# Patient Record
Sex: Female | Born: 1970 | Race: White | Hispanic: No | Marital: Married | Smoking: Current every day smoker
Health system: Southern US, Community
[De-identification: ages and names within clinical notes are randomized; demographics above are authoritative.]

## PROBLEM LIST (undated history)

## (undated) DIAGNOSIS — C801 Malignant (primary) neoplasm, unspecified: Secondary | ICD-10-CM

## (undated) DIAGNOSIS — Z87898 Personal history of other specified conditions: Secondary | ICD-10-CM

## (undated) DIAGNOSIS — F32A Depression, unspecified: Secondary | ICD-10-CM

## (undated) DIAGNOSIS — J441 Chronic obstructive pulmonary disease with (acute) exacerbation: Secondary | ICD-10-CM

## (undated) DIAGNOSIS — G378 Other specified demyelinating diseases of central nervous system: Secondary | ICD-10-CM

## (undated) DIAGNOSIS — B20 Human immunodeficiency virus [HIV] disease: Secondary | ICD-10-CM

## (undated) DIAGNOSIS — F4310 Post-traumatic stress disorder, unspecified: Secondary | ICD-10-CM

## (undated) DIAGNOSIS — B182 Chronic viral hepatitis C: Secondary | ICD-10-CM

## (undated) DIAGNOSIS — R0602 Shortness of breath: Secondary | ICD-10-CM

## (undated) DIAGNOSIS — T8859XA Other complications of anesthesia, initial encounter: Secondary | ICD-10-CM

## (undated) DIAGNOSIS — R112 Nausea with vomiting, unspecified: Secondary | ICD-10-CM

## (undated) DIAGNOSIS — F419 Anxiety disorder, unspecified: Secondary | ICD-10-CM

## (undated) DIAGNOSIS — F17200 Nicotine dependence, unspecified, uncomplicated: Secondary | ICD-10-CM

## (undated) DIAGNOSIS — Z9889 Other specified postprocedural states: Secondary | ICD-10-CM

## (undated) DIAGNOSIS — T4145XA Adverse effect of unspecified anesthetic, initial encounter: Secondary | ICD-10-CM

## (undated) DIAGNOSIS — F329 Major depressive disorder, single episode, unspecified: Secondary | ICD-10-CM

## (undated) HISTORY — PX: FOOT SURGERY: SHX648

## (undated) HISTORY — DX: Chronic obstructive pulmonary disease with (acute) exacerbation: J44.1

## (undated) HISTORY — DX: Nicotine dependence, unspecified, uncomplicated: F17.200

## (undated) HISTORY — PX: OTHER SURGICAL HISTORY: SHX169

## (undated) HISTORY — DX: Human immunodeficiency virus (HIV) disease: B20

## (undated) HISTORY — DX: Chronic viral hepatitis C: B18.2

## (undated) HISTORY — PX: VAGINA SURGERY: SHX829

## (undated) HISTORY — DX: Personal history of other specified conditions: Z87.898

---

## 1996-09-13 DIAGNOSIS — Z21 Asymptomatic human immunodeficiency virus [HIV] infection status: Secondary | ICD-10-CM

## 1996-09-13 DIAGNOSIS — B20 Human immunodeficiency virus [HIV] disease: Secondary | ICD-10-CM

## 1996-09-13 HISTORY — DX: Human immunodeficiency virus (HIV) disease: B20

## 1996-09-13 HISTORY — DX: Asymptomatic human immunodeficiency virus (hiv) infection status: Z21

## 1998-02-08 ENCOUNTER — Encounter (INDEPENDENT_AMBULATORY_CARE_PROVIDER_SITE_OTHER): Payer: Self-pay | Admitting: *Deleted

## 1998-02-08 LAB — CONVERTED CEMR LAB
CD4 Count: 49 microliters
CD4 T Cell Abs: 49

## 1998-02-27 ENCOUNTER — Encounter: Admission: RE | Admit: 1998-02-27 | Discharge: 1998-02-27 | Payer: Self-pay | Admitting: Infectious Diseases

## 1998-03-14 ENCOUNTER — Encounter: Admission: RE | Admit: 1998-03-14 | Discharge: 1998-03-14 | Payer: Self-pay | Admitting: Internal Medicine

## 1998-03-15 ENCOUNTER — Inpatient Hospital Stay (HOSPITAL_COMMUNITY): Admission: EM | Admit: 1998-03-15 | Discharge: 1998-03-18 | Payer: Self-pay | Admitting: Emergency Medicine

## 1998-03-24 ENCOUNTER — Encounter: Admission: RE | Admit: 1998-03-24 | Discharge: 1998-03-24 | Payer: Self-pay | Admitting: Hematology and Oncology

## 1998-03-31 ENCOUNTER — Encounter: Admission: RE | Admit: 1998-03-31 | Discharge: 1998-03-31 | Payer: Self-pay | Admitting: Infectious Diseases

## 1998-05-12 ENCOUNTER — Encounter: Admission: RE | Admit: 1998-05-12 | Discharge: 1998-05-12 | Payer: Self-pay | Admitting: Infectious Diseases

## 1998-05-30 ENCOUNTER — Encounter: Admission: RE | Admit: 1998-05-30 | Discharge: 1998-08-28 | Payer: Self-pay | Admitting: Dentistry

## 1998-06-23 ENCOUNTER — Ambulatory Visit (HOSPITAL_COMMUNITY): Admission: RE | Admit: 1998-06-23 | Discharge: 1998-06-23 | Payer: Self-pay | Admitting: Infectious Diseases

## 1998-07-07 ENCOUNTER — Encounter: Admission: RE | Admit: 1998-07-07 | Discharge: 1998-07-07 | Payer: Self-pay | Admitting: Infectious Diseases

## 1998-08-25 ENCOUNTER — Encounter: Admission: RE | Admit: 1998-08-25 | Discharge: 1998-08-25 | Payer: Self-pay | Admitting: Infectious Diseases

## 1998-09-13 HISTORY — PX: NECK SURGERY: SHX720

## 1998-10-17 ENCOUNTER — Encounter (HOSPITAL_COMMUNITY): Admission: RE | Admit: 1998-10-17 | Discharge: 1999-01-15 | Payer: Self-pay | Admitting: Dentistry

## 1998-10-20 ENCOUNTER — Ambulatory Visit (HOSPITAL_COMMUNITY): Admission: RE | Admit: 1998-10-20 | Discharge: 1998-10-20 | Payer: Self-pay | Admitting: Infectious Diseases

## 1998-11-05 ENCOUNTER — Encounter: Admission: RE | Admit: 1998-11-05 | Discharge: 1998-11-05 | Payer: Self-pay | Admitting: Infectious Diseases

## 1998-12-17 ENCOUNTER — Encounter: Admission: RE | Admit: 1998-12-17 | Discharge: 1998-12-17 | Payer: Self-pay | Admitting: Infectious Diseases

## 1998-12-17 ENCOUNTER — Ambulatory Visit (HOSPITAL_COMMUNITY): Admission: RE | Admit: 1998-12-17 | Discharge: 1998-12-17 | Payer: Self-pay | Admitting: Infectious Diseases

## 1999-01-19 ENCOUNTER — Inpatient Hospital Stay (HOSPITAL_COMMUNITY): Admission: RE | Admit: 1999-01-19 | Discharge: 1999-01-21 | Payer: Self-pay | Admitting: Infectious Diseases

## 1999-01-19 ENCOUNTER — Encounter: Admission: RE | Admit: 1999-01-19 | Discharge: 1999-01-19 | Payer: Self-pay | Admitting: Infectious Diseases

## 1999-01-19 ENCOUNTER — Encounter: Payer: Self-pay | Admitting: Infectious Diseases

## 1999-03-09 ENCOUNTER — Encounter: Admission: RE | Admit: 1999-03-09 | Discharge: 1999-03-09 | Payer: Self-pay | Admitting: Infectious Diseases

## 1999-03-09 ENCOUNTER — Ambulatory Visit (HOSPITAL_COMMUNITY): Admission: RE | Admit: 1999-03-09 | Discharge: 1999-03-09 | Payer: Self-pay | Admitting: Infectious Diseases

## 1999-04-13 ENCOUNTER — Encounter: Admission: RE | Admit: 1999-04-13 | Discharge: 1999-04-13 | Payer: Self-pay | Admitting: Infectious Diseases

## 1999-06-08 ENCOUNTER — Encounter: Admission: RE | Admit: 1999-06-08 | Discharge: 1999-06-08 | Payer: Self-pay | Admitting: Infectious Diseases

## 1999-06-08 ENCOUNTER — Ambulatory Visit (HOSPITAL_COMMUNITY): Admission: RE | Admit: 1999-06-08 | Discharge: 1999-06-08 | Payer: Self-pay | Admitting: Infectious Diseases

## 1999-07-08 ENCOUNTER — Encounter: Admission: RE | Admit: 1999-07-08 | Discharge: 1999-07-08 | Payer: Self-pay | Admitting: Infectious Diseases

## 1999-07-20 ENCOUNTER — Encounter: Admission: RE | Admit: 1999-07-20 | Discharge: 1999-07-20 | Payer: Self-pay | Admitting: Infectious Diseases

## 1999-08-12 ENCOUNTER — Encounter (INDEPENDENT_AMBULATORY_CARE_PROVIDER_SITE_OTHER): Payer: Self-pay | Admitting: *Deleted

## 1999-08-12 ENCOUNTER — Ambulatory Visit (HOSPITAL_BASED_OUTPATIENT_CLINIC_OR_DEPARTMENT_OTHER): Admission: RE | Admit: 1999-08-12 | Discharge: 1999-08-12 | Payer: Self-pay | Admitting: *Deleted

## 1999-08-31 ENCOUNTER — Encounter: Admission: RE | Admit: 1999-08-31 | Discharge: 1999-08-31 | Payer: Self-pay | Admitting: Infectious Diseases

## 1999-08-31 ENCOUNTER — Ambulatory Visit (HOSPITAL_COMMUNITY): Admission: RE | Admit: 1999-08-31 | Discharge: 1999-08-31 | Payer: Self-pay | Admitting: Infectious Diseases

## 1999-09-28 ENCOUNTER — Encounter: Admission: RE | Admit: 1999-09-28 | Discharge: 1999-09-28 | Payer: Self-pay | Admitting: Infectious Diseases

## 1999-11-03 ENCOUNTER — Ambulatory Visit (HOSPITAL_BASED_OUTPATIENT_CLINIC_OR_DEPARTMENT_OTHER): Admission: RE | Admit: 1999-11-03 | Discharge: 1999-11-03 | Payer: Self-pay | Admitting: *Deleted

## 1999-11-03 ENCOUNTER — Encounter (INDEPENDENT_AMBULATORY_CARE_PROVIDER_SITE_OTHER): Payer: Self-pay | Admitting: *Deleted

## 1999-12-23 ENCOUNTER — Ambulatory Visit (HOSPITAL_COMMUNITY): Admission: RE | Admit: 1999-12-23 | Discharge: 1999-12-23 | Payer: Self-pay | Admitting: Infectious Diseases

## 1999-12-23 ENCOUNTER — Encounter: Admission: RE | Admit: 1999-12-23 | Discharge: 1999-12-23 | Payer: Self-pay | Admitting: Infectious Diseases

## 2000-01-06 ENCOUNTER — Encounter: Admission: RE | Admit: 2000-01-06 | Discharge: 2000-01-06 | Payer: Self-pay | Admitting: Infectious Diseases

## 2000-03-14 ENCOUNTER — Encounter: Admission: RE | Admit: 2000-03-14 | Discharge: 2000-03-14 | Payer: Self-pay | Admitting: Infectious Diseases

## 2000-03-14 ENCOUNTER — Ambulatory Visit (HOSPITAL_COMMUNITY): Admission: RE | Admit: 2000-03-14 | Discharge: 2000-03-14 | Payer: Self-pay | Admitting: Infectious Diseases

## 2000-03-30 ENCOUNTER — Encounter: Admission: RE | Admit: 2000-03-30 | Discharge: 2000-03-30 | Payer: Self-pay | Admitting: Infectious Diseases

## 2000-06-13 ENCOUNTER — Ambulatory Visit (HOSPITAL_COMMUNITY): Admission: RE | Admit: 2000-06-13 | Discharge: 2000-06-13 | Payer: Self-pay | Admitting: Infectious Diseases

## 2000-06-13 ENCOUNTER — Encounter: Admission: RE | Admit: 2000-06-13 | Discharge: 2000-06-13 | Payer: Self-pay | Admitting: Infectious Diseases

## 2000-09-19 ENCOUNTER — Encounter: Admission: RE | Admit: 2000-09-19 | Discharge: 2000-09-19 | Payer: Self-pay | Admitting: Infectious Diseases

## 2000-09-19 ENCOUNTER — Ambulatory Visit (HOSPITAL_COMMUNITY): Admission: RE | Admit: 2000-09-19 | Discharge: 2000-09-19 | Payer: Self-pay | Admitting: Infectious Diseases

## 2000-10-03 ENCOUNTER — Encounter: Admission: RE | Admit: 2000-10-03 | Discharge: 2000-10-03 | Payer: Self-pay | Admitting: Infectious Diseases

## 2000-11-22 ENCOUNTER — Encounter: Admission: RE | Admit: 2000-11-22 | Discharge: 2000-11-22 | Payer: Self-pay | Admitting: Internal Medicine

## 2001-01-16 ENCOUNTER — Encounter: Admission: RE | Admit: 2001-01-16 | Discharge: 2001-01-16 | Payer: Self-pay | Admitting: Internal Medicine

## 2001-03-06 ENCOUNTER — Ambulatory Visit (HOSPITAL_COMMUNITY): Admission: RE | Admit: 2001-03-06 | Discharge: 2001-03-06 | Payer: Self-pay | Admitting: Infectious Diseases

## 2001-03-06 ENCOUNTER — Encounter: Admission: RE | Admit: 2001-03-06 | Discharge: 2001-03-06 | Payer: Self-pay | Admitting: Internal Medicine

## 2001-03-20 ENCOUNTER — Encounter: Admission: RE | Admit: 2001-03-20 | Discharge: 2001-03-20 | Payer: Self-pay | Admitting: Infectious Diseases

## 2001-04-17 ENCOUNTER — Ambulatory Visit (HOSPITAL_COMMUNITY): Admission: RE | Admit: 2001-04-17 | Discharge: 2001-04-17 | Payer: Self-pay | Admitting: Infectious Diseases

## 2001-04-17 ENCOUNTER — Encounter: Admission: RE | Admit: 2001-04-17 | Discharge: 2001-04-17 | Payer: Self-pay | Admitting: Infectious Diseases

## 2001-05-01 ENCOUNTER — Encounter: Admission: RE | Admit: 2001-05-01 | Discharge: 2001-05-01 | Payer: Self-pay | Admitting: Infectious Diseases

## 2001-06-29 ENCOUNTER — Ambulatory Visit (HOSPITAL_COMMUNITY): Admission: RE | Admit: 2001-06-29 | Discharge: 2001-06-29 | Payer: Self-pay | Admitting: Infectious Diseases

## 2001-06-29 ENCOUNTER — Encounter: Admission: RE | Admit: 2001-06-29 | Discharge: 2001-06-29 | Payer: Self-pay | Admitting: Infectious Diseases

## 2001-07-31 ENCOUNTER — Encounter: Admission: RE | Admit: 2001-07-31 | Discharge: 2001-07-31 | Payer: Self-pay | Admitting: Infectious Diseases

## 2001-12-05 ENCOUNTER — Encounter: Admission: RE | Admit: 2001-12-05 | Discharge: 2001-12-05 | Payer: Self-pay | Admitting: Infectious Diseases

## 2001-12-05 ENCOUNTER — Ambulatory Visit (HOSPITAL_COMMUNITY): Admission: RE | Admit: 2001-12-05 | Discharge: 2001-12-05 | Payer: Self-pay | Admitting: Infectious Diseases

## 2002-04-23 ENCOUNTER — Encounter: Admission: RE | Admit: 2002-04-23 | Discharge: 2002-04-23 | Payer: Self-pay | Admitting: Internal Medicine

## 2002-04-23 ENCOUNTER — Ambulatory Visit (HOSPITAL_COMMUNITY): Admission: RE | Admit: 2002-04-23 | Discharge: 2002-04-23 | Payer: Self-pay | Admitting: Internal Medicine

## 2002-07-26 ENCOUNTER — Encounter: Admission: RE | Admit: 2002-07-26 | Discharge: 2002-07-26 | Payer: Self-pay | Admitting: Internal Medicine

## 2002-07-26 ENCOUNTER — Ambulatory Visit (HOSPITAL_COMMUNITY): Admission: RE | Admit: 2002-07-26 | Discharge: 2002-07-26 | Payer: Self-pay | Admitting: Infectious Diseases

## 2002-08-27 ENCOUNTER — Encounter: Admission: RE | Admit: 2002-08-27 | Discharge: 2002-08-27 | Payer: Self-pay | Admitting: Infectious Diseases

## 2002-10-01 ENCOUNTER — Encounter: Admission: RE | Admit: 2002-10-01 | Discharge: 2002-10-01 | Payer: Self-pay | Admitting: Infectious Diseases

## 2002-12-10 ENCOUNTER — Encounter: Admission: RE | Admit: 2002-12-10 | Discharge: 2002-12-10 | Payer: Self-pay | Admitting: Infectious Diseases

## 2003-05-15 ENCOUNTER — Encounter: Admission: RE | Admit: 2003-05-15 | Discharge: 2003-05-15 | Payer: Self-pay | Admitting: Infectious Diseases

## 2003-05-15 ENCOUNTER — Encounter (INDEPENDENT_AMBULATORY_CARE_PROVIDER_SITE_OTHER): Payer: Self-pay | Admitting: Specialist

## 2003-05-15 ENCOUNTER — Ambulatory Visit (HOSPITAL_COMMUNITY): Admission: RE | Admit: 2003-05-15 | Discharge: 2003-05-15 | Payer: Self-pay | Admitting: Infectious Diseases

## 2003-06-17 ENCOUNTER — Encounter: Admission: RE | Admit: 2003-06-17 | Discharge: 2003-06-17 | Payer: Self-pay | Admitting: Infectious Diseases

## 2003-07-18 ENCOUNTER — Encounter: Admission: RE | Admit: 2003-07-18 | Discharge: 2003-07-18 | Payer: Self-pay | Admitting: Infectious Diseases

## 2003-07-22 ENCOUNTER — Ambulatory Visit (HOSPITAL_COMMUNITY): Admission: RE | Admit: 2003-07-22 | Discharge: 2003-07-22 | Payer: Self-pay | Admitting: Infectious Diseases

## 2003-08-12 ENCOUNTER — Encounter: Admission: RE | Admit: 2003-08-12 | Discharge: 2003-08-12 | Payer: Self-pay | Admitting: Infectious Diseases

## 2003-10-16 ENCOUNTER — Encounter: Admission: RE | Admit: 2003-10-16 | Discharge: 2003-10-16 | Payer: Self-pay | Admitting: Infectious Diseases

## 2003-12-04 ENCOUNTER — Encounter: Admission: RE | Admit: 2003-12-04 | Discharge: 2003-12-04 | Payer: Self-pay | Admitting: Infectious Diseases

## 2004-02-27 ENCOUNTER — Inpatient Hospital Stay (HOSPITAL_COMMUNITY): Admission: RE | Admit: 2004-02-27 | Discharge: 2004-02-29 | Payer: Self-pay | Admitting: Psychiatry

## 2004-03-30 ENCOUNTER — Ambulatory Visit (HOSPITAL_COMMUNITY): Admission: RE | Admit: 2004-03-30 | Discharge: 2004-03-30 | Payer: Self-pay | Admitting: Infectious Diseases

## 2004-03-30 ENCOUNTER — Encounter: Admission: RE | Admit: 2004-03-30 | Discharge: 2004-03-30 | Payer: Self-pay | Admitting: Infectious Diseases

## 2004-03-30 ENCOUNTER — Encounter (INDEPENDENT_AMBULATORY_CARE_PROVIDER_SITE_OTHER): Payer: Self-pay | Admitting: *Deleted

## 2004-03-30 LAB — CONVERTED CEMR LAB
CD4 Count: 380 microliters
HIV 1 RNA Quant: 49 copies/mL

## 2005-03-08 ENCOUNTER — Ambulatory Visit: Payer: Self-pay | Admitting: Infectious Diseases

## 2005-03-08 ENCOUNTER — Ambulatory Visit (HOSPITAL_COMMUNITY): Admission: RE | Admit: 2005-03-08 | Discharge: 2005-03-08 | Payer: Self-pay | Admitting: Infectious Diseases

## 2005-03-08 ENCOUNTER — Encounter (INDEPENDENT_AMBULATORY_CARE_PROVIDER_SITE_OTHER): Payer: Self-pay | Admitting: *Deleted

## 2005-03-08 LAB — CONVERTED CEMR LAB
CD4 Count: 300 microliters
HIV 1 RNA Quant: 41400 copies/mL

## 2006-09-19 ENCOUNTER — Encounter: Admission: RE | Admit: 2006-09-19 | Discharge: 2006-09-19 | Payer: Self-pay | Admitting: Infectious Diseases

## 2006-09-19 ENCOUNTER — Encounter (INDEPENDENT_AMBULATORY_CARE_PROVIDER_SITE_OTHER): Payer: Self-pay | Admitting: *Deleted

## 2006-09-19 ENCOUNTER — Ambulatory Visit: Payer: Self-pay | Admitting: Infectious Diseases

## 2006-09-19 LAB — CONVERTED CEMR LAB
ALT: 51 units/L — ABNORMAL HIGH (ref 0–35)
AST: 42 units/L — ABNORMAL HIGH (ref 0–37)
Albumin: 3.9 g/dL (ref 3.5–5.2)
Alkaline Phosphatase: 56 units/L (ref 39–117)
BUN: 9 mg/dL (ref 6–23)
Basophils Absolute: 0 10*3/uL (ref 0.0–0.1)
Basophils Relative: 0 % (ref 0–1)
Bilirubin Urine: NEGATIVE
CD4 Count: 100 microliters
CO2: 24 meq/L (ref 19–32)
Calcium: 9.6 mg/dL (ref 8.4–10.5)
Chloride: 105 meq/L (ref 96–112)
Cholesterol: 160 mg/dL (ref 0–200)
Creatinine, Ser: 0.64 mg/dL (ref 0.40–1.20)
Eosinophils Relative: 1 % (ref 0–5)
Glucose, Bld: 92 mg/dL (ref 70–99)
HCT: 43.7 % (ref 36.0–46.0)
HDL: 42 mg/dL (ref >39)
HIV 1 RNA Quant: 59700 copies/mL
HIV 1 RNA Quant: 59700 copies/mL — ABNORMAL HIGH (ref <50)
HIV-1 RNA Quant, Log: 4.78 — ABNORMAL HIGH (ref <1.70)
Hemoglobin, Urine: NEGATIVE
Hemoglobin: 14.1 g/dL (ref 12.0–15.0)
Ketones, ur: NEGATIVE mg/dL
LDL Cholesterol: 94 mg/dL (ref 0–99)
Leukocytes, UA: NEGATIVE
Lymphocytes Relative: 38 % (ref 12–46)
Lymphs Abs: 2 10*3/uL (ref 0.7–3.3)
MCHC: 32.3 g/dL (ref 30.0–36.0)
MCV: 101.6 fL — ABNORMAL HIGH (ref 78.0–100.0)
Monocytes Absolute: 0.4 10*3/uL (ref 0.2–0.7)
Monocytes Relative: 8 % (ref 3–11)
Neutro Abs: 2.8 10*3/uL (ref 1.7–7.7)
Neutrophils Relative %: 53 % (ref 43–77)
Nitrite: NEGATIVE
Platelets: 249 10*3/uL (ref 150–400)
Potassium: 4.1 meq/L (ref 3.5–5.3)
Protein, ur: NEGATIVE mg/dL
RBC: 4.3 M/uL (ref 3.87–5.11)
RDW: 13.2 % (ref 11.5–14.0)
Sodium: 138 meq/L (ref 135–145)
Specific Gravity, Urine: 1.016 (ref 1.005–1.03)
Total Bilirubin: 0.5 mg/dL (ref 0.3–1.2)
Total CHOL/HDL Ratio: 3.8
Total Protein: 7.4 g/dL (ref 6.0–8.3)
Triglycerides: 120 mg/dL (ref <150)
Urine Glucose: NEGATIVE mg/dL
Urobilinogen, UA: 0.2 (ref 0.0–1.0)
VLDL: 24 mg/dL (ref 0–40)
WBC: 5.2 10*3/uL (ref 4.0–10.5)
pH: 6 (ref 5.0–8.0)

## 2006-10-05 ENCOUNTER — Ambulatory Visit: Payer: Self-pay | Admitting: Internal Medicine

## 2006-10-18 DIAGNOSIS — A158 Other respiratory tuberculosis: Secondary | ICD-10-CM | POA: Insufficient documentation

## 2006-10-19 DIAGNOSIS — L0293 Carbuncle, unspecified: Secondary | ICD-10-CM

## 2006-10-19 DIAGNOSIS — L0292 Furuncle, unspecified: Secondary | ICD-10-CM | POA: Insufficient documentation

## 2006-10-19 DIAGNOSIS — B20 Human immunodeficiency virus [HIV] disease: Secondary | ICD-10-CM | POA: Insufficient documentation

## 2006-10-21 DIAGNOSIS — F339 Major depressive disorder, recurrent, unspecified: Secondary | ICD-10-CM | POA: Insufficient documentation

## 2006-10-24 ENCOUNTER — Encounter (INDEPENDENT_AMBULATORY_CARE_PROVIDER_SITE_OTHER): Payer: Self-pay | Admitting: Infectious Diseases

## 2006-11-07 ENCOUNTER — Encounter (INDEPENDENT_AMBULATORY_CARE_PROVIDER_SITE_OTHER): Payer: Self-pay | Admitting: *Deleted

## 2006-11-07 LAB — CONVERTED CEMR LAB
HCV Quantitative: 451000 intl units/mL
Pap Smear: ABNORMAL

## 2006-11-09 ENCOUNTER — Encounter: Admission: RE | Admit: 2006-11-09 | Discharge: 2006-11-09 | Payer: Self-pay | Admitting: Internal Medicine

## 2006-11-09 ENCOUNTER — Telehealth: Payer: Self-pay

## 2006-11-09 ENCOUNTER — Ambulatory Visit: Payer: Self-pay | Admitting: Internal Medicine

## 2006-11-11 ENCOUNTER — Inpatient Hospital Stay (HOSPITAL_COMMUNITY): Admission: RE | Admit: 2006-11-11 | Discharge: 2006-11-15 | Payer: Self-pay | Admitting: Psychiatry

## 2006-11-11 ENCOUNTER — Ambulatory Visit: Payer: Self-pay | Admitting: Psychiatry

## 2006-11-20 ENCOUNTER — Encounter (INDEPENDENT_AMBULATORY_CARE_PROVIDER_SITE_OTHER): Payer: Self-pay | Admitting: *Deleted

## 2006-12-21 ENCOUNTER — Ambulatory Visit: Payer: Self-pay | Admitting: Internal Medicine

## 2006-12-21 DIAGNOSIS — N920 Excessive and frequent menstruation with regular cycle: Secondary | ICD-10-CM | POA: Insufficient documentation

## 2006-12-21 DIAGNOSIS — R350 Frequency of micturition: Secondary | ICD-10-CM | POA: Insufficient documentation

## 2006-12-21 DIAGNOSIS — D219 Benign neoplasm of connective and other soft tissue, unspecified: Secondary | ICD-10-CM | POA: Insufficient documentation

## 2007-01-19 ENCOUNTER — Telehealth (INDEPENDENT_AMBULATORY_CARE_PROVIDER_SITE_OTHER): Payer: Self-pay | Admitting: *Deleted

## 2007-02-08 ENCOUNTER — Encounter: Payer: Self-pay | Admitting: Internal Medicine

## 2007-02-08 ENCOUNTER — Encounter: Payer: Self-pay | Admitting: Obstetrics and Gynecology

## 2007-02-08 ENCOUNTER — Ambulatory Visit: Payer: Self-pay | Admitting: Obstetrics and Gynecology

## 2007-02-08 ENCOUNTER — Encounter (INDEPENDENT_AMBULATORY_CARE_PROVIDER_SITE_OTHER): Payer: Self-pay | Admitting: *Deleted

## 2007-02-08 LAB — CONVERTED CEMR LAB: Pap Smear: ABNORMAL

## 2007-02-22 ENCOUNTER — Telehealth: Payer: Self-pay | Admitting: Internal Medicine

## 2007-02-27 ENCOUNTER — Ambulatory Visit (HOSPITAL_COMMUNITY): Admission: RE | Admit: 2007-02-27 | Discharge: 2007-02-27 | Payer: Self-pay | Admitting: Internal Medicine

## 2007-04-18 ENCOUNTER — Telehealth: Payer: Self-pay | Admitting: Internal Medicine

## 2007-04-18 ENCOUNTER — Encounter: Payer: Self-pay | Admitting: Internal Medicine

## 2007-04-24 ENCOUNTER — Encounter: Admission: RE | Admit: 2007-04-24 | Discharge: 2007-04-24 | Payer: Self-pay | Admitting: Internal Medicine

## 2007-04-24 ENCOUNTER — Ambulatory Visit: Payer: Self-pay | Admitting: Internal Medicine

## 2007-04-24 LAB — CONVERTED CEMR LAB
ALT: 38 units/L — ABNORMAL HIGH (ref 0–35)
AST: 32 units/L (ref 0–37)
Albumin: 3.9 g/dL (ref 3.5–5.2)
Alkaline Phosphatase: 107 units/L (ref 39–117)
BUN: 10 mg/dL (ref 6–23)
Basophils Absolute: 0 10*3/uL (ref 0.0–0.1)
Basophils Relative: 0 % (ref 0–1)
CO2: 23 meq/L (ref 19–32)
Calcium: 8.6 mg/dL (ref 8.4–10.5)
Chloride: 108 meq/L (ref 96–112)
Creatinine, Ser: 0.66 mg/dL (ref 0.40–1.20)
Eosinophils Absolute: 0.1 10*3/uL (ref 0.0–0.7)
Eosinophils Relative: 2 % (ref 0–5)
Glucose, Bld: 94 mg/dL (ref 70–99)
HCT: 46.5 % — ABNORMAL HIGH (ref 36.0–46.0)
HIV 1 RNA Quant: <50 copies/mL (ref <50)
HIV-1 RNA Quant, Log: <1.7 (ref <1.70)
Hemoglobin: 15.6 g/dL — ABNORMAL HIGH (ref 12.0–15.0)
Lymphocytes Relative: 39 % (ref 12–46)
Lymphs Abs: 2.2 10*3/uL (ref 0.7–3.3)
MCHC: 33.5 g/dL (ref 30.0–36.0)
MCV: 103.3 fL — ABNORMAL HIGH (ref 78.0–100.0)
Monocytes Absolute: 0.5 10*3/uL (ref 0.2–0.7)
Monocytes Relative: 9 % (ref 3–11)
Neutro Abs: 2.9 10*3/uL (ref 1.7–7.7)
Neutrophils Relative %: 51 % (ref 43–77)
Platelets: 390 10*3/uL (ref 150–400)
Potassium: 4.4 meq/L (ref 3.5–5.3)
RBC: 4.5 M/uL (ref 3.87–5.11)
RDW: 13.4 % (ref 11.5–14.0)
Sodium: 139 meq/L (ref 135–145)
Total Bilirubin: 0.3 mg/dL (ref 0.3–1.2)
Total Protein: 7.4 g/dL (ref 6.0–8.3)
WBC: 5.7 10*3/uL (ref 4.0–10.5)

## 2007-04-28 ENCOUNTER — Encounter (INDEPENDENT_AMBULATORY_CARE_PROVIDER_SITE_OTHER): Payer: Self-pay | Admitting: *Deleted

## 2007-04-28 ENCOUNTER — Telehealth: Payer: Self-pay | Admitting: Internal Medicine

## 2007-05-03 ENCOUNTER — Ambulatory Visit: Payer: Self-pay | Admitting: Internal Medicine

## 2007-05-03 ENCOUNTER — Telehealth: Payer: Self-pay | Admitting: Internal Medicine

## 2007-05-29 ENCOUNTER — Ambulatory Visit (HOSPITAL_COMMUNITY): Admission: RE | Admit: 2007-05-29 | Discharge: 2007-05-29 | Payer: Self-pay | Admitting: Obstetrics and Gynecology

## 2007-05-29 ENCOUNTER — Ambulatory Visit: Payer: Self-pay | Admitting: Obstetrics and Gynecology

## 2007-07-17 ENCOUNTER — Encounter: Admission: RE | Admit: 2007-07-17 | Discharge: 2007-07-17 | Payer: Self-pay | Admitting: Infectious Diseases

## 2007-07-17 ENCOUNTER — Encounter: Payer: Self-pay | Admitting: Internal Medicine

## 2007-07-17 ENCOUNTER — Ambulatory Visit: Payer: Self-pay | Admitting: Infectious Diseases

## 2007-07-17 LAB — CONVERTED CEMR LAB
HIV 1 RNA Quant: <50 copies/mL (ref <50)
HIV-1 RNA Quant, Log: <1.7 (ref <1.70)

## 2007-07-18 ENCOUNTER — Encounter: Payer: Self-pay | Admitting: Internal Medicine

## 2007-07-18 LAB — CONVERTED CEMR LAB
ALT: 30 units/L (ref 0–35)
AST: 28 units/L (ref 0–37)
Albumin: 4.1 g/dL (ref 3.5–5.2)
Alkaline Phosphatase: 108 units/L (ref 39–117)
BUN: 8 mg/dL (ref 6–23)
Basophils Absolute: 0 10*3/uL (ref 0.0–0.1)
Basophils Relative: 0 % (ref 0–1)
CO2: 20 meq/L (ref 19–32)
Calcium: 9.4 mg/dL (ref 8.4–10.5)
Chloride: 110 meq/L (ref 96–112)
Creatinine, Ser: 0.69 mg/dL (ref 0.40–1.20)
Eosinophils Absolute: 0.1 10*3/uL (ref 0.0–0.7)
Eosinophils Relative: 1 % (ref 0–5)
Glucose, Bld: 81 mg/dL (ref 70–99)
HCT: 44.9 % (ref 36.0–46.0)
Hemoglobin: 14.8 g/dL (ref 12.0–15.0)
Lymphocytes Relative: 31 % (ref 12–46)
Lymphs Abs: 2.2 10*3/uL (ref 0.7–3.3)
MCHC: 33 g/dL (ref 30.0–36.0)
MCV: 106.1 fL — ABNORMAL HIGH (ref 78.0–100.0)
Monocytes Absolute: 0.6 10*3/uL (ref 0.2–0.7)
Monocytes Relative: 8 % (ref 3–11)
Neutro Abs: 4.1 10*3/uL (ref 1.7–7.7)
Neutrophils Relative %: 59 % (ref 43–77)
Platelets: 376 10*3/uL (ref 150–400)
Potassium: 4.1 meq/L (ref 3.5–5.3)
RBC: 4.23 M/uL (ref 3.87–5.11)
RDW: 14.4 % — ABNORMAL HIGH (ref 11.5–14.0)
Sodium: 145 meq/L (ref 135–145)
Total Bilirubin: 0.3 mg/dL (ref 0.3–1.2)
Total Protein: 7.4 g/dL (ref 6.0–8.3)
WBC: 6.9 10*3/uL (ref 4.0–10.5)

## 2007-08-28 ENCOUNTER — Encounter (INDEPENDENT_AMBULATORY_CARE_PROVIDER_SITE_OTHER): Payer: Self-pay | Admitting: *Deleted

## 2007-09-12 ENCOUNTER — Encounter: Payer: Self-pay | Admitting: Internal Medicine

## 2007-10-11 ENCOUNTER — Telehealth: Payer: Self-pay | Admitting: Internal Medicine

## 2007-10-23 ENCOUNTER — Encounter: Admission: RE | Admit: 2007-10-23 | Discharge: 2007-10-23 | Payer: Self-pay | Admitting: Internal Medicine

## 2007-10-23 ENCOUNTER — Ambulatory Visit: Payer: Self-pay | Admitting: Internal Medicine

## 2007-10-23 LAB — CONVERTED CEMR LAB
ALT: 22 units/L (ref 0–35)
AST: 21 units/L (ref 0–37)
Albumin: 4 g/dL (ref 3.5–5.2)
Alkaline Phosphatase: 112 units/L (ref 39–117)
BUN: 8 mg/dL (ref 6–23)
Basophils Absolute: 0 10*3/uL (ref 0.0–0.1)
Basophils Relative: 0 % (ref 0–1)
CO2: 22 meq/L (ref 19–32)
Calcium: 9.3 mg/dL (ref 8.4–10.5)
Chloride: 106 meq/L (ref 96–112)
Creatinine, Ser: 0.68 mg/dL (ref 0.40–1.20)
Eosinophils Absolute: 0.1 10*3/uL (ref 0.0–0.7)
Eosinophils Relative: 1 % (ref 0–5)
Glucose, Bld: 82 mg/dL (ref 70–99)
HCT: 45.4 % (ref 36.0–46.0)
HIV 1 RNA Quant: <50 copies/mL (ref <50)
HIV-1 RNA Quant, Log: <1.7 (ref <1.70)
Hemoglobin: 14.9 g/dL (ref 12.0–15.0)
Lymphocytes Relative: 34 % (ref 12–46)
Lymphs Abs: 2.1 10*3/uL (ref 0.7–4.0)
MCHC: 32.8 g/dL (ref 30.0–36.0)
MCV: 104.4 fL — ABNORMAL HIGH (ref 78.0–100.0)
Monocytes Absolute: 0.6 10*3/uL (ref 0.1–1.0)
Monocytes Relative: 10 % (ref 3–12)
Neutro Abs: 3.5 10*3/uL (ref 1.7–7.7)
Neutrophils Relative %: 55 % (ref 43–77)
Platelets: 371 10*3/uL (ref 150–400)
Potassium: 4.5 meq/L (ref 3.5–5.3)
RBC: 4.35 M/uL (ref 3.87–5.11)
RDW: 13.6 % (ref 11.5–15.5)
Sodium: 139 meq/L (ref 135–145)
Total Bilirubin: 0.3 mg/dL (ref 0.3–1.2)
Total Protein: 7.3 g/dL (ref 6.0–8.3)
WBC: 6.4 10*3/uL (ref 4.0–10.5)

## 2007-11-22 ENCOUNTER — Ambulatory Visit: Payer: Self-pay | Admitting: Internal Medicine

## 2007-11-22 DIAGNOSIS — F1910 Other psychoactive substance abuse, uncomplicated: Secondary | ICD-10-CM | POA: Insufficient documentation

## 2007-11-22 DIAGNOSIS — K029 Dental caries, unspecified: Secondary | ICD-10-CM | POA: Insufficient documentation

## 2007-12-18 ENCOUNTER — Telehealth: Payer: Self-pay | Admitting: Internal Medicine

## 2008-03-11 ENCOUNTER — Encounter: Payer: Self-pay | Admitting: Internal Medicine

## 2008-03-11 ENCOUNTER — Telehealth: Payer: Self-pay

## 2008-06-03 ENCOUNTER — Encounter: Payer: Self-pay | Admitting: Internal Medicine

## 2010-08-03 ENCOUNTER — Ambulatory Visit: Payer: Self-pay | Admitting: Internal Medicine

## 2010-08-03 LAB — CONVERTED CEMR LAB
ALT: 43 units/L — ABNORMAL HIGH (ref 0–35)
AST: 36 units/L (ref 0–37)
Albumin: 3.7 g/dL (ref 3.5–5.2)
Alkaline Phosphatase: 74 units/L (ref 39–117)
BUN: 13 mg/dL (ref 6–23)
Basophils Absolute: 0 10*3/uL (ref 0.0–0.1)
Basophils Relative: 0 % (ref 0–1)
CO2: 26 meq/L (ref 19–32)
Calcium: 9.4 mg/dL (ref 8.4–10.5)
Chlamydia, Swab/Urine, PCR: NEGATIVE
Chloride: 103 meq/L (ref 96–112)
Creatinine, Ser: 0.96 mg/dL (ref 0.40–1.20)
Eosinophils Absolute: 0.1 10*3/uL (ref 0.0–0.7)
Eosinophils Relative: 1 % (ref 0–5)
GC Probe Amp, Urine: NEGATIVE
Glucose, Bld: 103 mg/dL — ABNORMAL HIGH (ref 70–99)
HCT: 39.9 % (ref 36.0–46.0)
HIV 1 RNA Quant: 79000 copies/mL — ABNORMAL HIGH (ref <20)
HIV-1 RNA Quant, Log: 4.9 — ABNORMAL HIGH (ref <1.30)
Hemoglobin: 13.2 g/dL (ref 12.0–15.0)
Lymphocytes Relative: 34 % (ref 12–46)
Lymphs Abs: 2.8 10*3/uL (ref 0.7–4.0)
MCHC: 33.1 g/dL (ref 30.0–36.0)
MCV: 102.8 fL — ABNORMAL HIGH (ref 78.0–100.0)
Monocytes Absolute: 0.8 10*3/uL (ref 0.1–1.0)
Monocytes Relative: 10 % (ref 3–12)
Neutro Abs: 4.6 10*3/uL (ref 1.7–7.7)
Neutrophils Relative %: 56 % (ref 43–77)
Platelets: 231 10*3/uL (ref 150–400)
Potassium: 4.3 meq/L (ref 3.5–5.3)
RBC: 3.88 M/uL (ref 3.87–5.11)
RDW: 13.6 % (ref 11.5–15.5)
Sodium: 139 meq/L (ref 135–145)
Total Bilirubin: 0.4 mg/dL (ref 0.3–1.2)
Total Protein: 6.7 g/dL (ref 6.0–8.3)
WBC: 8.3 10*3/uL (ref 4.0–10.5)

## 2010-08-11 ENCOUNTER — Encounter (INDEPENDENT_AMBULATORY_CARE_PROVIDER_SITE_OTHER): Payer: Self-pay | Admitting: *Deleted

## 2010-08-24 ENCOUNTER — Ambulatory Visit: Payer: Self-pay | Admitting: Internal Medicine

## 2010-08-24 DIAGNOSIS — J209 Acute bronchitis, unspecified: Secondary | ICD-10-CM | POA: Insufficient documentation

## 2010-09-09 ENCOUNTER — Encounter: Payer: Self-pay | Admitting: Internal Medicine

## 2010-10-03 ENCOUNTER — Encounter: Payer: Self-pay | Admitting: Infectious Diseases

## 2010-10-04 ENCOUNTER — Encounter: Payer: Self-pay | Admitting: *Deleted

## 2010-10-05 ENCOUNTER — Ambulatory Visit: Admit: 2010-10-05 | Payer: Self-pay | Admitting: Internal Medicine

## 2010-10-08 ENCOUNTER — Encounter (INDEPENDENT_AMBULATORY_CARE_PROVIDER_SITE_OTHER): Payer: Self-pay | Admitting: *Deleted

## 2010-10-11 LAB — CONVERTED CEMR LAB
ALT: 53 units/L — ABNORMAL HIGH (ref 0–35)
AST: 36 units/L (ref 0–37)
Albumin: 4.5 g/dL (ref 3.5–5.2)
Alkaline Phosphatase: 70 units/L (ref 39–117)
BUN: 13 mg/dL (ref 6–23)
Basophils Absolute: 0 10*3/uL (ref 0.0–0.1)
Basophils Relative: 0 % (ref 0–1)
Bilirubin Urine: NEGATIVE
CD4 % Helper T Cell: 6 %
CD4 % Helper T Cell: 6 %
CD4 Count: 130 microliters
CD4 Count: 130 microliters
CO2: 22 meq/L (ref 19–32)
Calcium: 9.7 mg/dL (ref 8.4–10.5)
Chloride: 102 meq/L (ref 96–112)
Creatinine, Ser: 0.75 mg/dL (ref 0.40–1.20)
Eosinophils Absolute: 0 10*3/uL (ref 0.0–0.7)
Eosinophils Relative: 1 % (ref 0–5)
Glucose, Bld: 124 mg/dL — ABNORMAL HIGH (ref 70–99)
HCT: 45.8 % (ref 36.0–46.0)
HIV 1 RNA Quant: 176 copies/mL — ABNORMAL HIGH (ref <50)
HIV-1 RNA Quant, Log: 2.25 — ABNORMAL HIGH (ref <1.70)
Hemoglobin, Urine: NEGATIVE
Hemoglobin: 15.4 g/dL — ABNORMAL HIGH (ref 12.0–15.0)
Ketones, ur: NEGATIVE mg/dL
Lymphocytes Relative: 41 % (ref 12–46)
Lymphs Abs: 2.2 10*3/uL (ref 0.7–3.3)
MCHC: 33.6 g/dL (ref 30.0–36.0)
MCV: 99.1 fL (ref 78.0–100.0)
Monocytes Absolute: 0.4 10*3/uL (ref 0.2–0.7)
Monocytes Relative: 8 % (ref 3–11)
Neutro Abs: 2.7 10*3/uL (ref 1.7–7.7)
Neutrophils Relative %: 50 % (ref 43–77)
Nitrite: NEGATIVE
Platelets: 344 10*3/uL (ref 150–400)
Potassium: 4.3 meq/L (ref 3.5–5.3)
Protein, ur: NEGATIVE mg/dL
RBC / HPF: NONE SEEN (ref <3)
RBC: 4.62 M/uL (ref 3.87–5.11)
RDW: 14.5 % — ABNORMAL HIGH (ref 11.5–14.0)
Sodium: 137 meq/L (ref 135–145)
Specific Gravity, Urine: 1.009 (ref 1.005–1.03)
Total Bilirubin: 0.3 mg/dL (ref 0.3–1.2)
Total Protein: 8.4 g/dL — ABNORMAL HIGH (ref 6.0–8.3)
Urine Glucose: NEGATIVE mg/dL
Urobilinogen, UA: 0.2 (ref 0.0–1.0)
WBC, UA: NONE SEEN cells/hpf (ref <3)
WBC: 5.3 10*3/uL (ref 4.0–10.5)
pH: 7 (ref 5.0–8.0)

## 2010-10-12 ENCOUNTER — Ambulatory Visit: Admit: 2010-10-12 | Payer: Self-pay | Admitting: Internal Medicine

## 2010-10-13 ENCOUNTER — Encounter: Payer: Self-pay | Admitting: Internal Medicine

## 2010-10-13 ENCOUNTER — Ambulatory Visit
Admission: RE | Admit: 2010-10-13 | Discharge: 2010-10-13 | Payer: Self-pay | Source: Home / Self Care | Attending: Internal Medicine | Admitting: Internal Medicine

## 2010-10-13 ENCOUNTER — Encounter (INDEPENDENT_AMBULATORY_CARE_PROVIDER_SITE_OTHER): Payer: Self-pay | Admitting: *Deleted

## 2010-10-13 LAB — CONVERTED CEMR LAB
ALT: 29 units/L (ref 0–35)
AST: 25 units/L (ref 0–37)
Albumin: 3.9 g/dL (ref 3.5–5.2)
Alkaline Phosphatase: 80 units/L (ref 39–117)
BUN: 12 mg/dL (ref 6–23)
Basophils Absolute: 0 10*3/uL (ref 0.0–0.1)
Basophils Relative: 0 % (ref 0–1)
CO2: 24 meq/L (ref 19–32)
Calcium: 9.4 mg/dL (ref 8.4–10.5)
Chloride: 102 meq/L (ref 96–112)
Cholesterol: 164 mg/dL (ref 0–200)
Creatinine, Ser: 0.78 mg/dL (ref 0.40–1.20)
Eosinophils Absolute: 0.1 10*3/uL (ref 0.0–0.7)
Eosinophils Relative: 2 % (ref 0–5)
Glucose, Bld: 88 mg/dL (ref 70–99)
HCT: 45.7 % (ref 36.0–46.0)
HDL: 64 mg/dL (ref >39)
HIV 1 RNA Quant: 9320 copies/mL — ABNORMAL HIGH (ref <20)
HIV-1 RNA Quant, Log: 3.97 — ABNORMAL HIGH (ref <1.30)
Hemoglobin: 15.1 g/dL — ABNORMAL HIGH (ref 12.0–15.0)
LDL Cholesterol: 83 mg/dL (ref 0–99)
Lymphocytes Relative: 38 % (ref 12–46)
Lymphs Abs: 2.3 10*3/uL (ref 0.7–4.0)
MCHC: 33 g/dL (ref 30.0–36.0)
MCV: 104.6 fL — ABNORMAL HIGH (ref 78.0–100.0)
Monocytes Absolute: 0.8 10*3/uL (ref 0.1–1.0)
Monocytes Relative: 13 % — ABNORMAL HIGH (ref 3–12)
Neutro Abs: 2.8 10*3/uL (ref 1.7–7.7)
Neutrophils Relative %: 47 % (ref 43–77)
Platelets: 316 10*3/uL (ref 150–400)
Potassium: 4.4 meq/L (ref 3.5–5.3)
RBC: 4.37 M/uL (ref 3.87–5.11)
RDW: 15 % (ref 11.5–15.5)
Sodium: 138 meq/L (ref 135–145)
Total Bilirubin: 0.4 mg/dL (ref 0.3–1.2)
Total CHOL/HDL Ratio: 2.6
Total Protein: 7 g/dL (ref 6.0–8.3)
Triglycerides: 85 mg/dL (ref <150)
VLDL: 17 mg/dL (ref 0–40)
WBC: 6 10*3/uL (ref 4.0–10.5)

## 2010-10-13 NOTE — Miscellaneous (Signed)
Summary: [facility]  [facility] Providers   Imported By: Randon Goldsmith 08/31/2007 12:11:07  _____________________________________________________________________  External Attachment:    Type:   Image     Comment:   External Document

## 2010-10-13 NOTE — Miscellaneous (Signed)
Summary: Orders Update  Clinical Lists Changes  Orders: Added new Test order of T-CBC w/Diff [phone]) - Signed Added new Test order of T-CD4SP [facility]) (CD4SP) - Signed Added new Test order of T-Comprehensive Metabolic Panel [phone]) - Signed Added new Test order of T-GC Probe, urine [phone]) - Signed Added new Test order of T-RPR (Syphilis) [phone]) - Signed Added new Test order of T-HIV Viral Load ([id number]) - Signed  Appended Document: lab orders     Clinical Lists Changes  Orders: Added new Test order of T-Chlamydia  Probe, urine [phone]) - Signed

## 2010-10-13 NOTE — Assessment & Plan Note (Signed)
Summary: f/u visit/dde   Chief Complaint:  follow up.  History of Present Illness: Pt saw Gyencology several months ago and was told that she needed laser ablation of her fibroid and that htey would call and set up an appt. for that procedure.  She has not received a call and she is very anxious about it.  She has lost weight worrying about it.  She states that she has been taking her meds as prescribed.  Current Allergies (reviewed today): ! PCN ! * PEACHES    Risk Factors:  Tobacco use:  current Alcohol use:  no  PAP Smear History:    Date of Last PAP Smear:  11/07/2006   Review of Systems       The patient complains of anorexia and weight loss.  The patient denies fever.     Vital Signs:  Patient Profile:   40 Years Old Female Height:     65 inches (165.10 cm) Weight:      210.3 pounds (95.59 kg) BMI:     35.12 Temp:     97.3 degrees F (36.28 degrees C) oral Pulse rate:   81 / minute BP sitting:   106 / 72  (right arm)  Pt. in pain?   no  Vitals Entered By: Geannie Risen RN (May 03, 2007 2:18 PM)              Is Patient Diabetic? No Nutritional Status BMI of > 30 = obese  Have you ever been in a relationship where you felt threatened, hurt or afraid?No   Does patient need assistance? Functional Status Self care Ambulation Normal   Physical Exam  General:     alert, well-developed, well-nourished, and well-hydrated.   Head:     normocephalic and atraumatic.   Mouth:     no thrush     Impression & Recommendations:  Problem # 1:  HIV DISEASE (ICD-042) Her last CD4ct was 160 and VL <50.  She will continue her current meds and f/u in 3 months. Her updated medication list for this problem includes:    Atripla 600-200-300 Mg Tabs (Efavirenz-emtricitab-tenofovir)    Bactrim Ds 800-160 Mg Tabs (Sulfamethoxazole-trimethoprim) .Marland Kitchen... Take 1 tablet by mouth once a day  Orders: Est. Patient Level III ([address])  Future Orders: T-CBC w/Diff  ([id number]) ... 08/07/2007 T-CD4 ([id number]) ... 08/07/2007 T-Comprehensive Metabolic Panel [phone]) ... 08/07/2007 T-HIV Viral Load [id number]) ... 08/07/2007   Problem # 2:  EXCESSIVE MENSTRUATION (ICD-626.2) We will call Dr. Serita Kyle office to try to coordinate her appt for laser ablation of her fibroid.   Patient Instructions: 1)  Please schedule a follow-up appointment in 3 months, 2 weeks after labs.    Prescriptions: BACTRIM DS 800-160 MG TABS (SULFAMETHOXAZOLE-TRIMETHOPRIM) Take 1 tablet by mouth once a day  #30 x 5   Entered and Authorized by:   Yisroel Ramming MD   Signed by:   Yisroel Ramming MD on 05/03/2007   Method used:   Print then Give to Patient   RxID:   [id number]  ]

## 2010-10-13 NOTE — Letter (Signed)
Summary: OV-03/08/2005  OV-03/08/2005   Imported By: Dorice Lamas 10/24/2006 11:50:11  _____________________________________________________________________  External Attachment:    Type:   Image     Comment:   External Document

## 2010-10-13 NOTE — Letter (Signed)
Summary: OV-10/05/2006  OV-10/05/2006   Imported By: Dorice Lamas 10/24/2006 11:48:16  _____________________________________________________________________  External Attachment:    Type:   Image     Comment:   External Document

## 2010-10-13 NOTE — Progress Notes (Signed)
Summary: Atripla refill 10-11-07/mld  Phone Note Refill Request Message from:  Fax from Pharmacy  Refills Requested: Medication #1:  ATRIPLA 600-200-300 MG TABS   Dosage confirmed as above?Dosage Confirmed   Last Refilled: 09/12/2007 Patient needs appt.  Cancelled last to appt and third was rescheduled for 08/29/07 (does not say in IDX whether patient no showed).  Initial call taken by: Paulo Fruit,  October 11, 2007 2:17 PM    New/Updated Medications: ATRIPLA 600-200-300 MG TABS (EFAVIRENZ-EMTRICITAB-TENOFOVIR) Take 1 tablet by mouth at bedtime (needs appt!!!)   Prescriptions: ATRIPLA 600-200-300 MG TABS (EFAVIRENZ-EMTRICITAB-TENOFOVIR) Take 1 tablet by mouth at bedtime (needs appt!!!)  #30 x 1   Entered by:   Paulo Fruit   Authorized by:   Yisroel Ramming MD   Signed by:   Paulo Fruit on 10/11/2007   Method used:   Electronically sent to ...       [facility] [address]       [address]       [place], [place]  [address]       Ph: [phone] or [phone]       Fax: [phone]   RxID:   [id number]  ...................................................................Paulo Fruit  October 11, 2007 2:18 PM

## 2010-10-13 NOTE — Miscellaneous (Signed)
Summary: Lab Rpt: CD4  Clinical Lists Changes  Observations: Added new observation of CD4 COUNT: 130 microliters (11/09/2006 10:00) Added new observation of CD4 %: 6 % (11/09/2006 10:00)

## 2010-10-13 NOTE — Miscellaneous (Signed)
  Clinical Lists Changes  Observations: Added new observation of YEARAIDSPOS: 2003  (08/11/2010 11:35) Added new observation of HIV STATUS: CDC-defined AIDS  (08/11/2010 11:35)

## 2010-10-13 NOTE — Progress Notes (Signed)
  Phone Note From Other Clinic   Summary of Call: Rec'd Confirmation of pt's appt with the [facility] [address] [place] [address] [phone]  for appt on 02/08/07 at 3:30pm.   Initial call taken by: Shon Hough,  Jan 19, 2007 3:13 PM

## 2010-10-13 NOTE — Consult Note (Signed)
Summary: [facility]  [facility]   Imported By: Randon Goldsmith 05/03/2007 08:25:24  _____________________________________________________________________  External Attachment:    Type:   Image     Comment:   External Document

## 2010-10-13 NOTE — Progress Notes (Signed)
Summary: referral follow-up call  Phone Note Outgoing Call   Call placed by: Jennet Maduro RN,  April 28, 2007 2:49 PM Call placed to: Dr. Mayford Knife, Dermatology - [phone] Summary of Call: Called to check on referral appt. on 02/21/07.  Pt. did not keep appt. or reschedule.

## 2010-10-13 NOTE — Assessment & Plan Note (Signed)
Summary: Lab orders/tkk

## 2010-10-13 NOTE — Progress Notes (Signed)
Summary: Percocet refill request  Phone Note Other Incoming Call back at walked into clinic   Summary of Call: Pt states she is here for Pecocet refill. She states Dr Philipp Deputy agreed to give her refills until she can see the Dentist in May.    The medical records do not indicate this. I will need to check with Dr Philipp Deputy on Tuesday.  I also suggested to the patient  that she call the dentist office to get sooner appt and if med was given on 11-22-07 it would be too soon for refill. Initial call taken by: Tomasita Morrow RN,  December 18, 2007 3:01 PM  Follow-up for Phone Call        Walk-in to clinic - Pt. in [place] w/ her father-in-law for a MD appt.  Was able to obtain an appt. w/ Dr.Johnson in [place] for May 13th for dental work.  Wanting to know about refill of pain med.  Will stop back by the clinic after finishing with other appt. Follow-up by: Jennet Maduro RN,  December 20, 2007 2:06 PM  Additional Follow-up for Phone Call Additional follow up Details #1::        ok #60 Additional Follow-up by: Yisroel Ramming MD,  December 20, 2007 2:21 PM      Prescriptions: PERCOCET 10-325 MG  TABS (OXYCODONE-ACETAMINOPHEN) Take 1 tablet by mouth every 6 hours prn  #60 x 0   Entered by:   Tomasita Morrow RN   Authorized by:   Yisroel Ramming MD   Signed by:   Tomasita Morrow RN on 12/20/2007   Method used:   Print then Give to Patient   RxID:   [id number]

## 2010-10-13 NOTE — Progress Notes (Signed)
Summary: No show for derm appt.  Phone Note From Other Clinic   Caller: Derm Summary of Call: Pt was no show for [facility] Derm Appt. Doree Albee Initial call taken by: Tomasita Morrow RN,  February 22, 2007 10:50 AM

## 2010-10-13 NOTE — Letter (Signed)
Summary: Nicole Silva-10/05/2006  Nicole Silva-10/05/2006   Imported By: Dorice Lamas 10/24/2006 11:51:34  _____________________________________________________________________  External Attachment:    Type:   Image     Comment:   External Document

## 2010-10-13 NOTE — Miscellaneous (Signed)
Summary: Orders Update/ labs/dde  Clinical Lists Changes  Orders: Added new Test order of T-CBC w/Diff [phone]) - Signed Added new Test order of T-CD4 [phone]) - Signed Added new Test order of T-Comprehensive Metabolic Panel [id number]) - Signed Added new Test order of T-HIV Viral Load [phone]) - Signed

## 2010-10-13 NOTE — Miscellaneous (Signed)
Summary: RW - HIV/AIDS Status  Clinical Lists Changes  Observations: Added new observation of YEARAIDSPOS: AIDS 2008 (09/12/2007 9:29) Added new observation of INFECTDIS MD: Philipp Deputy MD (09/12/2007 9:29)                                                               Hep C Result:  Yes Hep C Viral Load:  451000 (?)

## 2010-10-13 NOTE — Miscellaneous (Signed)
Summary: Nicole Silva  Clinical Lists Changes            Appended Document: Nicole Silva Flowsheet Update, PAP abnormal    Clinical Lists Changes  Observations: Added new observation of PAP SMEAR: abnormal (02/08/2007 10:44) Added new observation of LAST PAP DAT: 02/08/2007 (02/08/2007 10:44)

## 2010-10-13 NOTE — Progress Notes (Signed)
Summary: Atripla phoned in 04-18-07/mld  Phone Note Call from Patient   Reason for Call: Refill Medication Summary of Call: Patient called requesting a refill for her Atripla.  Patient left the phone number in which is [phone] ([facility], [name]).  Patient has a lab appt scheduled for 04/24/07. Initial call taken by: Paulo Fruit,  April 18, 2007 10:19 AM      Prescriptions: ATRIPLA 600-200-300 MG TABS (EFAVIRENZ-EMTRICITAB-TENOFOVIR)   #30 x 5   Entered by:   Paulo Fruit   Authorized by:   Yisroel Ramming MD   Signed by:   Paulo Fruit on 04/18/2007   Method used:   Telephoned to ...         RxID:   [id number]  Spoke to Adonis Housekeeper ...................................................................Paulo Fruit  April 18, 2007 10:22 AM

## 2010-10-13 NOTE — Miscellaneous (Signed)
Summary: Orders Update  Clinical Lists Changes  Orders: Added new Test order of T-Comprehensive Metabolic Panel [phone]) - Signed Added new Test order of T-CBC w/Diff [phone]) - Signed Added new Test order of T-CD4 [phone]) - Signed Added new Test order of T-HIV Viral Load [phone]) - Signed Added new Test order of T-Syphilis Test (RPR) ([id number]) - Signed Added new Test order of T-Urinalysis ([id number]) - Signed

## 2010-10-13 NOTE — Miscellaneous (Signed)
Summary: Lab Rpt: CD4  Clinical Lists Changes  Observations: Added new observation of CD4 COUNT: 130 microliters (11/09/2006 12:42) Added new observation of CD4 %: 6 % (11/09/2006 12:42)

## 2010-10-13 NOTE — Progress Notes (Signed)
Summary: Copy of labs and last OV  Phone Note From Other Clinic   Caller: [facility]    [phone] Summary of Call: Requesting copied of last OV , labs  and diagnosis.  Release is signed by patient.  Records sent via fax to [phone]   Release scanned  Initial call taken by: Tomasita Morrow RN,  March 11, 2008 4:30 PM

## 2010-10-13 NOTE — Miscellaneous (Signed)
Summary: [facility]  [facility]   Imported By: Florinda Marker 07/11/2008 15:13:28  _____________________________________________________________________  External Attachment:    Type:   Image     Comment:   External Document

## 2010-10-13 NOTE — Miscellaneous (Signed)
Summary: [facility]   Imported By: Florinda Marker 03/12/2008 15:15:10  _____________________________________________________________________  External Attachment:    Type:   Image     Comment:   External Document

## 2010-10-13 NOTE — Assessment & Plan Note (Signed)
Summary: FU OV/VS   Chief Complaint:  f/u  seen in er for broken teeth 11/21/2007.  History of Present Illness: Pt cracked 2 of her teeth yesterday eating chicken.  She went to the ED and was given 4 percocet which helped.  She has a dental appt for May and is on a waiting list in case somenone cancels.  She is in a lot of pain and would like some additional pain medication.    Current Allergies: ! PCN ! * PEACHES    Risk Factors:  Tobacco use:  current Alcohol use:  no  PAP Smear History:    Date of Last PAP Smear:  02/08/2007   Review of Systems       The patient complains of weight gain.  The patient denies anorexia and fever.     Vital Signs:  Patient Profile:   40 Years Old Female Height:     65 inches (165.10 cm) Weight:      225 pounds (102.27 kg) BMI:     37.58 Temp:     97.3 degrees F (36.28 degrees C) oral Pulse rate:   92 / minute BP sitting:   123 / 68  (left arm)  Pt. in pain?   yes    Location:   teeth    Intensity:   8    Type:       aching  Vitals Entered By: Starleen Arms (November 22, 2007 3:40 PM)              Is Patient Diabetic? No Nutritional Status BMI of > 30 = obese  Does patient need assistance? Functional Status Self care Ambulation Normal     Physical Exam  General:     alert, well-developed, well-nourished, and well-hydrated.   Head:     normocephalic and atraumatic.   Mouth:     2 cracked teeth no thrush  Lungs:     normal breath sounds.   Heart:     normal rate, regular rhythm, and no murmur.      Impression & Recommendations:  Problem # 1:  DENTAL CARIES (ICD-521.00) percocet for pain Dental referral  Problem # 2:  HIV DISEASE (ICD-042) Pt.s most recent CD4ct was 170 and VL <50 .  Pt instructed to continue the current antiretroviral regimen.  Pt encouraged to take medication regularly and not miss doses.  Pt will f/u in 3 months for repeat blood work and will see me 2 weeks later.  Her updated  medication list for this problem includes:    Atripla 600-200-300 Mg Tabs (Efavirenz-emtricitab-tenofovir) .Marland Kitchen... Take 1 tablet by mouth at bedtime (needs appt!!!)    Bactrim Ds 800-160 Mg Tabs (Sulfamethoxazole-trimethoprim) .Marland Kitchen... Take 1 tablet by mouth once a day  Orders: Est. Patient Level III ([address])  Future Orders: T-CBC w/Diff ([id number]) ... 02/26/2008 T-CD4 ([id number]) ... 02/26/2008 T-Comprehensive Metabolic Panel [id number]) ... 02/26/2008 T-HIV Viral Load [phone]) ... 02/26/2008   Medications Added to Medication List This Visit: 1)  Percocet 10-325 Mg Tabs (Oxycodone-acetaminophen) .... Take 1 tablet by mouth every 6 hours prn   Patient Instructions: 1)  Please schedule a follow-up appointment in 3 months, 2 weeks after labs.    Prescriptions: PERCOCET 10-325 MG  TABS (OXYCODONE-ACETAMINOPHEN) Take 1 tablet by mouth every 6 hours prn  #60 x 0   Entered and Authorized by:   Yisroel Ramming MD   Signed by:   Yisroel Ramming MD on 11/22/2007   Method used:   Print then  Give to Patient   RxID:   [id number] BACTRIM DS 800-160 MG TABS (SULFAMETHOXAZOLE-TRIMETHOPRIM) Take 1 tablet by mouth once a day  #30 x 5   Entered and Authorized by:   Yisroel Ramming MD   Signed by:   Yisroel Ramming MD on 11/22/2007   Method used:   Print then Give to Patient   RxID:   [id number] ATRIPLA 600-200-300 MG TABS (EFAVIRENZ-EMTRICITAB-TENOFOVIR) Take 1 tablet by mouth at bedtime (needs appt!!!)  #30 x 5   Entered and Authorized by:   Yisroel Ramming MD   Signed by:   Yisroel Ramming MD on 11/22/2007   Method used:   Print then Give to Patient   RxID:   [id number]  ]

## 2010-10-13 NOTE — Progress Notes (Signed)
  Phone Note Outgoing Call   Call placed by: Geannie Risen RN,  May 03, 2007 3:21 PM Summary of Call: Called Dr. Serita Kyle office and spoke with her secretary RE: schedule for pt's surgery. They will get in touch with the pt. (cell [phone]) asap.

## 2010-10-13 NOTE — Miscellaneous (Signed)
Summary: RW  Clinical Lists Changes  Observations: Added new observation of SYPHLSCREEN: 11/09/2006 (11/09/2006 16:14)

## 2010-10-14 LAB — T-HELPER CELL (CD4) - (RCID CLINIC ONLY)
CD4 % Helper T Cell: 8 % — ABNORMAL LOW (ref 33–55)
CD4 T Cell Abs: 180 uL — ABNORMAL LOW (ref 400–2700)

## 2010-10-15 NOTE — Miscellaneous (Signed)
  Clinical Lists Changes  Observations: Added new observation of INCOMESOURCE: UNKNOWN (10/08/2010 11:56) Added new observation of YEARLYEXPEN: 0  (10/08/2010 11:56)

## 2010-10-15 NOTE — Assessment & Plan Note (Signed)
Summary: f/u/kam   CC:  follow-up visit, lab results, recently released from prison, last seen 3/09, and c/o cough and congestion x 5 days.  History of Present Illness: patient was off her a triplet for about 60 days due to being incarcerated.  She started taking her medication approximately 3 days after having her labs drawn.  She has been taking it consistently since then.  She complains of a cough productive of yellow sputum.  No fever or chills.  Preventive Screening-Counseling & Management  Alcohol-Tobacco     Alcohol drinks/day: 0     Smoking Status: current     Packs/Day: 0.5  Caffeine-Diet-Exercise     Caffeine use/day: soda 2 per day     Does Patient Exercise: no  Safety-Violence-Falls     Seat Belt Use: yes      Sexual History:  no.        Drug Use:  former and cocaine.    Comments: pt. given condoms   Updated Prior Medication List: ATRIPLA 600-200-300 MG TABS (EFAVIRENZ-EMTRICITAB-TENOFOVIR) Take 1 tablet by mouth at bedtime (needs appt!!!) BACTRIM DS 800-160 MG TABS (SULFAMETHOXAZOLE-TRIMETHOPRIM) Take 1 tablet by mouth once a day ZITHROMAX Z-PAK 250 MG TABS (AZITHROMYCIN) take as directed  Current Allergies (reviewed today): ! PCN ! * PEACHES Past History:  Past Medical History: Last updated: 10/18/2006 HIV disease boil -right groin Depression Hepatitis C  Social History: Sexual History:  no Drug Use:  former, cocaine  Review of Systems  The patient denies anorexia, fever, weight loss, and hemoptysis.    Vital Signs:  Patient profile:   40 year old female Height:      65 inches (165.10 cm) Weight:      198.8 pounds (90.36 kg) BMI:     33.20 Temp:     98.0 degrees F (36.67 degrees C) oral Pulse rate:   97 / minute BP sitting:   117 / 77  (right arm)  Vitals Entered By: Wendall Mola CMA Duncan Dull) (August 24, 2010 10:02 AM) CC: follow-up visit, lab results, recently released from prison, last seen 3/09, c/o cough and congestion x 5  days Is Patient Diabetic? No Pain Assessment Patient in pain? yes     Location: back Intensity: 8 Type: aching Onset of pain  Constant Nutritional Status BMI of > 30 = obese Nutritional Status Detail appetite "so-so"  Have you ever been in a relationship where you felt threatened, hurt or afraid?No   Does patient need assistance? Functional Status Self care Ambulation Normal Comments no missed doses of meds per pt.   Physical Exam  General:  alert, well-developed, well-nourished, and well-hydrated.   Head:  normocephalic and atraumatic.   Mouth:  pharynx pink and moist.   Lungs:  crackles bilaterally   Impression & Recommendations:  Problem # 1:  HIV DISEASE (ICD-042) Will repeat labs after she has been on her atripla for 6 weeks. Diagnostics Reviewed:  HIV: CDC-defined AIDS (08/11/2010)   CD4: 120 (08/04/2010)   CD4 %: 6 (11/09/2006) WBC: 8.3 (08/03/2010)   Hgb: 13.2 (08/03/2010)   HCT: 39.9 (08/03/2010)   Platelets: 231 (08/03/2010) HIV-1 RNA: 79000 (08/03/2010)   HBSAg: NO (11/07/2006)  Problem # 2:  ACUTE BRONCHITIS (ICD-466.0)  will treat with a z-pack  Her updated medication list for this problem includes:    Bactrim Ds 800-160 Mg Tabs (Sulfamethoxazole-trimethoprim) .Marland Kitchen... Take 1 tablet by mouth once a day    Zithromax Z-pak 250 Mg Tabs (Azithromycin) .Marland Kitchen... Take as directed  Medications  Added to Medication List This Visit: 1)  Zithromax Z-pak 250 Mg Tabs (Azithromycin) .... Take as directed  Other Orders: Est. Patient Level III ([address]) Influenza Vaccine NON MCR ([address]) Hepatitis B Vaccine >42yrs ([address]11) Admin 1st Vaccine (91478) Future Orders: T-CD4SP ([facility]) (CD4SP) ... 10/05/2010 T-HIV Viral Load [phone]) ... 10/05/2010 T-Comprehensive Metabolic Panel [phone]) ... 10/05/2010 T-CBC w/Diff ([id number]) ... 10/05/2010 T-RPR (Syphilis) [phone]) ... 10/05/2010 T-Lipid Profile [phone]) ... 10/05/2010  Patient Instructions: 1)   Please schedule a follow-up appointment in 8 weeks, 2 weeks after labs  Prescriptions: ZITHROMAX Z-PAK 250 MG TABS (AZITHROMYCIN) take as directed  #1 pack x 0   Entered and Authorized by:   Yisroel Ramming MD   Signed by:   Yisroel Ramming MD on 08/24/2010   Method used:   Print then Give to Patient   RxID:   [id number]     Immunizations Administered:  Influenza Vaccine # 1:    Vaccine Type: Fluvax Non-MCR    Site: left deltoid    Mfr: Novartis    Dose: 0.5 ml    Route: IM    Given by: Wendall Mola CMA ( AAMA)    Exp. Date: 12/13/2010    Lot #: [id number]    VIS given: 04/07/10 version given August 24, 2010.  Hepatitis B Vaccine # 1:    Vaccine Type: HepB Adult    Site: right deltoid    Mfr: Merck    Dose: 0.5 ml    Route: IM    Given by: Wendall Mola CMA ( AAMA)    Exp. Date: 08/31/2012    Lot #: [id number]    VIS given: 03/30/06 version given August 24, 2010.  Flu Vaccine Consent Questions:    Do you have a history of severe allergic reactions to this vaccine? no    Any prior history of allergic reactions to egg and/or gelatin? no    Do you have a sensitivity to the preservative Thimersol? no    Do you have a past history of Guillan-Barre Syndrome? no    Do you currently have an acute febrile illness? no    Have you ever had a severe reaction to latex? no    Vaccine information given and explained to patient? yes    Are you currently pregnant? no

## 2010-10-15 NOTE — Miscellaneous (Signed)
Summary: [facility]   [facility]   Imported By: Florinda Marker 09/09/2010 16:34:03  _____________________________________________________________________  External Attachment:    Type:   Image     Comment:   External Document

## 2010-10-15 NOTE — Miscellaneous (Signed)
[facility]  [employer]  [employer]  [employer]   Imported By: Florinda Marker 09/09/2010 16:02:53  _____________________________________________________________________  External Attachment:    Type:   Image     Comment:   External Document

## 2010-10-21 NOTE — Miscellaneous (Signed)
Summary: RW Financial Update   Clinical Lists Changes  Observations: Added new observation of RWTITLE: D (10/13/2010 9:59) Added new observation of AIDSDAP: Pending-approval for 2012 (10/13/2010 9:59) Added new observation of PAYOR: No Insurance (10/13/2010 9:59) Added new observation of PCTFPL: 0  (10/13/2010 9:59) Added new observation of INCOMESOURCE: none  (10/13/2010 9:59) Added new observation of HOUSEINCOME: 0  (10/13/2010 9:59) Added new observation of FINASSESSDT: 10/13/2010  (10/13/2010 9:59)

## 2010-10-26 ENCOUNTER — Ambulatory Visit: Payer: Self-pay | Admitting: Internal Medicine

## 2010-10-27 ENCOUNTER — Ambulatory Visit: Payer: Self-pay | Admitting: Internal Medicine

## 2010-10-29 ENCOUNTER — Encounter (INDEPENDENT_AMBULATORY_CARE_PROVIDER_SITE_OTHER): Payer: Self-pay | Admitting: *Deleted

## 2010-11-04 NOTE — Miscellaneous (Signed)
Summary: ADAP approved til 06/13/11  Clinical Lists Changes  Observations: Added new observation of AIDSDAP: Yes 2012 (10/29/2010 16:46)

## 2010-11-25 LAB — T-HELPER CELL (CD4) - (RCID CLINIC ONLY)
CD4 % Helper T Cell: 5 % — ABNORMAL LOW (ref 33–55)
CD4 T Cell Abs: 120 uL — ABNORMAL LOW (ref 400–2700)

## 2011-01-26 NOTE — Group Therapy Note (Signed)
NAME:  Nicole Silva, SIDNEY            ACCOUNT NO.:  [id number]   MEDICAL RECORD NO.:  [id number]           PATIENT TYPE:   LOCATION:  [facility]                     FACILITY:   PHYSICIAN:  Argentina Donovan, MD        DATE OF BIRTH:  1971-05-08   DATE OF SERVICE:  05/03/2007                                  CLINIC NOTE   CLINIC COMMUNICATION:  The patient is a 40 year old Caucasian female  gravida 6, para 0-0-6-0 with six spontaneous miscarriages and HIV with a  long history of drug use.  See previous note.  She is being scheduled  for hydrothermal ablation, hysteroscopy, fulguration of venereal warts,  insertion of IUD.  We spoke to the patient again today.  She has decided  that this is what she wants done.  We will go ahead and get that  scheduled for her at the earliest possible date and [place] will contact  her.   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           ______________________________  Argentina Donovan, MD     PR/MEDQ  D:  05/03/2007  T:  05/04/2007  Job:  [id number]

## 2011-01-26 NOTE — Group Therapy Note (Signed)
NAME:  Nicole Silva, Nicole Silva NO.:  [id number]   MEDICAL RECORD NO.:  [id number]          PATIENT TYPE:  WOC   LOCATION:  [facility]                   FACILITY:  [facility]   PHYSICIAN:  Argentina Donovan, MD        DATE OF BIRTH:  1971-03-26   DATE OF SERVICE:  02/08/2007                                  CLINIC NOTE   The patient is a 40 year old Caucasian female gravida 6, para 0-0-6-0  with a history of six spontaneous miscarriages. She has HIV and has in  the past been incarcerated for drug use, although she is clean now. She  smokes, but has cut down to only three cigarettes a day. A very pleasant  lady with a main complaint of increasing heavy periods over the past  year where she has bled for 3 days now at seven. On the heaviest day she  uses about 12 tampons. She also desires contraception. In addition to  this is complaining of some vague adnexal pain and a wart on her peri-  anal area.   PHYSICAL EXAMINATION:  ABDOMEN: Is soft, flat, nontender.  No masses or  organomegaly.  The external genitalia is normal.  There is a small 1/2-cm pedunculated  condylomata acuminata just in the peri-anal area. This was treated with  try with 80% trichloroacetic acid. The external genitalia is normal.  BUS is within normal limits.  Vagina is clean and well rugated.  Cervix  is clean and nulliparous. A Pap smear was taken.  Uterus appears to be  of normal size, shape, consistency, although I could not outline it  extremely well because the patient's habitus which is she was 210 pounds  5 feet 5 inches tall.  Adnexa was not tender at all.   I am going to order an ultrasound to evaluate her endometrial stripe and  also to evaluate the adnexa.  I have talked to her about the possibility  of hydrothermal ablation. Since and she is a smoker I would rather not  use oral contraceptives even though she is cutting down until she  completely stops. I thought if we did that we could also put in a  Mirena  at the same time, which would serve the purpose of her contraception  that she desires.   IMPRESSION:  A patient with  HIV and increasing menorrhagia with vague  adnexal discomfort and single condyloma acuminata, treated. When the  ultrasound comes back and is evaluated, I will call the patient. If it  is okay will go ahead and schedule her for the endometrial ablation and  the insertion of the IUD. The patient left her cell number which is  [phone]; her mother's phone number was [phone] and mother's cell if  she is not available is [phone].           ______________________________  Argentina Donovan, MD     PR/MEDQ  D:  02/08/2007  T:  02/08/2007  Job:  [id number]

## 2011-01-26 NOTE — Op Note (Signed)
NAME:  Nicole Silva, Nicole Silva            ACCOUNT NO.:  [id number]   MEDICAL RECORD NO.:  [id number]          PATIENT TYPE:  AMB   LOCATION:                                [facility]   PHYSICIAN:  Phil D. Okey Dupre, M.D.     DATE OF BIRTH:  October 18, 1970   DATE OF PROCEDURE:  06/08/2007  DATE OF DISCHARGE:                               OPERATIVE REPORT   PROCEDURE:  Hysteroscopy and hydrothermal endometrial ablation,  insertion of Mirena IUD and fulguration of venereal warts.   SURGEON:  Javier Glazier. Okey Dupre, M.D.   ANESTHESIA:  General.   SPECIMENS:  None.   ESTIMATED BLOOD LOSS:  Minimal.   POSTOPERATIVE CONDITION:  Satisfactory.   PREOPERATIVE DIAGNOSES:  Intractable menorrhagia with venereal warts and  contraception initiation.   POSTOPERATIVE DIAGNOSIS:  Intractable menorrhagia with venereal warts  and contraception initiation.   PROCEDURE:  Under satisfactory general anesthesia, the patient in dorsal  lithotomy position, the perineum, vagina prepped and draped in the usual  sterile manner.  Bimanual pelvic examination under anesthesia revealed  the uterus of normal size, shape, consistency, anterior flexed freely  movable with normal free adnexa.  There were multiple condylomata on the  perineum and the perianal area especially on the left side. External  genitalia was normal.  BUS within normal limits.  Vagina is clean and  well ruggated.  The cervix was clean, parous and the adnexa was normal,  uterus normal size, shape, consistency.  Weighted speculum was placed in  posterior fourchette of the vagina.  Anterior lip of cervix grasped with  single-tooth tenaculum.  Uterine cavity sounded to depth of 8 cm.  Cervical os dilated to #7 Hegar dilator.  The hysteroscope was inserted  into the uterine cavity.  The entire cavity was easily observed and  found to be within normal limits.  Both ostia of the fallopian tubes  were noted and endometrial thermal hydro ablation was carried out the  usual method making sure there was no fluid leak, regulating and  checking on fluid during the entire procedure.  Once adequate  temperature of between 88 and 92 degrees centigrade was reached, time  for ablation then was 10 minutes and cooling down occurred.  The scope  was removed.  A Mirena IUD was inserted into the uterine cavity and the  string cut for length of about 3 cm.  Hot cautery was used to fulgurate  the venereal warts on the patient's perineum and perianal area.  The  patient was transferred recovery room in satisfactory condition having  tolerated the procedure well.     Phil D. Okey Dupre, M.D.  Electronically Signed    PDR/MEDQ  D:  06/08/2007  T:  06/08/2007  Job:  [id number]
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
## 2011-01-29 NOTE — Op Note (Signed)
[place]. [facility]  Patient:    Nicole Silva, Nicole Silva                  MRN: [id number] Proc. Date: 11/03/99 Adm. Date:  [id number] Attending:  Aundria Mems                           Operative Report  PREOPERATIVE DIAGNOSIS:  Suppurative necrosing submental and right submaxillary  lymphadenopathy, history of human immunodeficiency virus positive, and treated tuberculosis.  POSTOPERATIVE DIAGNOSIS:  Suppurative necrosing submental and right submaxillary lymphadenopathy, history of human immunodeficiency virus positive, and treated tuberculosis, pending histological testing.  OPERATIVE PROCEDURE: 1. Excision of right submaxillary triangle suppurative lymph node. 2. Excision of submental suppurative lymph node.  SURGEON:  Kathy Breach, M.D.  ANESTHESIA:  General orotracheal.  DESCRIPTION OF PROCEDURE:  With the patient under general orotracheal anesthesia, the neck was prepped and draped in a sterile fashion.  The patient presented with a midline submental node just above the level of the hyoid bone, a good 2.5 cm in  size with a _______ pinhole spontaneous drain site.  That is the node about half the size when it was seen at the time of surgery scheduled.  The patient has a  2.5 cm to 3 cm firm, freely mobile, but stuck to the immediate adjacent tissue ass in the right submaxillary triangle just on the under side of the mandible.  The  skin incision and skin lines horizontal to the free margin of the mandible one fingerbreadth anteriorly to a little bit further off the posterior margin of the mandible inferiorly, made over the submaxillary mass.  An elliptical incision site was marked over the midline horizontal incision, parallel to the plane of the hyoid.  Using the sharp scalpel, an incision was made through the skin and the subcutaneous tissue, along the right submaxillary triangle area.  Constant visualization of the corner of the mouth  for any stimulation of the mandibular branch of the facial nerve throughout the entire procedure.  Minimal pricking more from excision of the platysmal muscle procedure was noted, but no gross mandibular branch stimulation apparent.  Dissecting mainly with gentle cutting current on he fibrotic capsule of the gland resulting in eventually completely removing it. t was disrupted partially through and contents filled with a ___ yellow creamy purulent discharge, samples of which were submitted for aerobic and anaerobic cultures, as well as acid-fast.  The posterior facial vein was encountered, encased in the posterior capsule suppurative lymph node which actually lay on the anterosuperior surface of the submaxillary gland and posterior surface of the mandible.  Once completely excised and the facial vein having been divided and incised with 4-0 silk ties above and below its ________ with the capsule of the  gland.  The wound was irrigated and closed with interrupted 4-0 chromic catgut sutures in the platysma and the skin closed with interrupted 4-0 Surgical sutures.  A horizontal elliptical incision, excising the overlying the skin of the drain nd each port of the submental node was made, and using electrical dissection primarily on the capsule, the fibrotic node was completely removed, being in a very subcutaneous position in the suprahyoid midline of the neck.  The wound was irrigated completely and the skin margin closed with interrupted 4-0 Surgilon sutures in the skin, as there was no significant subcutaneous tissue to place a  subcuticular layer.  The skin sutures were placed in  a tacking fashion to the depths of the wound, so as to include dead space.  Blood loss from the procedure was probably less than 20-30 cc.  The patient tolerated the procedure well and was taken to the recovery room in stable general condition. DD:  11/03/99 TD:  11/04/99 Job: [id number]

## 2011-01-29 NOTE — Op Note (Signed)
[place]. [facility]  Patient:    Nicole Silva                   MRN: [id number] Proc. Date: 08/12/99 Adm. 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  OPERATIVE PROCEDURE:  Excisional biopsy right superficial and deep jugulodigastric cervical node.  POSTOPERATIVE DIAGNOSIS:  Clinically, probably a caseating lymph node pending histological confirmation.  DESCRIPTION OF PROCEDURE:  With the patient under general _________ anesthesia, the right neck was prepped and draped in a sterile fashion.  Horizontal skin line incision over the now very superficial slightly reddened overlying skin mass with a deep palpable mass in the right mid to upper jugular chain just anterior to the  margin of the sternal mastoid was made.  The incision was about 1-1/2 inches long. Upon immediately going through deep dermis, a yellow creamy purulence exuded from the superficial aspect of the lymph node.  This was a culture taken aerobic/anaerobic cultures in acid-fast staining.  There was a superficial, very fibrotic layered apparent mostly destructed lymph node after the central portion of purulent or caseous to almost caseous material was removed.  This was excised circumferentially and the deep margin was the larger 3-4 cm jugulodigastric area node.  A deeper node was then again very meticulously because of no clean dissecting plane but dissected staying that it is closely on its capsule as possible and watching careful for important structures in the area.  The mass eventually ruptured and again was filled with light yellow purulent thick material and more gelatinous, almost suspect that this being a caseating node. Dissecting on the wall of the capsule the very fibrous tissue eventually able to completely remove it, identifying the  anterior margin of the sternal mastoid muscle on the  posterior aspect of the inferior margin of the posterior belly of the digastric muscle superiorly.  This was raised up off of the wall of the internal jugular ein and its inferior margin and the angle of the inferior jugular vein was the posterior facial vein.  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  Hemostasis kept complete throughout the procedure with light touch electrocautery and 4-0 silk ties on a couple of vessels near the apparent anomalous accessory nerve.  The wound was irrigated diffusely and closed, only no deep layer tissues really available to accept suturing.  After a placing a 1/4 inch Penrose drain, the skin was approximated with interrupted 4-0 chromic catgut subcutaneous sutures.  The skin was approximated with interrupted 5-0 Surgilon sutures. Sterile dressing was applied.  Blood loss of the procedures less than 10-20 cc. The patient tolerated the procedure satisfactorily and was taken to the recovery room in stable general condition. DD:  08/12/99 TD:  08/13/99 Job: [id number]

## 2011-01-29 NOTE — Discharge Summary (Signed)
NAME:  Nicole Silva, Nicole Silva                      ACCOUNT NO.:  [id number]   MEDICAL RECORD NO.:  [id number]                   PATIENT TYPE:  IPS   LOCATION:  0303                                 FACILITY:  [facility]   PHYSICIAN:  Geoffery Lyons, M.D.                   DATE OF BIRTH:  05/30/1971   DATE OF ADMISSION:  02/27/2004  DATE OF DISCHARGE:  02/29/2004                                 DISCHARGE SUMMARY   CHIEF COMPLAINT AND PRESENT ILLNESS:  This was the first admission to [facility] for this 40 year old married white female  voluntarily admitted.  She endorsed suicidal ideation, increased depression  for two weeks, wanting to overdose.  She had been drinking, using crack,  abusing Xanax.  She had not been sleeping.  She had lost weight.  She  endorsed the hallucinations were auditory.  She reported conflict with the  husband.   PAST PSYCHIATRIC HISTORY:  This was the first time at [facility].  She had been diagnosed with paranoid schizophrenia.  She was  hospitalized at [facility] twice, detoxified twice.  She had a  history of suicidal attempts.   SUBSTANCE ABUSE HISTORY:  She smoked, drinking liquor, using crack as well  as abusing Xanax.   PAST MEDICAL HISTORY:  HIV diagnosed in 68.   MEDICATIONS:  1. _________ antiviral.  2. Paxil 12.5 mg.  3. Xanax as needed.   PHYSICAL EXAMINATION:  Physical examination was performed, failed to show  any acute findings.   LABORATORY DATA:  CBC: Hemoglobin 15.0, mean corpuscular volume 105.9.  Liver profile: SGOT 55, SGPT 56.   MENTAL STATUS EXAM:  Mental status exam revealed an alert female in bed, no  eye contact, difficult to engage.  Speech was clear, pretty sedated, no  spontaneous content, very poor historian.  Did not seem to be responding to  internal stimuli.  Cognitive: Cognition was preserved.   ADMISSION DIAGNOSES:   AXIS I:  1. Depressive disorder, not otherwise  specified.  2. Polysubstance abuse.   AXIS II:  No diagnosis.   AXIS III:  Human immunodeficiency virus.   AXIS IV:  Moderate.   AXIS V:  Global assessment of functioning upon admission 25-30, highest  global assessment of functioning in the last year 60.   HOSPITAL COURSE:  She was admitted and started in intensive individual and  group psychotherapy.  She was given Ambien for sleep.  She was given Librium  p.r.n. for withdrawal.  She was placed on Paxil 12.5 mg daily.  She was  placed on Septra, also Sustiva 600 mg at night and Truvada 200/300 mg one at  night, Seroquel 50 mg every six hours as needed, Motrin 400 mg every six  hours as needed, Orajel for tooth pain.  Xanax was discontinued.  She  endorsed no suicidal ideations, said that she got to relapse, the newest  multiple stressors and that she was not hearing voices, feeling much better,  feeling that she needed to go home.  She stated that the husband was leaving  town and he was going to take her with him.  He was living two hours away  and was wanting once they had the family session to leave after the family  session.  There was a family session where she denied suicidal or homicidal  ideation.  Husband had no concern about her discharge.  As she was in full  contact with reality, there were no suicidal ideas, no homicidal ideas, and  it was felt that she, indeed, had to leave as husband was leaving, we went  ahead and discharged to outpatient treatment.   DISCHARGE DIAGNOSES:   AXIS I:  1. Polysubstance abuse.  2. Depressive disorder, not otherwise specified.   AXIS II:  No diagnosis.   AXIS III:  Human immunodeficiency virus.   AXIS IV:  Moderate.   AXIS V:  Global assessment of functioning upon discharge 50.   DISCHARGE MEDICATIONS:  1. Septra one at night.  2. Paxil CR 12.5 mg daily.  3. Sustiva 200 mg three at night.  4. Truvada one tablet at bedtime 200/300.  5. ReVia 50 mg one half daily.  6.  Albuterol inhaler two puffs every four hours as needed.   FOLLOW UP:  She was to follow up at [facility].                                               Geoffery Lyons, M.D.    IL/MEDQ  D:  03/24/2004  T:  03/25/2004  Job:  [id number]

## 2011-01-29 NOTE — Discharge Summary (Signed)
NAMEAKEIBA, AXELSON            ACCOUNT NO.:  [id number]   MEDICAL RECORD NO.:  [id number]          PATIENT TYPE:  IPS   LOCATION:  0506                          FACILITY:  [facility]   PHYSICIAN:  Geoffery Lyons, M.D.      DATE OF BIRTH:  25-Aug-1971   DATE OF ADMISSION:  11/11/2006  DATE OF DISCHARGE:  11/15/2006                               DISCHARGE SUMMARY   CHIEF COMPLAINT AND PRESENT ILLNESS:  This was the first admission to  [facility] for this 40 year old married white female.  Presented as a walk-in complaining about marital stress, felt like she  was a slave to her mother.  Mother needed her assistance due to physical  complications.  After six months of sobriety, she recently relapsed on 3  grams of crack, 3 bottles of wine, 1 blunt of marijuana, 4 mg of Xanax.  Initially, she denied suicidal thoughts or ideation.  Reported that she  has attempted in the past, mostly by cutting.  She also acknowledged  visual hallucinations of [place].  She reported that this is who she  goes to when she needs support.  Then endorsed that, if she did not get  any help, she was afraid that she might kill herself.   PAST PSYCHIATRIC HISTORY:  [facility] in 1998.  Behavioral  Health in 2005.  Since then, Dr. Duayne Cal in [facility].   ALCOHOL/DRUG HISTORY:  Relapsed on drugs and alcohol this past weekend  as already stated.  No clear information in terms of long-term sobriety.   MEDICAL HISTORY:  HIV positive.  Urinary tract infection.   MEDICATIONS:  Seroquel 50 mg in the morning, 100 mg at bedtime, Xanax 1  mg twice a day.   PHYSICAL EXAMINATION:  Performed and failed to show any acute findings.   LABORATORY DATA:  TSH 1.261.  UDS positive for opiates, benzodiazepines  and marijuana.   MENTAL STATUS EXAM:  Alert, cooperative female.  Somewhat unkempt,  casually-dressed.  Speech was not pressured, normal rate, tempo and  production.   Mood depressed, anxious.  Affect was congruent.  Thought  processes were clear, rational and goal-oriented.  Wanted to get a  therapist and something to do in her life other than being a slave for  her mother.  Judgment and insight were fair.  Cognition was well-  preserved.  Does say that she talks to [place] for support.  No  real auditory or visual hallucinations.   ADMISSION DIAGNOSES:  AXIS I:  Major depression.  Rule out bipolar,  depressed.  Polysubstance dependence.  AXIS II:  No diagnosis.  AXIS III:  HIV positive, urinary tract infection.  AXIS IV:  Moderate.  AXIS V:  GAF upon admission 35; highest GAF in the last year 60.   HOSPITAL COURSE:  She was admitted and started in individual and group  psychotherapy.  We basically detoxed with Librium.  Gave him some Ambien  for sleep.  Had some Seroquel available to her.  She endorsed that she  has been sober for six months.  Relapsed on cocaine and  alcohol two days  prior to this admission in response to increased stressors, marital  problems.  Was endorsing some toothache.  She was treated  symptomatically.  Also, she was given Bactrim and Pyridium for the UTI  and she was detoxified with Librium.  Did endorsed that she had the  pressure of taking care of two households, multiple people, three  grownups, three children.  Family history of bipolar disorder and  alcohol dependence.  Endorsed mood swings, flip out.  By November 15, 2006,  she was in full contact with reality.  Endorsed no suicidal ideation.  No homicidal ideation.  No hallucinations.  No delusions.  Endorsed that  she was feeling so much better.  Family session on November 15, 2006.  The  husband stated that she tended to take too much responsibility in her  life, always trying to help others, does not focus on herself.  They  both discussed the need for her to have a better balance.  Husband  stated that he has a lot of support, family, friends, that she has a lot   of trauma in her life and tends to be passive.  She was willing to  follow up on an outpatient basis.  Endorsed no suicidal or homicidal  ideation.  Went ahead and discharged to outpatient follow-up.   DISCHARGE DIAGNOSES:  AXIS I:  Mood disorder not otherwise specified.  Polysubstance dependence.  AXIS II:  No diagnosis.  AXIS III:  HIV positive, urinary tract infection.  AXIS IV:  Moderate.  AXIS V:  GAF upon discharge 50-55.   DISCHARGE MEDICATIONS:  1. __________ 1 tab at night.  2. Septra DS 1 twice a day.  3. Seroquel 50 mg twice a day and 300 mg at bedtime.  4. Ultram 100 mg, 1 three times a day as needed for pain.   FOLLOWUP:  [facility]      Geoffery Lyons, M.D.  Electronically Signed     IL/MEDQ  D:  12/06/2006  T:  12/06/2006  Job:  [id number]

## 2011-04-20 ENCOUNTER — Encounter: Payer: Self-pay | Admitting: Internal Medicine

## 2011-04-21 NOTE — Progress Notes (Signed)
BC enrolled patient into the bridge counseling program today. BC will assist pt with re engaging in medical care and accessing her medications.

## 2011-04-26 ENCOUNTER — Other Ambulatory Visit: Payer: Self-pay | Admitting: Infectious Diseases

## 2011-04-26 ENCOUNTER — Other Ambulatory Visit: Payer: Self-pay

## 2011-04-26 ENCOUNTER — Other Ambulatory Visit (INDEPENDENT_AMBULATORY_CARE_PROVIDER_SITE_OTHER): Payer: Self-pay

## 2011-04-26 DIAGNOSIS — B20 Human immunodeficiency virus [HIV] disease: Secondary | ICD-10-CM

## 2011-04-26 DIAGNOSIS — Z79899 Other long term (current) drug therapy: Secondary | ICD-10-CM

## 2011-04-26 DIAGNOSIS — Z113 Encounter for screening for infections with a predominantly sexual mode of transmission: Secondary | ICD-10-CM

## 2011-04-27 LAB — CBC WITH DIFFERENTIAL/PLATELET
Basophils Absolute: 0 10*3/uL (ref 0.0–0.1)
Basophils Relative: 0 % (ref 0–1)
Eosinophils Absolute: 0 10*3/uL (ref 0.0–0.7)
Eosinophils Relative: 1 % (ref 0–5)
HCT: 41.4 % (ref 36.0–46.0)
Hemoglobin: 13.8 g/dL (ref 12.0–15.0)
Lymphocytes Relative: 46 % (ref 12–46)
Lymphs Abs: 1.9 10*3/uL (ref 0.7–4.0)
MCH: 33.4 pg (ref 26.0–34.0)
MCHC: 33.3 g/dL (ref 30.0–36.0)
MCV: 100.2 fL — ABNORMAL HIGH (ref 78.0–100.0)
Monocytes Absolute: 0.4 10*3/uL (ref 0.1–1.0)
Monocytes Relative: 9 % (ref 3–12)
Neutro Abs: 1.9 10*3/uL (ref 1.7–7.7)
Neutrophils Relative %: 45 % (ref 43–77)
Platelets: 225 10*3/uL (ref 150–400)
RBC: 4.13 MIL/uL (ref 3.87–5.11)
RDW: 14.9 % (ref 11.5–15.5)
WBC: 4.2 10*3/uL (ref 4.0–10.5)

## 2011-04-27 LAB — COMPLETE METABOLIC PANEL WITH GFR
ALT: 50 U/L — ABNORMAL HIGH (ref 0–35)
AST: 56 U/L — ABNORMAL HIGH (ref 0–37)
Albumin: 3.8 g/dL (ref 3.5–5.2)
Alkaline Phosphatase: 63 U/L (ref 39–117)
BUN: 11 mg/dL (ref 6–23)
CO2: 26 mEq/L (ref 19–32)
Calcium: 8.6 mg/dL (ref 8.4–10.5)
Chloride: 104 mEq/L (ref 96–112)
Creat: 0.62 mg/dL (ref 0.50–1.10)
GFR, Est African American: >60 mL/min (ref >60)
GFR, Est Non African American: >60 mL/min (ref >60)
Glucose, Bld: 87 mg/dL (ref 70–99)
Potassium: 3.9 mEq/L (ref 3.5–5.3)
Sodium: 138 mEq/L (ref 135–145)
Total Bilirubin: 0.4 mg/dL (ref 0.3–1.2)
Total Protein: 7.3 g/dL (ref 6.0–8.3)

## 2011-04-27 LAB — URINALYSIS, MICROSCOPIC ONLY
Casts: NONE SEEN
Crystals: NONE SEEN

## 2011-04-27 LAB — LIPID PANEL
Cholesterol: 138 mg/dL (ref 0–200)
HDL: 42 mg/dL (ref >39)
LDL Cholesterol: 73 mg/dL (ref 0–99)
Total CHOL/HDL Ratio: 3.3 Ratio
Triglycerides: 115 mg/dL (ref <150)
VLDL: 23 mg/dL (ref 0–40)

## 2011-04-27 LAB — GC/CHLAMYDIA PROBE AMP, URINE
Chlamydia, Swab/Urine, PCR: NEGATIVE
GC Probe Amp, Urine: NEGATIVE

## 2011-04-27 LAB — URINALYSIS, ROUTINE W REFLEX MICROSCOPIC
Bilirubin Urine: NEGATIVE
Glucose, UA: NEGATIVE mg/dL
Hgb urine dipstick: NEGATIVE
Ketones, ur: NEGATIVE mg/dL
Nitrite: NEGATIVE
Protein, ur: NEGATIVE mg/dL
Specific Gravity, Urine: 1.012 (ref 1.005–1.030)
Urobilinogen, UA: 1 mg/dL (ref 0.0–1.0)
pH: 6 (ref 5.0–8.0)

## 2011-04-27 LAB — RPR

## 2011-04-27 LAB — HIV-1 RNA QUANT-NO REFLEX-BLD
HIV 1 RNA Quant: 48400 copies/mL — ABNORMAL HIGH (ref <20)
HIV-1 RNA Quant, Log: 4.68 {Log} — ABNORMAL HIGH (ref <1.30)

## 2011-04-27 LAB — T-HELPER CELL (CD4) - (RCID CLINIC ONLY)
CD4 % Helper T Cell: 5 % — ABNORMAL LOW (ref 33–55)
CD4 T Cell Abs: 100 uL — ABNORMAL LOW (ref 400–2700)

## 2011-04-29 ENCOUNTER — Other Ambulatory Visit: Payer: Self-pay | Admitting: Internal Medicine

## 2011-04-29 DIAGNOSIS — R300 Dysuria: Secondary | ICD-10-CM

## 2011-04-29 MED ORDER — CIPROFLOXACIN HCL 500 MG PO TABS: 500.0000 mg | ORAL_TABLET | Freq: Two times a day (BID) | ORAL | Status: DC

## 2011-04-29 NOTE — Progress Notes (Signed)
Patient is to be seen soon but has WBCs on her UA and dysuria and pyuria.  Will prescribe cipro for 3 days.  Will follow up at her clinic visit.

## 2011-04-30 ENCOUNTER — Other Ambulatory Visit: Payer: Self-pay | Admitting: *Deleted

## 2011-04-30 DIAGNOSIS — R300 Dysuria: Secondary | ICD-10-CM

## 2011-04-30 MED ORDER — CIPROFLOXACIN HCL 500 MG PO TABS: 500.0000 mg | ORAL_TABLET | Freq: Two times a day (BID) | ORAL | Status: DC

## 2011-05-10 ENCOUNTER — Ambulatory Visit: Payer: Self-pay

## 2011-05-10 ENCOUNTER — Ambulatory Visit (INDEPENDENT_AMBULATORY_CARE_PROVIDER_SITE_OTHER): Payer: Self-pay | Admitting: Adult Health

## 2011-05-10 ENCOUNTER — Encounter: Payer: Self-pay | Admitting: Adult Health

## 2011-05-10 DIAGNOSIS — B20 Human immunodeficiency virus [HIV] disease: Secondary | ICD-10-CM

## 2011-05-10 DIAGNOSIS — Z30432 Encounter for removal of intrauterine contraceptive device: Secondary | ICD-10-CM

## 2011-05-10 DIAGNOSIS — Z23 Encounter for immunization: Secondary | ICD-10-CM

## 2011-05-10 DIAGNOSIS — F319 Bipolar disorder, unspecified: Secondary | ICD-10-CM

## 2011-05-10 MED ORDER — RALTEGRAVIR POTASSIUM 400 MG PO TABS: 400.0000 mg | ORAL_TABLET | Freq: Two times a day (BID) | ORAL | Status: DC

## 2011-05-10 MED ORDER — EMTRICITABINE-TENOFOVIR DF 200-300 MG PO TABS: 1.0000 | ORAL_TABLET | Freq: Every day | ORAL | Status: DC

## 2011-05-10 MED ORDER — SULFAMETHOXAZOLE-TRIMETHOPRIM 800-160 MG PO TABS: ORAL_TABLET | ORAL | Status: DC

## 2011-05-10 NOTE — Patient Instructions (Signed)
1. Take Isentress 400 mg tablets, one tablet by mouth twice a day. 2. Take Truvada 200/300 mg tablets, one tablet by mouth daily. 3. Take dapsone, 100 mg tablets, one tablet by mouth daily (substitute in place of Bactrim.). 4. Once you receive your Bactrim, you may stop the dapsone 5. Take Benadryl (diphenhydramine), 25 mg tabs/caps, 2 by mouth each bedtime when necessary for sleep.  6. Followup with Ms. [name] regarding Nicole Silva and ADAP application. 7. Followup with gynecology regarding IUD removal and screening. Pap smear. 8. Followup with Bridge Counselor regarding referral to Behavioral Medicine. 9. Return to clinic in 4 weeks for repeat labs and schedule a followup visit for 6 weeks.

## 2011-05-20 ENCOUNTER — Telehealth: Payer: Self-pay | Admitting: *Deleted

## 2011-05-20 NOTE — Telephone Encounter (Signed)
Called patient and advised her that if she is still having issues with her IUD she will have to go to [facility] ED and have it removed. [place] card but advised her of the on site help at [employer] [facility] and [facility] [facility]. Transportation is an issue for the patient so she said she will go to the ED on Monday if she can. Will advise her [facility] counselor of this info.

## 2011-05-24 ENCOUNTER — Encounter (HOSPITAL_COMMUNITY): Payer: Self-pay | Admitting: *Deleted

## 2011-05-24 ENCOUNTER — Emergency Department (HOSPITAL_COMMUNITY)
Admission: EM | Admit: 2011-05-24 | Discharge: 2011-05-24 | Disposition: A | Discharge status: Home / Self Care | Payer: Self-pay | Attending: Emergency Medicine | Admitting: Emergency Medicine

## 2011-05-24 DIAGNOSIS — F4310 Post-traumatic stress disorder, unspecified: Secondary | ICD-10-CM | POA: Insufficient documentation

## 2011-05-24 DIAGNOSIS — F329 Major depressive disorder, single episode, unspecified: Secondary | ICD-10-CM | POA: Insufficient documentation

## 2011-05-24 DIAGNOSIS — Z21 Asymptomatic human immunodeficiency virus [HIV] infection status: Secondary | ICD-10-CM | POA: Insufficient documentation

## 2011-05-24 DIAGNOSIS — Z30432 Encounter for removal of intrauterine contraceptive device: Secondary | ICD-10-CM | POA: Insufficient documentation

## 2011-05-24 DIAGNOSIS — F17200 Nicotine dependence, unspecified, uncomplicated: Secondary | ICD-10-CM | POA: Insufficient documentation

## 2011-05-24 DIAGNOSIS — F32A Depression, unspecified: Secondary | ICD-10-CM | POA: Insufficient documentation

## 2011-05-24 DIAGNOSIS — F3289 Other specified depressive episodes: Secondary | ICD-10-CM | POA: Insufficient documentation

## 2011-05-24 DIAGNOSIS — B9689 Other specified bacterial agents as the cause of diseases classified elsewhere: Secondary | ICD-10-CM | POA: Insufficient documentation

## 2011-05-24 DIAGNOSIS — B182 Chronic viral hepatitis C: Secondary | ICD-10-CM | POA: Insufficient documentation

## 2011-05-24 DIAGNOSIS — A499 Bacterial infection, unspecified: Secondary | ICD-10-CM | POA: Insufficient documentation

## 2011-05-24 DIAGNOSIS — N760 Acute vaginitis: Secondary | ICD-10-CM | POA: Insufficient documentation

## 2011-05-24 HISTORY — DX: Major depressive disorder, single episode, unspecified: F32.9

## 2011-05-24 HISTORY — DX: Human immunodeficiency virus (HIV) disease: B20

## 2011-05-24 HISTORY — DX: Depression, unspecified: F32.A

## 2011-05-24 HISTORY — DX: Other specified demyelinating diseases of central nervous system: G37.8

## 2011-05-24 HISTORY — DX: Chronic viral hepatitis C: B18.2

## 2011-05-24 HISTORY — DX: Post-traumatic stress disorder, unspecified: F43.10

## 2011-05-24 LAB — URINE MICROSCOPIC-ADD ON

## 2011-05-24 LAB — URINALYSIS, ROUTINE W REFLEX MICROSCOPIC
Glucose, UA: NEGATIVE mg/dL
Hgb urine dipstick: NEGATIVE
Ketones, ur: NEGATIVE mg/dL
Nitrite: NEGATIVE
Protein, ur: NEGATIVE mg/dL
Specific Gravity, Urine: 1.025 (ref 1.005–1.030)
Urobilinogen, UA: 0.2 mg/dL (ref 0.0–1.0)
pH: 6 (ref 5.0–8.0)

## 2011-05-24 LAB — WET PREP, GENITAL
Trich, Wet Prep: NONE SEEN
Yeast Wet Prep HPF POC: NONE SEEN

## 2011-05-24 LAB — PREGNANCY, URINE: Preg Test, Ur: NEGATIVE

## 2011-05-24 MED ORDER — METRONIDAZOLE 500 MG PO TABS: 500.0000 mg | ORAL_TABLET | Freq: Two times a day (BID) | ORAL | Status: AC

## 2011-05-24 MED ORDER — AZITHROMYCIN 250 MG PO TABS
1000.0000 mg | ORAL_TABLET | Freq: Once | ORAL | Status: AC
  Administered 2011-05-24: 1000 mg via ORAL

## 2011-05-24 MED ORDER — DOXYCYCLINE HYCLATE 100 MG PO CAPS: 100.0000 mg | ORAL_CAPSULE | Freq: Two times a day (BID) | ORAL | Status: AC

## 2011-05-24 MED ORDER — AZITHROMYCIN 250 MG PO TABS
ORAL_TABLET | ORAL | Status: AC
  Administered 2011-05-24: 1000 mg via ORAL
  Filled 2011-05-24: qty 4

## 2011-05-24 MED ORDER — OXYCODONE-ACETAMINOPHEN 5-325 MG PO TABS: 1.0000 | ORAL_TABLET | ORAL | Status: AC | PRN

## 2011-05-24 MED ORDER — AZITHROMYCIN 1 G PO PACK
1.0000 g | PACK | Freq: Once | ORAL | Status: DC
  Filled 2011-05-24: qty 1

## 2011-05-24 MED ORDER — OXYCODONE-ACETAMINOPHEN 5-325 MG PO TABS
1.0000 | ORAL_TABLET | Freq: Once | ORAL | Status: AC
  Administered 2011-05-24: 1 via ORAL
  Filled 2011-05-24: qty 1

## 2011-05-24 NOTE — ED Notes (Signed)
Has had increased vaginal bleeding, vaginal discharge, and vaginal pain, worse of last 2 months from IUD.  States is due for it to come out and she has no insurance so [employer], who inserted IUD, will not take it out.  C/o vaginal pain and small amount of yellow vaginal discharge at this time.

## 2011-05-24 NOTE — ED Notes (Signed)
IUD removed per Dr. Ignacia Palma during pelvic exam.

## 2011-05-24 NOTE — ED Provider Notes (Signed)
History   Chart scribed for Carleene Cooper III, MD by Enos Fling; the patient was seen in room APA09/APA09; this patient's care was started at 9:11 AM.    CSN: [id number] Arrival date & time: 05/24/2011  8:51 AM  Chief Complaint  Patient presents with  . Vaginal Pain   HPI Nicole Silva is a 40 y.o. female who presents to the Emergency Department complaining of vaginal pain. Pt states she has had IUD in place for past 6 years with no issues, was supposed to have it removed last year but unable to d/t insurance. Pt c/o intermittent vaginal pain x 8 months that has been much worse in past 2 months with associated vaginal discharge yellow in color. Also reports vaginal bleeding x 5 days last week that has resolved. Reports physician who is treating her HIV recommended removal of IUD. Pt denies abd pain, n/v, or fever.   No PCP but has new physician (unsure of name) at [facility] clinic for HIV tx.  Past Medical History  Diagnosis Date  . HIV disease   . Hep C w/o coma, chronic   . 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 PTSD (post-traumatic stress disorder)   . Depression     Past Surgical History  Procedure Date  . Neck surgery   . Arm surgery     right elbow  . Vagina surgery     vulvarian cyst removal    No family history on file.  History  Substance Use Topics  . Smoking status: Current Everyday Smoker    Types: Cigarettes  . Smokeless tobacco: Never Used  . Alcohol Use: No  Smoker 0.5 ppd  OB History    Grav Para Term Preterm Abortions TAB SAB Ect Mult Living                 Previous Medications   DAPSONE 100 MG TABLET    Take 100 mg by mouth daily.     EMTRICITABINE-TENOFOVIR (TRUVADA) 200-300 MG PER TABLET    Take 1 tablet by mouth daily.   IBUPROFEN (ADVIL,MOTRIN) 200 MG TABLET    Take 200 mg by mouth every 6 (six) hours as needed. pain    RALTEGRAVIR (ISENTRESS) 400 MG TABLET    Take 1 tablet (400 mg total) by  mouth 2 (two) times daily.   SULFAMETHOXAZOLE-TRIMETHOPRIM (BACTRIM DS,SEPTRA DS) 800-160 MG PER TABLET    One tablet by mouth daily     Allergies as of 05/24/2011 - Review Complete 05/24/2011  Allergen Reaction Noted  . Food  05/24/2011  . Penicillins        Review of Systems  Constitutional: Negative for fever and unexpected weight change.  HENT: Negative for ear pain and sore throat.   Eyes: Negative for visual disturbance.  Respiratory: Positive for cough.        "smoker's cough"  Cardiovascular: Negative for chest pain.  Gastrointestinal: Negative for vomiting and diarrhea.  Genitourinary: Positive for vaginal bleeding, vaginal discharge and vaginal pain.       Pressure with urination  Musculoskeletal: Negative for myalgias.  Skin: Negative for rash.  Neurological: Positive for dizziness. Negative for seizures and syncope.       2 mild, brief episodes with cleaning her apartment over past few weeks  Hematological: Negative.   Psychiatric/Behavioral: Negative.     Physical Exam  BP 108/65  Pulse 72  Temp(Src) 97.7 F (36.5 C) (Oral)  Resp  19  Ht 5' 5.5" (1.664 m)  Wt 205 lb (92.987 kg)  BMI 33.59 kg/m2  SpO2 96%  Physical Exam  Nursing note and vitals reviewed. Constitutional: She is oriented to person, place, and time. She appears well-developed and well-nourished. No distress.  HENT:  Head: Normocephalic.  Mouth/Throat: Mucous membranes are normal.  Eyes: Conjunctivae are normal. Pupils are equal, round, and reactive to light.  Neck: Normal range of motion. Neck supple.  Cardiovascular: Normal rate, regular rhythm and intact distal pulses.  Exam reveals no gallop and no friction rub.   No murmur heard. Pulmonary/Chest: Effort normal and breath sounds normal. She has no wheezes. She has no rales.  Abdominal: Soft. There is no tenderness.  Musculoskeletal: Normal range of motion.  Neurological: She is alert and oriented to person, place, and time.  Skin: Skin  is warm and dry. No rash noted.  Psychiatric: She has a normal mood and affect.    ED Course  Pelvic exam Date/Time: 05/24/2011 9:50 AM Performed by: Osvaldo Human Authorized by: Osvaldo Human Consent: Verbal consent obtained. Risks and benefits: risks, benefits and alternatives were discussed Consent given by: patient Patient understanding: patient states understanding of the procedure being performed Patient consent: the patient's understanding of the procedure matches consent given Required items: required blood products, implants, devices, and special equipment available Patient identity confirmed: verbally with patient Time out: Immediately prior to procedure a "time out" was called to verify the correct patient, procedure, equipment, support staff and site/side marked as required. Local anesthesia used: no Patient sedated: no Patient tolerance: Patient tolerated the procedure well with no immediate complications (GC and chlamydia and Wet Prep specimens obtained.). Comments: Normal female external genitalia.  Has minimal whitish discharge.  Cervix normal with strings form IUD coming out.  Strings grasped with ring forceps and IUD removed.  Pt tolerated well.  Will Rx for infection with Rocephin and Zithromax, and doxycycline 100 mg bid x 14 days to protect from infection post removal of IUD.  Rx hydrocodone-acetaminophen q4h prn pain.    OTHER DATA REVIEWED: Nursing notes and vital signs reviewed. Prior records reviewed.   LABS / RADIOLOGY: Results for orders placed during the hospital encounter of 05/24/11  WET PREP, GENITAL      Component Value Range   Yeast, Wet Prep NONE SEEN  NONE SEEN    Trich, Wet Prep NONE SEEN  NONE SEEN    Clue Cells, Wet Prep FEW (*) NONE SEEN    WBC, Wet Prep HPF POC FEW (*) NONE SEEN   URINALYSIS, ROUTINE W REFLEX MICROSCOPIC      Component Value Range   Color, Urine YELLOW  YELLOW    Appearance HAZY (*) CLEAR    Specific Gravity,  Urine 1.025  1.005 - 1.030    pH 6.0  5.0 - 8.0    Glucose, UA NEGATIVE  NEGATIVE (mg/dL)   Hgb urine dipstick NEGATIVE  NEGATIVE    Bilirubin Urine SMALL (*) NEGATIVE    Ketones, ur NEGATIVE  NEGATIVE (mg/dL)   Protein, ur NEGATIVE  NEGATIVE (mg/dL)   Urobilinogen, UA 0.2  0.0 - 1.0 (mg/dL)   Nitrite NEGATIVE  NEGATIVE    Leukocytes, UA TRACE (*) NEGATIVE   PREGNANCY, URINE      Component Value Range   Preg Test, Ur NEGATIVE    URINE MICROSCOPIC-ADD ON      Component Value Range   Squamous Epithelial / LPF MANY (*) RARE    WBC, UA  7-10  <3 (WBC/hpf)   RBC / HPF 0-2  <3 (RBC/hpf)   Bacteria, UA MANY (*) RARE     ED COURSE:  10:22 AM Pt waiting for lab results, complains of pelvic pain.  Rx Percocet.  Could not give Rocephin because of Hx of penicillin allergy.  10:55 AM Lab workup shows a few clue cells, 7-10 WBCs in UA, UPG negative.  Rx Doxycycline 100 mg po bid x 14 days, Metronidazole 500 mg bid x 7 days, Percocet q4h prn pain.   IMPRESSION: 1. Bacterial vaginosis   2. Encounter for IUD removal      PLAN: Discharge All results reviewed and discussed with pt, questions answered, pt agreeable with plan.   CONDITION ON DISCHARGE: Stable   MEDS GIVEN IN ED: azithromycin (ZITHROMAX) tablet 1,000 mg (1000 mg Oral Given 05/24/11 1008)  oxyCODONE-acetaminophen (PERCOCET) 5-325 MG per tablet 1 tablet (1 tablet Oral Given 05/24/11 1030)     DISCHARGE MEDICATIONS: New Prescriptions   DOXYCYCLINE (VIBRAMYCIN) 100 MG CAPSULE    Take 1 capsule (100 mg total) by mouth 2 (two) times daily.   METRONIDAZOLE (FLAGYL) 500 MG TABLET    Take 1 tablet (500 mg total) by mouth 2 (two) times daily.   OXYCODONE-ACETAMINOPHEN (PERCOCET) 5-325 MG PER TABLET    Take 1 tablet by mouth every 4 (four) hours as needed for pain.     SCRIBE ATTESTATION: I personally performed the services described in this documentation, which was scribed in my presence. The recorded information has been  reviewed and considered. Osvaldo Human, MD     Carleene Cooper III, MD 05/24/11 [phone]

## 2011-05-25 LAB — GC/CHLAMYDIA PROBE AMP, GENITAL
Chlamydia, DNA Probe: NEGATIVE
GC Probe Amp, Genital: NEGATIVE

## 2011-05-25 LAB — URINE CULTURE
Colony Count: 45000
Culture  Setup Time: 201209101356

## 2011-06-04 LAB — T-HELPER CELL (CD4) - (RCID CLINIC ONLY)
CD4 % Helper T Cell: 9 — ABNORMAL LOW
CD4 T Cell Abs: 170 — ABNORMAL LOW

## 2011-06-07 NOTE — Progress Notes (Signed)
Subjective:    Patient ID: Nicole Silva, female    DOB: Apr 25, 1971, 40 y.o.   MRN: [id number]  HPI 40 year old, white female with a history of HIV, with sporadic adherence to both medications and followup. Has not been seen in clinic . Since December 2011. She states that she had been incarcerated for some period of time and then, was unable to get back to clinic for followup. She is not certain how long she has been off her medications, but she states it has been "for some time." She states she wasn't given her appropriate medications. While she was in prison. Additionally, she has a history of bipolar disorder, for which she was seen. Mental health for this reason. She has not seen mental health providers since her release from prison. Also she states that she has a "lump" in her neck. It is nonpainful, but disturbing. Additionally, she complains of having an IUD in place for "a long period of time," and is asking [place] if we would remove it for her.   Review of Systems  Constitutional: Positive for activity change and fatigue.  HENT: Negative.   Eyes: Negative.   Respiratory: Negative.   Cardiovascular: Negative.   Gastrointestinal: Negative.   Genitourinary: Positive for menstrual problem.       As per history of present illness  Musculoskeletal: Positive for back pain and arthralgias.  Skin: Negative.   Neurological: Negative.   Hematological: Positive for adenopathy.  Psychiatric/Behavioral: Positive for behavioral problems, decreased concentration and agitation. Negative for suicidal ideas, sleep disturbance and self-injury. The patient is nervous/anxious and is hyperactive.        Objective:   Physical Exam  Constitutional: She is oriented to person, place, and time. She appears well-developed.       Overweight appearing  HENT:  Head: Normocephalic and atraumatic.  Right Ear: External ear normal.  Left Ear: External ear normal.  Nose: Nose normal.  Mouth/Throat:  Oropharynx is clear and moist. No oropharyngeal exudate.  Eyes: Conjunctivae and EOM are normal. Pupils are equal, round, and reactive to light. Right eye exhibits no discharge. Left eye exhibits no discharge. No scleral icterus.  Neck: Normal range of motion. Neck supple. No JVD present. No tracheal deviation present. No thyromegaly present.  Cardiovascular: Normal rate, regular rhythm, normal heart sounds and intact distal pulses.   Pulmonary/Chest: Effort normal and breath sounds normal. No stridor.  Abdominal: Soft. Bowel sounds are normal. She exhibits no distension and no mass. There is no tenderness. There is no rebound and no guarding.  Musculoskeletal: Normal range of motion.  Lymphadenopathy:       Head (right side): Submandibular adenopathy present.       Head (left side): Submandibular adenopathy present.    She has no cervical adenopathy.       Lymph nodes shotty in nature, nontender.  Neurological: She is alert and oriented to person, place, and time. No cranial nerve deficit. She exhibits normal muscle tone. Coordination normal.  Skin: Skin is warm and dry. No rash noted. She is not diaphoretic. No erythema. No pallor.  Psychiatric: Her mood appears anxious. Her speech is rapid and/or pressured. She is is hyperactive. She is not agitated, not aggressive, not slowed, not withdrawn, not actively hallucinating and not combative. Thought content is not delusional. Cognition and memory are normal. She expresses impulsivity. She does not express inappropriate judgment. She is attentive.          Assessment & Plan:  1. HIV. Labs  obtained on 04/26/2011. Show a CD4 count of 100 at 5% with a viral load of 48,400 copies per mL. Given her history of both sporadic. Adherence and followup. We decided on changing her therapy to Truvada, plus Isentress. Drug regimen, side effects, adverse drug reactions, and potential toxicities were discussed in detail. We will ask that she return in 4 weeks  for repeat labs and followup in 6 weeks.  2. IUD. She was informed that we do not remove IUDs in this clinic and that she should refer herself to the women's clinic to have them evaluate this as well as perform her routine surveillance. Pap smear.  3. Bipolar Disorder. According to records, she is currently not any medications for her condition. We recommend a referral to her local mental health services for evaluation and medication treatment. As this is where she is at her treatment. Previously.  4. Submandibular Lymph Nodes. These are isolated only to one chain could be associated with many factors, but they are shotty in nature, which implies chronicity.  At present, we will just monitor this. If these become larger or painful, we'll arrange for biopsy.  She verbally acknowledged all information was provided to her agree with plan of care.

## 2011-06-18 ENCOUNTER — Other Ambulatory Visit (INDEPENDENT_AMBULATORY_CARE_PROVIDER_SITE_OTHER): Payer: Self-pay

## 2011-06-18 ENCOUNTER — Other Ambulatory Visit: Payer: Self-pay | Admitting: Infectious Diseases

## 2011-06-18 DIAGNOSIS — Z113 Encounter for screening for infections with a predominantly sexual mode of transmission: Secondary | ICD-10-CM

## 2011-06-18 DIAGNOSIS — Z79899 Other long term (current) drug therapy: Secondary | ICD-10-CM

## 2011-06-18 DIAGNOSIS — B20 Human immunodeficiency virus [HIV] disease: Secondary | ICD-10-CM

## 2011-06-18 LAB — T-HELPER CELL (CD4) - (RCID CLINIC ONLY)
CD4 % Helper T Cell: 5 % — ABNORMAL LOW (ref 33–55)
CD4 T Cell Abs: 90 uL — ABNORMAL LOW (ref 400–2700)

## 2011-06-19 LAB — COMPLETE METABOLIC PANEL WITH GFR
ALT: 43 U/L — ABNORMAL HIGH (ref 0–35)
AST: 43 U/L — ABNORMAL HIGH (ref 0–37)
Albumin: 3.9 g/dL (ref 3.5–5.2)
Alkaline Phosphatase: 84 U/L (ref 39–117)
BUN: 14 mg/dL (ref 6–23)
CO2: 26 mEq/L (ref 19–32)
Calcium: 9.1 mg/dL (ref 8.4–10.5)
Chloride: 103 mEq/L (ref 96–112)
Creat: 0.94 mg/dL (ref 0.50–1.10)
GFR, Est African American: >60 mL/min (ref >60)
GFR, Est Non African American: >60 mL/min (ref >60)
Glucose, Bld: 104 mg/dL — ABNORMAL HIGH (ref 70–99)
Potassium: 4.8 mEq/L (ref 3.5–5.3)
Sodium: 137 mEq/L (ref 135–145)
Total Bilirubin: 0.4 mg/dL (ref 0.3–1.2)
Total Protein: 7.1 g/dL (ref 6.0–8.3)

## 2011-06-19 LAB — URINALYSIS, ROUTINE W REFLEX MICROSCOPIC
Bilirubin Urine: NEGATIVE
Glucose, UA: NEGATIVE mg/dL
Hgb urine dipstick: NEGATIVE
Ketones, ur: NEGATIVE mg/dL
Leukocytes, UA: NEGATIVE
Nitrite: NEGATIVE
Protein, ur: NEGATIVE mg/dL
Specific Gravity, Urine: 1.012 (ref 1.005–1.030)
Urobilinogen, UA: 0.2 mg/dL (ref 0.0–1.0)
pH: 6 (ref 5.0–8.0)

## 2011-06-19 LAB — CBC WITH DIFFERENTIAL/PLATELET
Basophils Absolute: 0 10*3/uL (ref 0.0–0.1)
Basophils Relative: 0 % (ref 0–1)
Eosinophils Absolute: 0 10*3/uL (ref 0.0–0.7)
Eosinophils Relative: 1 % (ref 0–5)
HCT: 40.6 % (ref 36.0–46.0)
Hemoglobin: 13.2 g/dL (ref 12.0–15.0)
Lymphocytes Relative: 47 % — ABNORMAL HIGH (ref 12–46)
Lymphs Abs: 1.8 10*3/uL (ref 0.7–4.0)
MCH: 34.3 pg — ABNORMAL HIGH (ref 26.0–34.0)
MCHC: 32.5 g/dL (ref 30.0–36.0)
MCV: 105.5 fL — ABNORMAL HIGH (ref 78.0–100.0)
Monocytes Absolute: 0.3 10*3/uL (ref 0.1–1.0)
Monocytes Relative: 9 % (ref 3–12)
Neutro Abs: 1.7 10*3/uL (ref 1.7–7.7)
Neutrophils Relative %: 44 % (ref 43–77)
Platelets: 269 10*3/uL (ref 150–400)
RBC: 3.85 MIL/uL — ABNORMAL LOW (ref 3.87–5.11)
RDW: 15.4 % (ref 11.5–15.5)
WBC: 3.9 10*3/uL — ABNORMAL LOW (ref 4.0–10.5)

## 2011-06-19 LAB — GC/CHLAMYDIA PROBE AMP, URINE
Chlamydia, Swab/Urine, PCR: NEGATIVE
GC Probe Amp, Urine: NEGATIVE

## 2011-06-19 LAB — LIPID PANEL
Cholesterol: 144 mg/dL (ref 0–200)
HDL: 40 mg/dL (ref >39)
LDL Cholesterol: 76 mg/dL (ref 0–99)
Total CHOL/HDL Ratio: 3.6 Ratio
Triglycerides: 141 mg/dL (ref <150)
VLDL: 28 mg/dL (ref 0–40)

## 2011-06-19 LAB — RPR

## 2011-06-21 LAB — HIV-1 RNA QUANT-NO REFLEX-BLD
HIV 1 RNA Quant: 48 copies/mL — ABNORMAL HIGH (ref <20)
HIV-1 RNA Quant, Log: 1.68 {Log} — ABNORMAL HIGH (ref <1.30)

## 2011-06-22 LAB — T-HELPER CELL (CD4) - (RCID CLINIC ONLY)
CD4 % Helper T Cell: 7 — ABNORMAL LOW
CD4 T Cell Abs: 150 — ABNORMAL LOW

## 2011-06-25 LAB — CBC
HCT: 42
Hemoglobin: 15
MCHC: 35.6
MCV: 101.1 — ABNORMAL HIGH
Platelets: 344
RBC: 4.15
RDW: 13.4
WBC: 6.4

## 2011-06-25 LAB — APTT: aPTT: 27

## 2011-06-25 LAB — PROTIME-INR
INR: 0.9
Prothrombin Time: 11.8

## 2011-06-25 LAB — HCG, SERUM, QUALITATIVE: Preg, Serum: NEGATIVE

## 2011-06-28 LAB — T-HELPER CELL (CD4) - (RCID CLINIC ONLY)
CD4 % Helper T Cell: 7 — ABNORMAL LOW
CD4 T Cell Abs: 160 — ABNORMAL LOW

## 2011-07-02 ENCOUNTER — Other Ambulatory Visit: Payer: Self-pay | Admitting: *Deleted

## 2011-07-02 ENCOUNTER — Ambulatory Visit (INDEPENDENT_AMBULATORY_CARE_PROVIDER_SITE_OTHER): Payer: Self-pay | Admitting: Infectious Diseases

## 2011-07-02 ENCOUNTER — Ambulatory Visit: Payer: Self-pay | Admitting: Infectious Diseases

## 2011-07-02 ENCOUNTER — Encounter: Payer: Self-pay | Admitting: Infectious Diseases

## 2011-07-02 VITALS — BP 99/67 | HR 80 | Temp 98.1°F | Ht 65.5 in | Wt 212.0 lb

## 2011-07-02 DIAGNOSIS — B20 Human immunodeficiency virus [HIV] disease: Secondary | ICD-10-CM

## 2011-07-02 DIAGNOSIS — N920 Excessive and frequent menstruation with regular cycle: Secondary | ICD-10-CM

## 2011-07-02 DIAGNOSIS — M545 Low back pain, unspecified: Secondary | ICD-10-CM | POA: Insufficient documentation

## 2011-07-02 DIAGNOSIS — B1710 Acute hepatitis C without hepatic coma: Secondary | ICD-10-CM

## 2011-07-02 MED ORDER — CYCLOBENZAPRINE HCL 5 MG PO TABS: 5.0000 mg | ORAL_TABLET | Freq: Three times a day (TID) | ORAL | Status: DC | PRN

## 2011-07-02 NOTE — Assessment & Plan Note (Signed)
She has GYN f/u in [place] At the health dept.

## 2011-07-02 NOTE — Assessment & Plan Note (Signed)
Will cont to watch LFTs. She is hep A immune.

## 2011-07-02 NOTE — Assessment & Plan Note (Signed)
She does not want narcotic, she is recovering substance abuser. Will try flexeril.

## 2011-07-02 NOTE — Assessment & Plan Note (Signed)
She is doing very well on her new meds. Will have her back in 4- 5 months. She is offered condoms (refused), has gotten flu shot.

## 2011-07-02 NOTE — Progress Notes (Signed)
  Subjective:    Patient ID: Nicole Silva, female    DOB: October 14, 1970, 40 y.o.   MRN: [id number]  HPI 40 yo F with hx of HIV+ (since 23s) and Bipolar d/o. Today complains of low back pain but she believes it is related to fluid retention due to taking Paxil. Taking ISN/TRV since last visit- had some n/v first few days but has since improved. Was referred to [facility] and had IUD removed. This has since made her back pain worse. She has a GYN appt to follow this up.  CD4 100 --> 90, VL 48,000 --> 48 (06-18-11).   Review of Systems  Musculoskeletal: Positive for back pain.       Objective:   Physical Exam  Constitutional: She appears well-developed and well-nourished.  HENT:  Mouth/Throat: No oropharyngeal exudate.  Eyes: EOM are normal. Pupils are equal, round, and reactive to light.  Cardiovascular: Normal rate, regular rhythm and normal heart sounds.   Pulmonary/Chest: Effort normal and breath sounds normal.  Abdominal: Soft. Bowel sounds are normal. There is no tenderness. There is no rebound.  Lymphadenopathy:    She has no cervical adenopathy.  Psychiatric: She has a normal mood and affect. Thought content normal.          Assessment & Plan:

## 2011-07-05 ENCOUNTER — Encounter: Payer: Self-pay | Admitting: Adult Health

## 2011-07-06 ENCOUNTER — Telehealth: Payer: Self-pay | Admitting: *Deleted

## 2011-07-06 NOTE — Telephone Encounter (Signed)
States she was here last week. She now has a noticible rash on her face, in the shape of a butterfly. Wants md to know. If meds are ordered she uses [facility] on [address] in [place]

## 2011-07-06 NOTE — Progress Notes (Signed)
BC discharged pt from the bridge counseling program today. Pt is working with the [facility] in [place].

## 2011-07-14 NOTE — Telephone Encounter (Signed)
States her last dose was 3 days ago. Told her to wait another week without the fexeril & see if rash clears. Call back if not. She agreed with plan

## 2011-07-14 NOTE — Telephone Encounter (Signed)
She was previously started on flexeril. She should stop this, let us know if rash not improved.

## 2011-07-21 ENCOUNTER — Emergency Department (HOSPITAL_COMMUNITY): Payer: Self-pay

## 2011-07-21 ENCOUNTER — Encounter (HOSPITAL_COMMUNITY): Payer: Self-pay

## 2011-07-21 ENCOUNTER — Emergency Department (HOSPITAL_COMMUNITY)
Admission: EM | Admit: 2011-07-21 | Discharge: 2011-07-21 | Disposition: A | Discharge status: Home / Self Care | Payer: Self-pay | Attending: Emergency Medicine | Admitting: Emergency Medicine

## 2011-07-21 DIAGNOSIS — R509 Fever, unspecified: Secondary | ICD-10-CM | POA: Insufficient documentation

## 2011-07-21 DIAGNOSIS — B20 Human immunodeficiency virus [HIV] disease: Secondary | ICD-10-CM | POA: Insufficient documentation

## 2011-07-21 DIAGNOSIS — N760 Acute vaginitis: Secondary | ICD-10-CM | POA: Insufficient documentation

## 2011-07-21 DIAGNOSIS — A499 Bacterial infection, unspecified: Secondary | ICD-10-CM | POA: Insufficient documentation

## 2011-07-21 DIAGNOSIS — N7011 Chronic salpingitis: Secondary | ICD-10-CM

## 2011-07-21 DIAGNOSIS — B9689 Other specified bacterial agents as the cause of diseases classified elsewhere: Secondary | ICD-10-CM | POA: Insufficient documentation

## 2011-07-21 LAB — BASIC METABOLIC PANEL
BUN: 9 mg/dL (ref 6–23)
CO2: 25 mEq/L (ref 19–32)
Calcium: 9 mg/dL (ref 8.4–10.5)
Chloride: 105 mEq/L (ref 96–112)
Creatinine, Ser: 0.82 mg/dL (ref 0.50–1.10)
GFR calc Af Amer: >90 mL/min (ref >90)
GFR calc non Af Amer: 87 mL/min — ABNORMAL LOW (ref >90)
Glucose, Bld: 111 mg/dL — ABNORMAL HIGH (ref 70–99)
Potassium: 4.1 mEq/L (ref 3.5–5.1)
Sodium: 136 mEq/L (ref 135–145)

## 2011-07-21 LAB — URINALYSIS, ROUTINE W REFLEX MICROSCOPIC
Bilirubin Urine: NEGATIVE
Glucose, UA: NEGATIVE mg/dL
Hgb urine dipstick: NEGATIVE
Ketones, ur: NEGATIVE mg/dL
Leukocytes, UA: NEGATIVE
Nitrite: NEGATIVE
Protein, ur: NEGATIVE mg/dL
Specific Gravity, Urine: >1.03 — ABNORMAL HIGH (ref 1.005–1.030)
Urobilinogen, UA: 0.2 mg/dL (ref 0.0–1.0)
pH: 6 (ref 5.0–8.0)

## 2011-07-21 LAB — DIFFERENTIAL
Basophils Absolute: 0 10*3/uL (ref 0.0–0.1)
Basophils Relative: 0 % (ref 0–1)
Eosinophils Absolute: 0.1 10*3/uL (ref 0.0–0.7)
Eosinophils Relative: 1 % (ref 0–5)
Lymphocytes Relative: 41 % (ref 12–46)
Lymphs Abs: 1.5 10*3/uL (ref 0.7–4.0)
Monocytes Absolute: 0.4 10*3/uL (ref 0.1–1.0)
Monocytes Relative: 11 % (ref 3–12)
Neutro Abs: 1.6 10*3/uL — ABNORMAL LOW (ref 1.7–7.7)
Neutrophils Relative %: 47 % (ref 43–77)

## 2011-07-21 LAB — CBC
HCT: 37 % (ref 36.0–46.0)
Hemoglobin: 12.9 g/dL (ref 12.0–15.0)
MCH: 35.6 pg — ABNORMAL HIGH (ref 26.0–34.0)
MCHC: 34.9 g/dL (ref 30.0–36.0)
MCV: 102.2 fL — ABNORMAL HIGH (ref 78.0–100.0)
Platelets: 232 10*3/uL (ref 150–400)
RBC: 3.62 MIL/uL — ABNORMAL LOW (ref 3.87–5.11)
RDW: 13.6 % (ref 11.5–15.5)
WBC: 3.5 10*3/uL — ABNORMAL LOW (ref 4.0–10.5)

## 2011-07-21 LAB — WET PREP, GENITAL
Trich, Wet Prep: NONE SEEN
Yeast Wet Prep HPF POC: NONE SEEN

## 2011-07-21 LAB — PREGNANCY, URINE: Preg Test, Ur: NEGATIVE

## 2011-07-21 MED ORDER — ONDANSETRON HCL 4 MG/2ML IJ SOLN
4.0000 mg | Freq: Once | INTRAMUSCULAR | Status: AC
  Administered 2011-07-21: 4 mg via INTRAVENOUS
  Filled 2011-07-21: qty 2

## 2011-07-21 MED ORDER — KETOROLAC TROMETHAMINE 30 MG/ML IJ SOLN
30.0000 mg | Freq: Once | INTRAMUSCULAR | Status: AC
  Administered 2011-07-21: 30 mg via INTRAVENOUS
  Filled 2011-07-21: qty 1

## 2011-07-21 MED ORDER — MORPHINE SULFATE 4 MG/ML IJ SOLN
4.0000 mg | Freq: Once | INTRAMUSCULAR | Status: AC
  Administered 2011-07-21: 4 mg via INTRAVENOUS
  Filled 2011-07-21: qty 1

## 2011-07-21 MED ORDER — SULFAMETHOXAZOLE-TRIMETHOPRIM 800-160 MG PO TABS: ORAL_TABLET | ORAL | Status: DC

## 2011-07-21 MED ORDER — METRONIDAZOLE 500 MG PO TABS: 500.0000 mg | ORAL_TABLET | Freq: Two times a day (BID) | ORAL | Status: AC

## 2011-07-21 MED ORDER — CIPROFLOXACIN HCL 750 MG PO TABS: 750.0000 mg | ORAL_TABLET | Freq: Two times a day (BID) | ORAL | Status: AC

## 2011-07-21 MED ORDER — OXYCODONE-ACETAMINOPHEN 5-325 MG PO TABS: 2.0000 | ORAL_TABLET | Freq: Four times a day (QID) | ORAL | Status: AC | PRN

## 2011-07-21 NOTE — ED Notes (Signed)
Pt reports having an IUD taking out here a month ago.  Pt states that since then she has had a yellow discharge that has an odor.  Pt reports going to the health dept and they referred her here.  Pt also reports pelvic pain.

## 2011-07-21 NOTE — ED Notes (Signed)
Dr. Emelda Fear at bedside to discuss plan of care with patient. RN at bedside to assist in evaluation by MD.

## 2011-07-21 NOTE — ED Notes (Signed)
Subjective: 40 year old female home was residing in a motel seen in the emergency room for follow up of chronic lower abdominal discomfort. The patient has a long-standing history of an IUD in place 5-1/2 years, recently removed in the emergency room and [facility] and treated post removal with 7-10 days of antibiotics. The patient was having low-grade discomfort the time of removal of the IUD since removal of the low-grade discomfort has progressed somewhat intensified. she denies fevers. She is tolerating food hungry and interested in outpatient management she has financial support to obtain prescriptions.  Past medical history positive for HIV, positive for hepatitis C with CD4 count in the 90 range           Physical Exam: Blood pressure 106/61, pulse 75, temperature 97.8 F (36.6 C), temperature source Oral, resp. rate 16, height 5' 5.5" (1.664 m), weight 92.987 kg (205 lb), SpO2 97.00%. Abdominal exam notable for a mildly tender lower abdomen without guarding No rebound tenderness Review of ultrasound shows a 3 x 5 cm tubular structure in the adnexa consistent with hydrosalpinx probably of long-standing duration.  Investigations:  Recent Results (from the past 240 hour(s))  WET PREP, GENITAL     Status: Abnormal   Collection Time   07/21/11 10:49 AM      Component Value Range Status Comment   Yeast, Wet Prep NONE SEEN  NONE SEEN  Final    Trich, Wet Prep NONE SEEN  NONE SEEN  Final    Clue Cells, Wet Prep MANY (*) NONE SEEN  Final    WBC, Wet Prep HPF POC FEW (*) NONE SEEN  Final     US Transvaginal Non-ob  07/21/2011  *RADIOLOGY REPORT*  Clinical Data: IUD removed 1 month ago, now with pelvic pain, evaluate for tubal ovarian abscess  TRANSABDOMINAL AND TRANSVAGINAL ULTRASOUND OF PELVIS Technique:  Both transabdominal and transvaginal ultrasound examinations of the pelvis were performed. Transabdominal technique was performed for global imaging of the pelvis including  uterus, ovaries, adnexal regions, and pelvic cul-de-sac.  Comparison: Pelvic ultrasound - 02/27/2007   It was necessary to proceed with endovaginal exam following the transabdominal exam to visualize the endometrium and bilateral ovaries.  Findings:  Uterus: The uterus is anteverted and normal in size, measuring 7.2 x 3.7 x 5.4 cm.  No discrete uterine mass.  Endometrium: Normal in size measuring 4.3 mm in diameter  Right ovary:  Normal in size measuring 2.4 x 1.5 x 2.2 cm.  No right-sided ovarian lesions.  Left ovary: Normal in size measuring 2.6 x 1.5 x 3.0 cm.  There is approximate 1.3 x 1.4 x 0.6 cm hypoechoic lesion within the left ovary, possibly a dominant follicle.  Other findings: There is an approximately 6.0 x 3.5 x 3.9 cm largely anechoic serpiginous/tubular lesion which demonstrates internal echoes which is favored to represent a dilated fallopian tube.  IMPRESSION:  Dilated approximately 6 cm apparent tubular structure within the left adnexa worrisome for a tubal ovarian abscess.  Follow up examination after treatment is recommended to ensure resolution.  Above findings discussed with Idol, PA at 1241.  Original Report Authenticated By: Waynard Reeds, M.D.   US Pelvis Complete  07/21/2011  *RADIOLOGY REPORT*  Clinical Data: IUD removed 1 month ago, now with pelvic pain, evaluate for tubal ovarian abscess  TRANSABDOMINAL AND TRANSVAGINAL ULTRASOUND OF PELVIS Technique:  Both transabdominal and transvaginal ultrasound examinations of the pelvis were performed. Transabdominal technique was performed for global imaging of the pelvis including  uterus, ovaries, adnexal regions, and pelvic cul-de-sac.  Comparison: Pelvic ultrasound - 02/27/2007   It was necessary to proceed with endovaginal exam following the transabdominal exam to visualize the endometrium and bilateral ovaries.  Findings:  Uterus: The uterus is anteverted and normal in size, measuring 7.2 x 3.7 x 5.4 cm.  No discrete uterine mass.   Endometrium: Normal in size measuring 4.3 mm in diameter  Right ovary:  Normal in size measuring 2.4 x 1.5 x 2.2 cm.  No right-sided ovarian lesions.  Left ovary: Normal in size measuring 2.6 x 1.5 x 3.0 cm.  There is approximate 1.3 x 1.4 x 0.6 cm hypoechoic lesion within the left ovary, possibly a dominant follicle.  Other findings: There is an approximately 6.0 x 3.5 x 3.9 cm largely anechoic serpiginous/tubular lesion which demonstrates internal echoes which is favored to represent a dilated fallopian tube.  IMPRESSION:  Dilated approximately 6 cm apparent tubular structure within the left adnexa worrisome for a tubal ovarian abscess.  Follow up examination after treatment is recommended to ensure resolution.  Above findings discussed with Idol, PA at 1241.  Original Report Authenticated By: Waynard Reeds, M.D.      Medications: I have reviewed the patient's current medications. Prior to Admission:  (Not in a hospital admission)  Impression: Impression: Chronic right hydrosalpinx HIV  , hepatitis C Active Problems:  * No active hospital problems. *      Plan: Outpatient therapy with Cipro and Flagyl with followup in 5 days [facility] for confirmation of improvement or earlier when necessary fever nausea vomiting or worsening pain Percocet will be prescribed for pain patient has means to obtain prescriptions     LOS: 0 days   Shaida Route V 07/21/2011, 4:05 PM

## 2011-07-21 NOTE — ED Provider Notes (Signed)
History     CSN: [id number] Arrival date & time: 07/21/2011  8:44 AM   First MD Initiated Contact with Patient 07/21/11 [phone]      Chief Complaint  Patient presents with  . Vaginal Discharge  . Pelvic Pain    (Consider location/radiation/quality/duration/timing/severity/associated sxs/prior treatment) Patient is a 40 y.o. female presenting with vaginal discharge and pelvic pain. The history is provided by the patient.  Vaginal Discharge This is a new problem. The current episode started 1 to 4 weeks ago (Started 3 weeks ago,  along with pelvic pain,  1 week after having IUD removed). The problem occurs constantly. The problem has been unchanged. Associated symptoms include a fever. Pertinent negatives include no abdominal pain, arthralgias, change in bowel habit, chest pain, congestion, headaches, joint swelling, myalgias, nausea, neck pain, numbness, rash, sore throat, urinary symptoms, vomiting or weakness. The symptoms are aggravated by walking and standing (palpation). She has tried nothing for the symptoms.  Pelvic Pain Associated symptoms include a fever. Pertinent negatives include no abdominal pain, arthralgias, change in bowel habit, chest pain, congestion, headaches, joint swelling, myalgias, nausea, neck pain, numbness, rash, sore throat, urinary symptoms, vomiting or weakness.    Past Medical History  Diagnosis Date  . HIV disease   . Hep C w/o coma, chronic   . 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 PTSD (post-traumatic stress disorder)   . Depression     Past Surgical History  Procedure Date  . Neck surgery   . Arm surgery     right elbow  . Vagina surgery     vulvarian cyst removal    Family History  Problem Relation Age of Onset  . Diverticulitis Mother   . Alcohol abuse Father   . Hypertension Father   . Schizophrenia Father     History  Substance Use Topics  . Smoking status: Current Everyday Smoker --  0.3 packs/day for 25 years    Types: Cigarettes  . Smokeless tobacco: Never Used  . Alcohol Use: No    OB History    Grav Para Term Preterm Abortions TAB SAB Ect Mult Living                  Review of Systems  Constitutional: Positive for fever.  HENT: Negative for congestion, sore throat and neck pain.   Eyes: Negative.   Respiratory: Negative for chest tightness and shortness of breath.   Cardiovascular: Negative for chest pain.  Gastrointestinal: Negative for nausea, vomiting, abdominal pain and change in bowel habit.  Genitourinary: Positive for vaginal discharge and pelvic pain. Negative for vaginal bleeding.  Musculoskeletal: Negative for myalgias, joint swelling and arthralgias.  Skin: Negative.  Negative for rash and wound.  Neurological: Negative for dizziness, weakness, light-headedness, numbness and headaches.  Hematological: Negative.   Psychiatric/Behavioral: Negative.     Allergies  Food; Latex; and Penicillins  Home Medications   Current Outpatient Rx  Name Route Sig Dispense Refill  . ALPRAZOLAM 1 MG PO TABS Oral Take 1 mg by mouth 2 (two) times daily. Prescribed by mental health     . CYCLOBENZAPRINE HCL 5 MG PO TABS Oral Take 1 tablet (5 mg total) by mouth every 8 (eight) hours as needed for muscle spasms. 30 tablet 1  . DAPSONE 100 MG PO TABS Oral Take 100 mg by mouth daily.      Marland Kitchen EMTRICITABINE-TENOFOVIR 200-300 MG PO TABS Oral Take 1 tablet by  mouth daily. 30 tablet 11  . IBUPROFEN 200 MG PO TABS Oral Take 800 mg by mouth every 6 (six) hours as needed. pain    . PAROXETINE HCL 20 MG PO TABS Oral Take 20 mg by mouth every morning. Prescribed by mental health     . RALTEGRAVIR POTASSIUM 400 MG PO TABS Oral Take 1 tablet (400 mg total) by mouth 2 (two) times daily. 60 tablet 11  . TRAZODONE HCL 150 MG PO TB24 Oral Take 150 tablets by mouth 2 (two) times daily. Prescribed by mental health       BP 106/61  Pulse 75  Temp(Src) 97.8 F (36.6 C) (Oral)   Resp 16  Ht 5' 5.5" (1.664 m)  Wt 205 lb (92.987 kg)  BMI 33.59 kg/m2  SpO2 97%  Physical Exam  Nursing note and vitals reviewed. Constitutional: She is oriented to person, place, and time. She appears well-developed and well-nourished.  HENT:  Head: Normocephalic and atraumatic.  Eyes: Conjunctivae are normal.  Neck: Normal range of motion.  Cardiovascular: Normal rate, regular rhythm, normal heart sounds and intact distal pulses.   Pulmonary/Chest: Effort normal and breath sounds normal. She has no wheezes.  Abdominal: Soft. Bowel sounds are normal. There is no tenderness.  Genitourinary: Cervix exhibits discharge. Cervix exhibits no motion tenderness and no friability. Left adnexum displays tenderness. Left adnexum displays no fullness. No tenderness around the vagina. Vaginal discharge found.       Os closed  Musculoskeletal: Normal range of motion.  Neurological: She is alert and oriented to person, place, and time.  Skin: Skin is warm and dry.  Psychiatric: She has a normal mood and affect.    ED Course  Procedures (including critical care time)  Labs Reviewed  URINALYSIS, ROUTINE W REFLEX MICROSCOPIC - Abnormal; Notable for the following:    Specific Gravity, Urine >1.030 (*)    All other components within normal limits  CBC - Abnormal; Notable for the following:    WBC 3.5 (*)    RBC 3.62 (*)    MCV 102.2 (*)    MCH 35.6 (*)    All other components within normal limits  DIFFERENTIAL - Abnormal; Notable for the following:    Neutro Abs 1.6 (*)    All other components within normal limits  BASIC METABOLIC PANEL - Abnormal; Notable for the following:    Glucose, Bld 111 (*)    GFR calc non Af Amer 87 (*)    All other components within normal limits  WET PREP, GENITAL - Abnormal; Notable for the following:    Clue Cells, Wet Prep MANY (*)    WBC, Wet Prep HPF POC FEW (*)    All other components within normal limits  PREGNANCY, URINE  GC/CHLAMYDIA PROBE AMP,  GENITAL  RPR   US Transvaginal Non-ob  07/21/2011  *RADIOLOGY REPORT*  Clinical Data: IUD removed 1 month ago, now with pelvic pain, evaluate for tubal ovarian abscess  TRANSABDOMINAL AND TRANSVAGINAL ULTRASOUND OF PELVIS Technique:  Both transabdominal and transvaginal ultrasound examinations of the pelvis were performed. Transabdominal technique was performed for global imaging of the pelvis including uterus, ovaries, adnexal regions, and pelvic cul-de-sac.  Comparison: Pelvic ultrasound - 02/27/2007   It was necessary to proceed with endovaginal exam following the transabdominal exam to visualize the endometrium and bilateral ovaries.  Findings:  Uterus: The uterus is anteverted and normal in size, measuring 7.2 x 3.7 x 5.4 cm.  No discrete uterine mass.  Endometrium: Normal in  size measuring 4.3 mm in diameter  Right ovary:  Normal in size measuring 2.4 x 1.5 x 2.2 cm.  No right-sided ovarian lesions.  Left ovary: Normal in size measuring 2.6 x 1.5 x 3.0 cm.  There is approximate 1.3 x 1.4 x 0.6 cm hypoechoic lesion within the left ovary, possibly a dominant follicle.  Other findings: There is an approximately 6.0 x 3.5 x 3.9 cm largely anechoic serpiginous/tubular lesion which demonstrates internal echoes which is favored to represent a dilated fallopian tube.  IMPRESSION:  Dilated approximately 6 cm apparent tubular structure within the left adnexa worrisome for a tubal ovarian abscess.  Follow up examination after treatment is recommended to ensure resolution.  Above findings discussed with Zorah Backes, PA at 1241.  Original Report Authenticated By: Waynard Reeds, M.D.   US Pelvis Complete  07/21/2011  *RADIOLOGY REPORT*  Clinical Data: IUD removed 1 month ago, now with pelvic pain, evaluate for tubal ovarian abscess  TRANSABDOMINAL AND TRANSVAGINAL ULTRASOUND OF PELVIS Technique:  Both transabdominal and transvaginal ultrasound examinations of the pelvis were performed. Transabdominal technique was  performed for global imaging of the pelvis including uterus, ovaries, adnexal regions, and pelvic cul-de-sac.  Comparison: Pelvic ultrasound - 02/27/2007   It was necessary to proceed with endovaginal exam following the transabdominal exam to visualize the endometrium and bilateral ovaries.  Findings:  Uterus: The uterus is anteverted and normal in size, measuring 7.2 x 3.7 x 5.4 cm.  No discrete uterine mass.  Endometrium: Normal in size measuring 4.3 mm in diameter  Right ovary:  Normal in size measuring 2.4 x 1.5 x 2.2 cm.  No right-sided ovarian lesions.  Left ovary: Normal in size measuring 2.6 x 1.5 x 3.0 cm.  There is approximate 1.3 x 1.4 x 0.6 cm hypoechoic lesion within the left ovary, possibly a dominant follicle.  Other findings: There is an approximately 6.0 x 3.5 x 3.9 cm largely anechoic serpiginous/tubular lesion which demonstrates internal echoes which is favored to represent a dilated fallopian tube.  IMPRESSION:  Dilated approximately 6 cm apparent tubular structure within the left adnexa worrisome for a tubal ovarian abscess.  Follow up examination after treatment is recommended to ensure resolution.  Above findings discussed with Tacy Chavis, PA at 1241.  Original Report Authenticated By: Waynard Reeds, M.D.     No diagnosis found.    MDM  Call to Dr Emelda Fear - will see pt in ed,  May or may not admit.  Ovarian abscess vs hydrosalpinx.  Bacterial vaginosis.        Candis Musa, PA 07/21/11 1420  Dr. Emelda Fear in to evaluate patient.  Will treat as outpatient with abx and close outpatient f/u. Chronic hydrosalpinx,  Not ovarian abscess.  Candis Musa, PA 07/21/11 1637

## 2011-07-21 NOTE — Discharge Summary (Signed)
  Ms. Guilliams has a hydrosalpinx with her usual white count of 3500 no fever and is considered an outpatient candidate she will receive prescriptions for Cipro 750 twice a day and Flagyl 500 mg x2 weeks she will followup in 5 days [facility] for reassessment

## 2011-07-21 NOTE — ED Notes (Signed)
Pt c/o lower abd pain and vaginal discharge. Pt states she had her iud removed a month ago and has been having pain and yellow vaginal discharge since.

## 2011-07-21 NOTE — ED Notes (Signed)
Patient with no complaints at this time. Respirations even and unlabored. Skin warm/dry. Discharge instructions reviewed with patient at this time. Patient given opportunity to voice concerns/ask questions. IV removed per policy and band-aid applied to site. Patient discharged at this time and left Emergency Department with steady gait.  

## 2011-07-22 LAB — GC/CHLAMYDIA PROBE AMP, GENITAL
Chlamydia, DNA Probe: NEGATIVE
GC Probe Amp, Genital: NEGATIVE

## 2011-07-22 LAB — SYPHILIS: RPR W/REFLEX TO RPR TITER AND TREPONEMAL ANTIBODIES, TRADITIONAL SCREENING AND DIAGNOSIS ALGORITHM: RPR Ser Ql: NONREACTIVE

## 2011-07-22 NOTE — ED Provider Notes (Signed)
Medical screening examination/treatment/procedure(s) were performed by non-physician practitioner and as supervising physician I was immediately available for consultation/collaboration.  Nicoletta Dress. Colon Branch, MD 07/22/11 [phone]

## 2011-10-13 ENCOUNTER — Other Ambulatory Visit (INDEPENDENT_AMBULATORY_CARE_PROVIDER_SITE_OTHER): Payer: Self-pay

## 2011-10-13 ENCOUNTER — Ambulatory Visit: Payer: Self-pay

## 2011-10-13 DIAGNOSIS — Z79899 Other long term (current) drug therapy: Secondary | ICD-10-CM

## 2011-10-13 DIAGNOSIS — Z113 Encounter for screening for infections with a predominantly sexual mode of transmission: Secondary | ICD-10-CM

## 2011-10-13 DIAGNOSIS — B20 Human immunodeficiency virus [HIV] disease: Secondary | ICD-10-CM

## 2011-10-13 LAB — RPR: RPR Ser Ql: REACTIVE — AB

## 2011-10-13 LAB — COMPLETE METABOLIC PANEL WITH GFR
ALT: 31 U/L (ref 0–35)
AST: 43 U/L — ABNORMAL HIGH (ref 0–37)
Albumin: 3.9 g/dL (ref 3.5–5.2)
Alkaline Phosphatase: 66 U/L (ref 39–117)
BUN: 7 mg/dL (ref 6–23)
CO2: 25 mEq/L (ref 19–32)
Calcium: 8.6 mg/dL (ref 8.4–10.5)
Chloride: 104 mEq/L (ref 96–112)
Creat: 0.73 mg/dL (ref 0.50–1.10)
GFR, Est African American: >89 mL/min
GFR, Est Non African American: >89 mL/min
Glucose, Bld: 96 mg/dL (ref 70–99)
Potassium: 4 mEq/L (ref 3.5–5.3)
Sodium: 138 mEq/L (ref 135–145)
Total Bilirubin: 0.3 mg/dL (ref 0.3–1.2)
Total Protein: 6.9 g/dL (ref 6.0–8.3)

## 2011-10-13 LAB — CBC
HCT: 40.8 % (ref 36.0–46.0)
Hemoglobin: 13.9 g/dL (ref 12.0–15.0)
MCH: 35.3 pg — ABNORMAL HIGH (ref 26.0–34.0)
MCHC: 34.1 g/dL (ref 30.0–36.0)
MCV: 103.6 fL — ABNORMAL HIGH (ref 78.0–100.0)
Platelets: 251 10*3/uL (ref 150–400)
RBC: 3.94 MIL/uL (ref 3.87–5.11)
RDW: 13.2 % (ref 11.5–15.5)
WBC: 4.4 10*3/uL (ref 4.0–10.5)

## 2011-10-13 LAB — LIPID PANEL
Cholesterol: 170 mg/dL (ref 0–200)
HDL: 39 mg/dL — ABNORMAL LOW (ref >39)
LDL Cholesterol: 92 mg/dL (ref 0–99)
Total CHOL/HDL Ratio: 4.4 Ratio
Triglycerides: 194 mg/dL — ABNORMAL HIGH (ref <150)
VLDL: 39 mg/dL (ref 0–40)

## 2011-10-13 LAB — RPR TITER: RPR Titer: 1:1 {titer}

## 2011-10-14 LAB — T-HELPER CELL (CD4) - (RCID CLINIC ONLY)
CD4 % Helper T Cell: 6 % — ABNORMAL LOW (ref 33–55)
CD4 T Cell Abs: 80 uL — ABNORMAL LOW (ref 400–2700)

## 2011-10-14 LAB — T.PALLIDUM AB, TOTAL: T pallidum Antibodies (TP-PA): 0.81 S/CO (ref <0.90)

## 2011-10-18 ENCOUNTER — Encounter (HOSPITAL_COMMUNITY): Payer: Self-pay | Admitting: Pharmacy Technician

## 2011-10-18 LAB — HIV-1 RNA QUANT-NO REFLEX-BLD
HIV 1 RNA Quant: <20 copies/mL (ref <20)
HIV-1 RNA Quant, Log: <1.3 {Log} (ref <1.30)

## 2011-10-21 ENCOUNTER — Other Ambulatory Visit: Payer: Self-pay | Admitting: Obstetrics and Gynecology

## 2011-10-21 ENCOUNTER — Encounter (HOSPITAL_COMMUNITY): Admission: RE | Admit: 2011-10-21 | Discharge: 2011-10-21 | Payer: Self-pay | Source: Ambulatory Visit

## 2011-10-21 ENCOUNTER — Other Ambulatory Visit (HOSPITAL_COMMUNITY)
Admission: RE | Admit: 2011-10-21 | Discharge: 2011-10-21 | Disposition: A | Discharge status: Home / Self Care | Payer: Self-pay | Source: Ambulatory Visit | Attending: Obstetrics and Gynecology | Admitting: Obstetrics and Gynecology

## 2011-10-21 DIAGNOSIS — Z01419 Encounter for gynecological examination (general) (routine) without abnormal findings: Secondary | ICD-10-CM | POA: Insufficient documentation

## 2011-10-21 NOTE — Patient Instructions (Signed)
20 Nicole Silva  10/21/2011   Your procedure is scheduled on:  10/26/11  Report to [facility] at 07:25 AM.  Call this number if you have problems the morning of surgery: [phone]   Remember:   Do not eat food:After Midnight.  May have clear liquids:until Midnight .  Clear liquids include soda, tea, black coffee, apple or grape juice, broth.  Take these medicines the morning of surgery with A SIP OF WATER: Celexa, Truvada, Isentress, Flexeril, and Dapsone. Take your Xanax and Oxycodone only if needed.   Do not wear jewelry, make-up or nail polish.  Do not wear lotions, powders, or perfumes. You may wear deodorant.  Do not shave 48 hours prior to surgery.  Do not bring valuables to the hospital.  Contacts, dentures or bridgework may not be worn into surgery.  Leave suitcase in the car. After surgery it may be brought to your room.  For patients admitted to the hospital, checkout time is 11:00 AM the day of discharge.   Patients discharged the day of surgery will not be allowed to drive home.  Name and phone number of your driver:   Special Instructions: CHG Shower Use Special Wash: 1/2 bottle night before surgery and 1/2 bottle morning of surgery.   Please read over the following fact sheets that you were given: Pain Booklet, MRSA Information, Surgical Site Infection Prevention, Anesthesia Post-op Instructions and Care and Recovery After Surgery    Bilateral Salpingo-Oophorectomy Removal of both fallopian tubes and ovaries is called a Bilateral Salpingo-oophorectomy (BSO). The fallopian tubes transport the egg from the ovary to the womb (uterus). The fallopian tube is also where the sperm and egg meet and become fertilized and move down into the uterus. Usually when a BSO is done, the uterus was previously removed. Removing both tubes and ovaries will:  Put you into the menopause. You will no longer have menstrual periods.   May cause you to have symptoms of menopause (hot  flashes, night sweats, mood changes).   Not affect your sex drive or physical relationship.   Cause you to not be able to become pregnant (sterile).  LET YOUR CAREGIVER KNOW ABOUT:  Allergies to food or medication.   Medications taken including herbs, eye drops, over-the-counter medications, and creams.   Use of steroids (by mouth or creams).   Previous problems with anesthetics or numbing medication.   Possibility of pregnancy, if this applies.   Your smoking habits   History of blood clots (thrombophlebitis).   History of bleeding or blood problems.   Previous surgeries.   Other health problems.  RISKS AND COMPLICATIONS All surgery is associated with risks. Some of these risks are:  Injury to surrounding organs.   Bleeding.   Infection.   Blood clots in the legs or lungs.   Problems with the anesthesia.   The surgery does not help the problem.   Death.  BEFORE THE PROCEDURE  Do not take aspirin or blood thinners because it can make you bleed.   Do not eat or drink anything at least 8 hours before the surgery.   Let your caregiver know if you develop a cold or an infection.   If you are being admitted the day of surgery, arrive at least 1 hour before the surgery to read and sign the necessary forms and consents.   Arrange for help when you go home from the hospital.   If you smoke, do not smoke for at least 2 weeks before  the surgery.  PROCEDURE  You will change into a hospital gown. Then, you will be given an IV (intravenous) and a medication to relax you. You will be put to sleep with an anesthetic. Any hair on your lower belly (abdomen) will be removed, and a catheter will be placed in your bladder. The fallopian tubes and ovaries will be removed either through 2 very small cuts (incisions) or through large incision in the lower abdomen. AFTER THE PROCEDURE  You will be taken to the recovery room for 1 to 3 hours until your blood pressure, pulse, and  temperature are stable and you are waking up.   If you had a laparoscopy, you may be discharged in several hours.   If you had a laparoscopy, you may have shoulder pain for a day or two from air left in the abdomen. The air can irritate the nerve that goes from the diaphragm to the shoulder.   You will be given pain medication as is necessary.   The intravenous and catheter will be removed.   Have someone available to take you home from the hospital.  HOME CARE INSTRUCTIONS   Only take over-the-counter or prescription medicines for pain, discomfort, or fever as directed by your caregiver.   Do not take aspirin. It can cause bleeding.   Do not drive when taking pain medication.   Follow your caregiver's advice regarding diet, exercise, lifting, driving, and general activities.   You may resume your usual diet as directed and allowed.   Get plenty of rest and sleep.   Do not douche, use tampons, or have sexual intercourse until your caregiver says it is okay.   Change your bandages (dressings) as directed.   Take your temperature twice a day and write it down.   Your caregiver may recommend showers instead of baths for a few weeks.   Do not drink alcohol until your caregiver says it is okay.   If you develop constipation, you may take a mild laxative with your caregiver's permission. Bran foods and drinking fluids helps with constipation problems.   Try to have someone home with you for a week or two to help with the household activities.   Make sure you and your family understands everything about your operation and recovery.   Do not sign any legal documents until you feel normal again.   Keep all your follow-up appointments.  SEEK MEDICAL CARE IF:   There is swelling, redness, or increasing pain in the wound area.   Pus is coming from the wound.   You notice a bad smell from the wound or surgical dressing.   You have pain, redness, or swelling from the intravenous  site.   The wound is breaking open (the edges are not staying together).   You feel dizzy or feel like fainting.   You develop pain or bleeding when you urinate.   You develop diarrhea.   You develop nausea and vomiting.   You develop abnormal vaginal discharge.   You develop a rash.   You have any type of abnormal reaction or develop an allergy to your medication.   You need stronger pain medication for your pain.  SEEK IMMEDIATE MEDICAL CARE IF:   You develop an unexplained temperature above 100 F (37.8 C).   You develop abdominal pain.   You develop chest pain.   You develop shortness of breath.   You pass out.   You develop pain, swelling, or redness of your leg.  You develop heavy vaginal bleeding with or without blood clots.  Document Released: 08/30/2005 Document Revised: 05/12/2011 Document Reviewed: 01/24/2009 [facility] Patient Information 2012 [place], [place].   PATIENT INSTRUCTIONS POST-ANESTHESIA  IMMEDIATELY FOLLOWING SURGERY:  Do not drive or operate machinery for the first twenty four hours after surgery.  Do not make any important decisions for twenty four hours after surgery or while taking narcotic pain medications or sedatives.  If you develop intractable nausea and vomiting or a severe headache please notify your doctor immediately.  FOLLOW-UP:  Please make an appointment with your surgeon as instructed. You do not need to follow up with anesthesia unless specifically instructed to do so.  WOUND CARE INSTRUCTIONS (if applicable):  Keep a dry clean dressing on the anesthesia/puncture wound site if there is drainage.  Once the wound has quit draining you may leave it open to air.  Generally you should leave the bandage intact for twenty four hours unless there is drainage.  If the epidural site drains for more than 36-48 hours please call the anesthesia department.  QUESTIONS?:  Please feel free to call your physician or the hospital operator if you  have any questions, and they will be happy to assist you.     [facility] Anesthesia Department [address] [place] [phone]

## 2011-10-27 ENCOUNTER — Ambulatory Visit: Payer: Self-pay | Admitting: Infectious Diseases

## 2011-11-02 ENCOUNTER — Encounter (HOSPITAL_COMMUNITY): Payer: Self-pay

## 2011-11-11 ENCOUNTER — Encounter (HOSPITAL_COMMUNITY): Payer: Self-pay

## 2011-11-11 ENCOUNTER — Encounter (HOSPITAL_COMMUNITY)
Admission: RE | Admit: 2011-11-11 | Discharge: 2011-11-11 | Disposition: A | Discharge status: Home / Self Care | Payer: Self-pay | Source: Ambulatory Visit | Attending: Obstetrics and Gynecology | Admitting: Obstetrics and Gynecology

## 2011-11-11 ENCOUNTER — Other Ambulatory Visit: Payer: Self-pay | Admitting: Obstetrics and Gynecology

## 2011-11-11 HISTORY — DX: Other complications of anesthesia, initial encounter: T88.59XA

## 2011-11-11 HISTORY — DX: Nausea with vomiting, unspecified: R11.2

## 2011-11-11 HISTORY — DX: Shortness of breath: R06.02

## 2011-11-11 HISTORY — DX: Anxiety disorder, unspecified: F41.9

## 2011-11-11 HISTORY — DX: Other specified postprocedural states: Z98.890

## 2011-11-11 HISTORY — DX: Adverse effect of unspecified anesthetic, initial encounter: T41.45XA

## 2011-11-11 HISTORY — DX: Malignant (primary) neoplasm, unspecified: C80.1

## 2011-11-11 LAB — SURGICAL PCR SCREEN
MRSA, PCR: NEGATIVE
Staphylococcus aureus: NEGATIVE

## 2011-11-11 MED ORDER — PEG 3350-KCL-NABCB-NACL-NASULF 236 G PO SOLR: 2000.0000 mL | Freq: Once | ORAL | Status: DC

## 2011-11-11 NOTE — Pre-Procedure Instructions (Signed)
Pt had CBC and CMET done on 10/13/11, ok to use for pt's upcoming laparoscopic bilateral salpingectomy, possible salpingoophorectomy per Dr. Jayme Cloud.

## 2011-11-11 NOTE — Patient Instructions (Signed)
20 Nicole Silva  11/11/2011   Your procedure is scheduled on:  11/16/11  Report to [facility] at 06:15 AM.  Call this number if you have problems the morning of surgery: [phone]   Remember:   Do not eat food:After Midnight.  May have clear liquids:until Midnight .  Clear liquids include soda, tea, black coffee, apple or grape juice, broth.  Take these medicines the morning of surgery with A SIP OF WATER: Citalopram, Truvada, Isentress and Dapsone. Take your Alprazolam and Flexeril only if needed.   Do not wear jewelry, make-up or nail polish.  Do not wear lotions, powders, or perfumes. You may wear deodorant.  Do not shave 48 hours prior to surgery.  Do not bring valuables to the hospital.  Contacts, dentures or bridgework may not be worn into surgery.  Leave suitcase in the car. After surgery it may be brought to your room.  For patients admitted to the hospital, checkout time is 11:00 AM the day of discharge.   Patients discharged the day of surgery will not be allowed to drive home.  Name and phone number of your driver:   Special Instructions: CHG Shower Use Special Wash: 1/2 bottle night before surgery and 1/2 bottle morning of surgery.   Please read over the following fact sheets that you were given: Pain Booklet, MRSA Information, Surgical Site Infection Prevention, Anesthesia Post-op Instructions and Care and Recovery After Surgery    Bilateral Salpingo-Oophorectomy Removal of both fallopian tubes and ovaries is called a Bilateral Salpingo-oophorectomy (BSO). The fallopian tubes transport the egg from the ovary to the womb (uterus). The fallopian tube is also where the sperm and egg meet and become fertilized and move down into the uterus. Usually when a BSO is done, the uterus was previously removed. Removing both tubes and ovaries will:  Put you into the menopause. You will no longer have menstrual periods.   May cause you to have symptoms of menopause (hot flashes,  night sweats, mood changes).   Not affect your sex drive or physical relationship.   Cause you to not be able to become pregnant (sterile).  LET YOUR CAREGIVER KNOW ABOUT:  Allergies to food or medication.   Medications taken including herbs, eye drops, over-the-counter medications, and creams.   Use of steroids (by mouth or creams).   Previous problems with anesthetics or numbing medication.   Possibility of pregnancy, if this applies.   Your smoking habits   History of blood clots (thrombophlebitis).   History of bleeding or blood problems.   Previous surgeries.   Other health problems.  RISKS AND COMPLICATIONS All surgery is associated with risks. Some of these risks are:  Injury to surrounding organs.   Bleeding.   Infection.   Blood clots in the legs or lungs.   Problems with the anesthesia.   The surgery does not help the problem.   Death.  BEFORE THE PROCEDURE  Do not take aspirin or blood thinners because it can make you bleed.   Do not eat or drink anything at least 8 hours before the surgery.   Let your caregiver know if you develop a cold or an infection.   If you are being admitted the day of surgery, arrive at least 1 hour before the surgery to read and sign the necessary forms and consents.   Arrange for help when you go home from the hospital.   If you smoke, do not smoke for at least 2 weeks before the  surgery.  PROCEDURE  You will change into a hospital gown. Then, you will be given an IV (intravenous) and a medication to relax you. You will be put to sleep with an anesthetic. Any hair on your lower belly (abdomen) will be removed, and a catheter will be placed in your bladder. The fallopian tubes and ovaries will be removed either through 2 very small cuts (incisions) or through large incision in the lower abdomen. AFTER THE PROCEDURE  You will be taken to the recovery room for 1 to 3 hours until your blood pressure, pulse, and temperature  are stable and you are waking up.   If you had a laparoscopy, you may be discharged in several hours.   If you had a laparoscopy, you may have shoulder pain for a day or two from air left in the abdomen. The air can irritate the nerve that goes from the diaphragm to the shoulder.   You will be given pain medication as is necessary.   The intravenous and catheter will be removed.   Have someone available to take you home from the hospital.  HOME CARE INSTRUCTIONS   Only take over-the-counter or prescription medicines for pain, discomfort, or fever as directed by your caregiver.   Do not take aspirin. It can cause bleeding.   Do not drive when taking pain medication.   Follow your caregiver's advice regarding diet, exercise, lifting, driving, and general activities.   You may resume your usual diet as directed and allowed.   Get plenty of rest and sleep.   Do not douche, use tampons, or have sexual intercourse until your caregiver says it is okay.   Change your bandages (dressings) as directed.   Take your temperature twice a day and write it down.   Your caregiver may recommend showers instead of baths for a few weeks.   Do not drink alcohol until your caregiver says it is okay.   If you develop constipation, you may take a mild laxative with your caregiver's permission. Bran foods and drinking fluids helps with constipation problems.   Try to have someone home with you for a week or two to help with the household activities.   Make sure you and your family understands everything about your operation and recovery.   Do not sign any legal documents until you feel normal again.   Keep all your follow-up appointments.  SEEK MEDICAL CARE IF:   There is swelling, redness, or increasing pain in the wound area.   Pus is coming from the wound.   You notice a bad smell from the wound or surgical dressing.   You have pain, redness, or swelling from the intravenous site.    The wound is breaking open (the edges are not staying together).   You feel dizzy or feel like fainting.   You develop pain or bleeding when you urinate.   You develop diarrhea.   You develop nausea and vomiting.   You develop abnormal vaginal discharge.   You develop a rash.   You have any type of abnormal reaction or develop an allergy to your medication.   You need stronger pain medication for your pain.  SEEK IMMEDIATE MEDICAL CARE IF:   You develop an unexplained temperature above 100 F (37.8 C).   You develop abdominal pain.   You develop chest pain.   You develop shortness of breath.   You pass out.   You develop pain, swelling, or redness of your leg.  You develop heavy vaginal bleeding with or without blood clots.  Document Released: 08/30/2005 Document Revised: 05/12/2011 Document Reviewed: 01/24/2009 [facility] Patient Information 2012 [place], [place].   PATIENT INSTRUCTIONS POST-ANESTHESIA  IMMEDIATELY FOLLOWING SURGERY:  Do not drive or operate machinery for the first twenty four hours after surgery.  Do not make any important decisions for twenty four hours after surgery or while taking narcotic pain medications or sedatives.  If you develop intractable nausea and vomiting or a severe headache please notify your doctor immediately.  FOLLOW-UP:  Please make an appointment with your surgeon as instructed. You do not need to follow up with anesthesia unless specifically instructed to do so.  WOUND CARE INSTRUCTIONS (if applicable):  Keep a dry clean dressing on the anesthesia/puncture wound site if there is drainage.  Once the wound has quit draining you may leave it open to air.  Generally you should leave the bandage intact for twenty four hours unless there is drainage.  If the epidural site drains for more than 36-48 hours please call the anesthesia department.  QUESTIONS?:  Please feel free to call your physician or the hospital operator if you have  any questions, and they will be happy to assist you.     [facility] Anesthesia Department [address] [place] [phone]

## 2011-11-15 ENCOUNTER — Other Ambulatory Visit: Payer: Self-pay | Admitting: *Deleted

## 2011-11-15 DIAGNOSIS — B20 Human immunodeficiency virus [HIV] disease: Secondary | ICD-10-CM

## 2011-11-15 MED ORDER — EMTRICITABINE-TENOFOVIR DF 200-300 MG PO TABS: 1.0000 | ORAL_TABLET | Freq: Every day | ORAL | Status: DC

## 2011-11-15 MED ORDER — SULFAMETHOXAZOLE-TRIMETHOPRIM 800-160 MG PO TABS: ORAL_TABLET | ORAL | Status: DC

## 2011-11-15 MED ORDER — RALTEGRAVIR POTASSIUM 400 MG PO TABS: 400.0000 mg | ORAL_TABLET | Freq: Two times a day (BID) | ORAL | Status: DC

## 2011-11-16 ENCOUNTER — Encounter (HOSPITAL_COMMUNITY): Payer: Self-pay | Admitting: *Deleted

## 2011-11-16 ENCOUNTER — Ambulatory Visit (HOSPITAL_COMMUNITY): Payer: Self-pay | Admitting: Anesthesiology

## 2011-11-16 ENCOUNTER — Encounter (HOSPITAL_COMMUNITY): Payer: Self-pay | Admitting: Anesthesiology

## 2011-11-16 ENCOUNTER — Encounter (HOSPITAL_COMMUNITY)
Admission: RE | Disposition: A | Discharge status: Home / Self Care | Payer: Self-pay | Source: Ambulatory Visit | Attending: Obstetrics and Gynecology

## 2011-11-16 ENCOUNTER — Ambulatory Visit (HOSPITAL_COMMUNITY)
Admission: RE | Admit: 2011-11-16 | Discharge: 2011-11-16 | Disposition: A | Discharge status: Home / Self Care | Payer: Self-pay | Source: Ambulatory Visit | Attending: Obstetrics and Gynecology | Admitting: Obstetrics and Gynecology

## 2011-11-16 DIAGNOSIS — J4489 Other specified chronic obstructive pulmonary disease: Secondary | ICD-10-CM | POA: Insufficient documentation

## 2011-11-16 DIAGNOSIS — J449 Chronic obstructive pulmonary disease, unspecified: Secondary | ICD-10-CM | POA: Insufficient documentation

## 2011-11-16 DIAGNOSIS — Z8742 Personal history of other diseases of the female genital tract: Secondary | ICD-10-CM

## 2011-11-16 DIAGNOSIS — Z01812 Encounter for preprocedural laboratory examination: Secondary | ICD-10-CM | POA: Insufficient documentation

## 2011-11-16 DIAGNOSIS — B20 Human immunodeficiency virus [HIV] disease: Secondary | ICD-10-CM | POA: Insufficient documentation

## 2011-11-16 DIAGNOSIS — N7013 Chronic salpingitis and oophoritis: Secondary | ICD-10-CM | POA: Insufficient documentation

## 2011-11-16 LAB — URINALYSIS, ROUTINE W REFLEX MICROSCOPIC
Bilirubin Urine: NEGATIVE
Glucose, UA: NEGATIVE mg/dL
Hgb urine dipstick: NEGATIVE
Ketones, ur: NEGATIVE mg/dL
Leukocytes, UA: NEGATIVE
Nitrite: NEGATIVE
Protein, ur: NEGATIVE mg/dL
Specific Gravity, Urine: 1.025 (ref 1.005–1.030)
Urobilinogen, UA: 0.2 mg/dL (ref 0.0–1.0)
pH: 6 (ref 5.0–8.0)

## 2011-11-16 LAB — HCG, SERUM, QUALITATIVE: Preg, Serum: NEGATIVE

## 2011-11-16 SURGERY — SALPINGO-OOPHORECTOMY, LAPAROSCOPIC
Anesthesia: General | Laterality: Bilateral

## 2011-11-16 SURGERY — SALPINGECTOMY, BILATERAL, LAPAROSCOPIC
Anesthesia: General | Site: Abdomen | Wound class: Clean

## 2011-11-16 SURGERY — SALPINGO-OOPHORECTOMY, UNILATERAL, LAPAROSCOPIC
Anesthesia: General

## 2011-11-16 MED ORDER — CEFAZOLIN SODIUM-DEXTROSE 2-3 GM-% IV SOLR
INTRAVENOUS | Status: AC
  Filled 2011-11-16: qty 50

## 2011-11-16 MED ORDER — DEXAMETHASONE SODIUM PHOSPHATE 4 MG/ML IJ SOLN: 4.0000 mg | Freq: Once | INTRAMUSCULAR | Status: DC

## 2011-11-16 MED ORDER — ONDANSETRON HCL 4 MG/2ML IJ SOLN
4.0000 mg | Freq: Once | INTRAMUSCULAR | Status: AC
  Administered 2011-11-16: 4 mg via INTRAVENOUS

## 2011-11-16 MED ORDER — ROCURONIUM BROMIDE 100 MG/10ML IV SOLN
INTRAVENOUS | Status: DC | PRN
  Administered 2011-11-16: 40 mg via INTRAVENOUS

## 2011-11-16 MED ORDER — ONDANSETRON HCL 4 MG/2ML IJ SOLN
INTRAMUSCULAR | Status: AC
  Filled 2011-11-16: qty 2

## 2011-11-16 MED ORDER — 0.9 % SODIUM CHLORIDE (POUR BTL) OPTIME
TOPICAL | Status: DC | PRN
  Administered 2011-11-16: 1000 mL

## 2011-11-16 MED ORDER — MIDAZOLAM HCL 5 MG/5ML IJ SOLN
INTRAMUSCULAR | Status: DC | PRN
  Administered 2011-11-16: 2 mg via INTRAVENOUS

## 2011-11-16 MED ORDER — LACTATED RINGERS IV SOLN
INTRAVENOUS | Status: DC
  Administered 2011-11-16 (×2): via INTRAVENOUS

## 2011-11-16 MED ORDER — FENTANYL CITRATE 0.05 MG/ML IJ SOLN
INTRAMUSCULAR | Status: AC
  Filled 2011-11-16: qty 5

## 2011-11-16 MED ORDER — CEFAZOLIN SODIUM 1-5 GM-% IV SOLN
INTRAVENOUS | Status: DC | PRN
  Administered 2011-11-16: 2 g via INTRAVENOUS

## 2011-11-16 MED ORDER — FENTANYL CITRATE 0.05 MG/ML IJ SOLN
INTRAMUSCULAR | Status: DC | PRN
  Administered 2011-11-16 (×3): 100 ug via INTRAVENOUS
  Administered 2011-11-16: 50 ug via INTRAVENOUS
  Administered 2011-11-16: 100 ug via INTRAVENOUS
  Administered 2011-11-16 (×2): 25 ug via INTRAVENOUS

## 2011-11-16 MED ORDER — SUCCINYLCHOLINE CHLORIDE 20 MG/ML IJ SOLN
INTRAMUSCULAR | Status: AC
  Filled 2011-11-16: qty 1

## 2011-11-16 MED ORDER — ONDANSETRON HCL 4 MG/2ML IJ SOLN: 4.0000 mg | Freq: Once | INTRAMUSCULAR | Status: DC | PRN

## 2011-11-16 MED ORDER — GLYCOPYRROLATE 0.2 MG/ML IJ SOLN
INTRAMUSCULAR | Status: DC | PRN
  Administered 2011-11-16: 0.4 mg via INTRAVENOUS

## 2011-11-16 MED ORDER — GLYCOPYRROLATE 0.2 MG/ML IJ SOLN
0.2000 mg | Freq: Once | INTRAMUSCULAR | Status: AC
  Administered 2011-11-16: 0.2 mg via INTRAVENOUS

## 2011-11-16 MED ORDER — FENTANYL CITRATE 0.05 MG/ML IJ SOLN: 25.0000 ug | INTRAMUSCULAR | Status: DC | PRN

## 2011-11-16 MED ORDER — NEOSTIGMINE METHYLSULFATE 1 MG/ML IJ SOLN
INTRAMUSCULAR | Status: DC | PRN
  Administered 2011-11-16: 2 mg via INTRAVENOUS

## 2011-11-16 MED ORDER — ROCURONIUM BROMIDE 50 MG/5ML IV SOLN
INTRAVENOUS | Status: AC
  Filled 2011-11-16: qty 1

## 2011-11-16 MED ORDER — MIDAZOLAM HCL 2 MG/2ML IJ SOLN
INTRAMUSCULAR | Status: AC
  Filled 2011-11-16: qty 2

## 2011-11-16 MED ORDER — GLYCOPYRROLATE 0.2 MG/ML IJ SOLN
INTRAMUSCULAR | Status: AC
  Filled 2011-11-16: qty 1

## 2011-11-16 MED ORDER — SODIUM CHLORIDE 0.9 % IR SOLN
Status: DC | PRN
  Administered 2011-11-16: 3000 mL

## 2011-11-16 MED ORDER — MIDAZOLAM HCL 2 MG/2ML IJ SOLN
1.0000 mg | INTRAMUSCULAR | Status: DC | PRN
  Administered 2011-11-16: 2 mg via INTRAVENOUS

## 2011-11-16 MED ORDER — DEXAMETHASONE SODIUM PHOSPHATE 4 MG/ML IJ SOLN
INTRAMUSCULAR | Status: AC
  Filled 2011-11-16: qty 1

## 2011-11-16 MED ORDER — PROPOFOL 10 MG/ML IV BOLUS
INTRAVENOUS | Status: DC | PRN
  Administered 2011-11-16: 160 mg via INTRAVENOUS

## 2011-11-16 MED ORDER — PROPOFOL 10 MG/ML IV EMUL
INTRAVENOUS | Status: AC
  Filled 2011-11-16: qty 20

## 2011-11-16 MED ORDER — CEFAZOLIN SODIUM-DEXTROSE 2-3 GM-% IV SOLR: 2.0000 g | INTRAVENOUS | Status: DC

## 2011-11-16 MED ORDER — SCOPOLAMINE 1 MG/3DAYS TD PT72
MEDICATED_PATCH | TRANSDERMAL | Status: AC
  Filled 2011-11-16: qty 1

## 2011-11-16 MED ORDER — KETOROLAC TROMETHAMINE 30 MG/ML IJ SOLN: 30.0000 mg | Freq: Once | INTRAMUSCULAR | Status: DC

## 2011-11-16 MED ORDER — LIDOCAINE HCL 1 % IJ SOLN
INTRAMUSCULAR | Status: DC | PRN
  Administered 2011-11-16: 25 mg via INTRADERMAL

## 2011-11-16 MED ORDER — FLEET ENEMA 7-19 GM/118ML RE ENEM
1.0000 | ENEMA | Freq: Once | RECTAL | Status: DC
  Filled 2011-11-16: qty 1

## 2011-11-16 MED ORDER — SCOPOLAMINE 1 MG/3DAYS TD PT72
1.0000 | MEDICATED_PATCH | Freq: Once | TRANSDERMAL | Status: DC
  Administered 2011-11-16: 1.5 mg via TRANSDERMAL

## 2011-11-16 SURGICAL SUPPLY — 53 items
ADH SKN CLS APL DERMABOND .7 (GAUZE/BANDAGES/DRESSINGS) ×2
BAG HAMPER (MISCELLANEOUS) ×2 IMPLANT
BAG SPEC RTRVL LRG 6X4 10 (ENDOMECHANICALS) ×2
BLADE SURG SZ11 CARB STEEL (BLADE) ×2 IMPLANT
CLOTH BEACON ORANGE TIMEOUT ST (SAFETY) ×2 IMPLANT
COVER LIGHT HANDLE STERIS (MISCELLANEOUS) ×4 IMPLANT
DERMABOND ADVANCED (GAUZE/BANDAGES/DRESSINGS) ×1
DERMABOND ADVANCED .7 DNX12 (GAUZE/BANDAGES/DRESSINGS) ×1 IMPLANT
DRESSING COVERLET 3X1 FLEXIBLE (GAUZE/BANDAGES/DRESSINGS) ×6 IMPLANT
ELECT REM PT RETURN 9FT ADLT (ELECTROSURGICAL) ×3
ELECTRODE REM PT RTRN 9FT ADLT (ELECTROSURGICAL) ×1 IMPLANT
FILTER SMOKE EVAC LAPAROSHD (FILTER) ×2 IMPLANT
FORMALIN 10 PREFIL 120ML (MISCELLANEOUS) ×2 IMPLANT
GAUZE SPONGE 4X4 16PLY XRAY LF (GAUZE/BANDAGES/DRESSINGS) ×2 IMPLANT
GLOVE BIOGEL PI IND STRL 7.0 (GLOVE) ×1 IMPLANT
GLOVE BIOGEL PI INDICATOR 7.0 (GLOVE) ×1
GLOVE EXAM NITRILE MD LF STRL (GLOVE) ×2 IMPLANT
GLOVE INDICATOR STER SZ 9 (GLOVE) ×2 IMPLANT
GLOVE SKINSENSE 9.0 STRL ORNG (GLOVE) ×2 IMPLANT
GLOVE SS N UNI LF 6.5 STRL (GLOVE) ×2 IMPLANT
GOWN STRL REIN 3XL LVL4 (GOWN DISPOSABLE) ×2 IMPLANT
GOWN STRL REIN XL XLG (GOWN DISPOSABLE) ×2 IMPLANT
INST SET LAPROSCOPIC GYN AP (KITS) ×2 IMPLANT
IV NS IRRIG 3000ML ARTHROMATIC (IV SOLUTION) ×2 IMPLANT
KIT ROOM TURNOVER APOR (KITS) ×2 IMPLANT
KIT TROCAR LAP GYN (TROCAR) ×2 IMPLANT
MANIFOLD NEPTUNE II (INSTRUMENTS) ×2 IMPLANT
NDL HYPO 25X1 1.5 SAFETY (NEEDLE) IMPLANT
NDL INSUFFLATION 14GA 120MM (NEEDLE) IMPLANT
NEEDLE HYPO 25X1 1.5 SAFETY (NEEDLE) ×3 IMPLANT
NEEDLE INSUFFLATION 14GA 120MM (NEEDLE) ×3 IMPLANT
NS IRRIG 1000ML POUR BTL (IV SOLUTION) ×2 IMPLANT
PACK PERI GYN (CUSTOM PROCEDURE TRAY) ×2 IMPLANT
PAD ARMBOARD 7.5X6 YLW CONV (MISCELLANEOUS) ×2 IMPLANT
POUCH SPECIMEN RETRIEVAL 10MM (ENDOMECHANICALS) ×2 IMPLANT
SCALPEL HARMONIC ACE (MISCELLANEOUS) ×2 IMPLANT
SCISSORS LAP 5X35 DISP (ENDOMECHANICALS) ×2 IMPLANT
SET BASIN LINEN APH (SET/KITS/TRAYS/PACK) ×2 IMPLANT
SET TUBE IRRIG SUCTION NO TIP (IRRIGATION / IRRIGATOR) ×2 IMPLANT
SOL PREP PROV IODINE SCRUB 4OZ (MISCELLANEOUS) ×2 IMPLANT
SOLUTION ANTI FOG 6CC (MISCELLANEOUS) ×2 IMPLANT
STRIP CLOSURE SKIN 1/4X3 (GAUZE/BANDAGES/DRESSINGS) ×2 IMPLANT
SUT VIC AB 4-0 PS2 27 (SUTURE) IMPLANT
SUT VICRYL 0 UR6 27IN ABS (SUTURE) ×2 IMPLANT
SUT VICRYL AB 3-0 FS1 BRD 27IN (SUTURE) ×2 IMPLANT
SYR BULB IRRIGATION 50ML (SYRINGE) ×2 IMPLANT
SYR CONTROL 10ML LL (SYRINGE) ×2 IMPLANT
SYRINGE 10CC LL (SYRINGE) ×2 IMPLANT
TOWEL OR 17X26 4PK STRL BLUE (TOWEL DISPOSABLE) ×2 IMPLANT
TRAY FOLEY BAG SILVER LF 14FR (CATHETERS) ×2 IMPLANT
TUBING INSUFFLATION 10FT LAP (TUBING) ×2 IMPLANT
TUBING INSUFFLATION HIGH FLOW (TUBING) ×2 IMPLANT
WARMER LAPAROSCOPE (MISCELLANEOUS) ×2 IMPLANT

## 2011-11-16 SURGICAL SUPPLY — 49 items
ADH SKN CLS APL DERMABOND .7 (GAUZE/BANDAGES/DRESSINGS)
BAG HAMPER (MISCELLANEOUS) ×1 IMPLANT
BAG SPEC RTRVL LRG 6X4 10 (ENDOMECHANICALS)
BLADE SURG SZ11 CARB STEEL (BLADE) ×1 IMPLANT
CLOTH BEACON ORANGE TIMEOUT ST (SAFETY) ×1 IMPLANT
COVER LIGHT HANDLE STERIS (MISCELLANEOUS) ×1 IMPLANT
DERMABOND ADVANCED (GAUZE/BANDAGES/DRESSINGS)
DERMABOND ADVANCED .7 DNX12 (GAUZE/BANDAGES/DRESSINGS) ×1 IMPLANT
DRESSING COVERLET 3X1 FLEXIBLE (GAUZE/BANDAGES/DRESSINGS) ×1 IMPLANT
ELECT REM PT RETURN 9FT ADLT (ELECTROSURGICAL)
ELECTRODE REM PT RTRN 9FT ADLT (ELECTROSURGICAL) ×1 IMPLANT
FILTER SMOKE EVAC LAPAROSHD (FILTER) ×1 IMPLANT
FORMALIN 10 PREFIL 480ML (MISCELLANEOUS) ×1 IMPLANT
GAUZE SPONGE 4X4 16PLY XRAY LF (GAUZE/BANDAGES/DRESSINGS) ×1 IMPLANT
GLOVE ECLIPSE 9.0 STRL (GLOVE) ×1 IMPLANT
GLOVE INDICATOR STER SZ 9 (GLOVE) ×1 IMPLANT
GOWN STRL REIN 3XL LVL4 (GOWN DISPOSABLE) ×1 IMPLANT
GOWN STRL REIN XL XLG (GOWN DISPOSABLE) ×1 IMPLANT
INST SET LAPROSCOPIC GYN AP (KITS) ×1 IMPLANT
IV NS IRRIG 3000ML ARTHROMATIC (IV SOLUTION) ×1 IMPLANT
KIT ROOM TURNOVER APOR (KITS) ×1 IMPLANT
KIT TROCAR LAP GYN (TROCAR) ×1 IMPLANT
MANIFOLD NEPTUNE II (INSTRUMENTS) ×1 IMPLANT
NDL HYPO 25X1 1.5 SAFETY (NEEDLE) ×1 IMPLANT
NDL INSUFFLATION 14GA 120MM (NEEDLE) ×1 IMPLANT
NEEDLE HYPO 25X1 1.5 SAFETY (NEEDLE) IMPLANT
NEEDLE INSUFFLATION 14GA 120MM (NEEDLE) IMPLANT
NS IRRIG 1000ML POUR BTL (IV SOLUTION) ×1 IMPLANT
PACK PERI GYN (CUSTOM PROCEDURE TRAY) ×1 IMPLANT
PAD ARMBOARD 7.5X6 YLW CONV (MISCELLANEOUS) ×1 IMPLANT
POUCH SPECIMEN RETRIEVAL 10MM (ENDOMECHANICALS) ×1 IMPLANT
SCALPEL HARMONIC ACE (MISCELLANEOUS) ×1 IMPLANT
SCISSORS LAP 5X35 DISP (ENDOMECHANICALS) ×1 IMPLANT
SET BASIN LINEN APH (SET/KITS/TRAYS/PACK) ×1 IMPLANT
SET TUBE IRRIG SUCTION NO TIP (IRRIGATION / IRRIGATOR) ×1 IMPLANT
SOL PREP PROV IODINE SCRUB 4OZ (MISCELLANEOUS) ×1 IMPLANT
SOLUTION ANTI FOG 6CC (MISCELLANEOUS) ×1 IMPLANT
STRIP CLOSURE SKIN 1/4X3 (GAUZE/BANDAGES/DRESSINGS) ×1 IMPLANT
SUT VIC AB 4-0 PS2 27 (SUTURE) ×1 IMPLANT
SUT VICRYL 0 UR6 27IN ABS (SUTURE) ×1 IMPLANT
SUT VICRYL AB 3-0 FS1 BRD 27IN (SUTURE) ×1 IMPLANT
SYR BULB IRRIGATION 50ML (SYRINGE) ×1 IMPLANT
SYR CONTROL 10ML LL (SYRINGE) ×1 IMPLANT
TOWEL OR 17X26 4PK STRL BLUE (TOWEL DISPOSABLE) ×1 IMPLANT
TRAY FOLEY CATH 16FR SILVER (SET/KITS/TRAYS/PACK) ×2 IMPLANT
TUBING DYE INJECTION 900-617 (MISCELLANEOUS) ×1 IMPLANT
TUBING INSUFFLATION 10FT LAP (TUBING) ×1 IMPLANT
TUBING INSUFFLATION HIGH FLOW (TUBING) ×2 IMPLANT
WARMER LAPAROSCOPE (MISCELLANEOUS) ×1 IMPLANT

## 2011-11-16 NOTE — OR Nursing (Signed)
This case was cancelled out of the epic before the decision to reschedule at 1100.

## 2011-11-16 NOTE — Anesthesia Procedure Notes (Signed)
Procedure Name: Intubation Date/Time: 11/16/2011 11:01 AM Performed by: Despina Hidden Pre-anesthesia Checklist: Emergency Drugs available, Suction available, Patient identified and Patient being monitored Patient Re-evaluated:Patient Re-evaluated prior to inductionOxygen Delivery Method: Circle system utilized Preoxygenation: Pre-oxygenation with 100% oxygen Intubation Type: IV induction and Cricoid Pressure applied Ventilation: Mask ventilation without difficulty Laryngoscope Size: 3 and Mac Grade View: Grade I Tube type: Oral Tube size: 7.0 mm Number of attempts: 1 Airway Equipment and Method: Stylet Placement Confirmation: ETT inserted through vocal cords under direct vision,  positive ETCO2 and breath sounds checked- equal and bilateral Secured at: 22 cm Tube secured with: Tape Dental Injury: Teeth and Oropharynx as per pre-operative assessment

## 2011-11-16 NOTE — Progress Notes (Signed)
Pt told Dr. Emelda Fear that she had a cup of coffee with cream this morning @ 0430. During preop checklist eval pt stated that she only had a sip of water with pills. Pt postponed until 1100.

## 2011-11-16 NOTE — Op Note (Signed)
See dictated operative note details in brief op note.

## 2011-11-16 NOTE — Brief Op Note (Signed)
11/16/2011  12:21 PM  PATIENT:  Nicole Silva  41 y.o. female  PRE-OPERATIVE DIAGNOSIS:  bilateral hydrosalpinx  POST-OPERATIVE DIAGNOSIS:  bilateral hydrosalpinx  PROCEDURE:  Procedure(s) (LRB): LAPAROSCOPIC  BILATERAL SALPINGECTOMY (Bilateral)  SURGEON:  Surgeon(s) and Role:    * Tilda Burrow, MD - Primary  PHYSICIAN ASSISTANT:   ASSISTANTS: Kendrick, CST   ANESTHESIA:   general  EBL:  Total I/O In: 1000 [I.V.:1000] Out: 1150 [Urine:1100; Blood:50]  BLOOD ADMINISTERED:none  DRAINS: none   LOCAL MEDICATIONS USED:  NONE  SPECIMEN:  Source of Specimen:  bilateral tubes  DISPOSITION OF SPECIMEN:  PATHOLOGY  COUNTS:  YES  TOURNIQUET:  * No tourniquets in log *  DICTATION: .Dragon Dictation Patient was taken to the operating room prepped and draped for complete plan abdominal and vaginal procedure timeout was conducted and IV antibiotics administered and patient Nicole Silva a tenaculum attached the cervix. An infraumbilical vertical 1 cm skin incision was made as well as suprapubic and right lower quadrant incisions. A was used to achieve pneumoperitoneum under 13 mm pressure the, and laparoscope introduced revealing no evidence of trauma associated with insertion. Suprapubic and right lower quadrant trochars were inserted under direct visualization and attention directed to the pelvis where the uterus was found to be densely adherent around both tubes and ovaries with an epiploic fat attachments all away across to the right tube and ovary. And then with harmonic scalpel. Salpingectomy was then performed beginning at the tubal on U. freeing up all the adhesions, then taking the tube down by harmonic scalpel coagulation across the mesosalpinx. Tubes were inserted then extracted through the umbilicus a 5 mm camera used suprapubic area to direct this procedure. Nose was inspected and ovaries confirmed as normal in appearance with adhesions freed laparoscopic instruments were  removed. Fascial closure at the umbilicus was closed with 0 Vicryl under direct visualization. Subcuticular closure with 3-0 Vicryl completed the procedure and Steri-Strips were applied sponge and needle counts were correct patient went to recovery in stable condition   PLAN OF CARE: Discharge to home after PACU  PATIENT DISPOSITION:  PACU - hemodynamically stable.   Delay start of Pharmacological VTE agent (>24hrs) due to surgical blood loss or risk of bleeding: not applicable

## 2011-11-16 NOTE — Addendum Note (Signed)
Addendum  created 11/16/11 1232 by Taelynn Mcelhannon J Alfa Leibensperger, CRNA   Modules edited:Anesthesia Medication Administration    

## 2011-11-16 NOTE — Anesthesia Postprocedure Evaluation (Signed)
  Anesthesia Post-op Note  Patient: Nicole Silva  Procedure(s) Performed: Procedure(s) (LRB): LAPAROSCOPIC SALPINGO OOPHERECTOMY (Bilateral) BILATERAL SALPINGECTOMY (Bilateral)  Patient Location: PACU  Anesthesia Type: General  Level of Consciousness: awake, alert , oriented and patient cooperative  Airway and Oxygen Therapy: Patient Spontanous Breathing and Patient connected to face mask oxygen  Post-op Pain: 3 /10, mild  Post-op Assessment: Post-op Vital signs reviewed, Patient's Cardiovascular Status Stable, Respiratory Function Stable, Patent Airway, No signs of Nausea or vomiting and Pain level controlled  Post-op Vital Signs: Reviewed and stable  Complications: No apparent anesthesia complications

## 2011-11-16 NOTE — Anesthesia Preprocedure Evaluation (Addendum)
Anesthesia Evaluation  Patient identified by MRN, date of birth, ID band Patient awake    Reviewed: Allergy & Precautions, H&P , NPO status , Patient's Chart, lab work & pertinent test results  History of Anesthesia Complications (+) PONV  Airway Mallampati: II      Dental  (+) Missing and Poor Dentition   Pulmonary shortness of breath, asthma , COPDCurrent Smoker,  breath sounds clear to auscultation        Cardiovascular negative cardio ROS  Rhythm:Regular Rate:Normal     Neuro/Psych PSYCHIATRIC DISORDERS (hx PTSD) Anxiety Depression    GI/Hepatic (+) Hepatitis -, C  Endo/Other    Renal/GU      Musculoskeletal   Abdominal   Peds  Hematology   Anesthesia Other Findings   Reproductive/Obstetrics                           Anesthesia Physical Anesthesia Plan  ASA: III  Anesthesia Plan: General   Post-op Pain Management:    Induction: Intravenous  Airway Management Planned: Oral ETT  Additional Equipment:   Intra-op Plan:   Post-operative Plan: Extubation in OR  Informed Consent: I have reviewed the patients History and Physical, chart, labs and discussed the procedure including the risks, benefits and alternatives for the proposed anesthesia with the patient or authorized representative who has indicated his/her understanding and acceptance.     Plan Discussed with:   Anesthesia Plan Comments:         Anesthesia Quick Evaluation

## 2011-11-16 NOTE — Addendum Note (Signed)
Addendum  created 11/16/11 1232 by Despina Hidden, CRNA   Modules edited:Anesthesia Medication Administration

## 2011-11-16 NOTE — Transfer of Care (Signed)
Immediate Anesthesia Transfer of Care Note  Patient: Nicole Silva  Procedure(s) Performed: Procedure(s) (LRB): LAPAROSCOPIC SALPINGO OOPHERECTOMY (Bilateral) BILATERAL SALPINGECTOMY (Bilateral)  Patient Location: PACU  Anesthesia Type: General  Level of Consciousness: awake, alert  and patient cooperative  Airway & Oxygen Therapy: Patient Spontanous Breathing and Patient connected to face mask oxygen  Post-op Assessment: Report given to PACU RN, Post -op Vital signs reviewed and stable and Patient moving all extremities  Post vital signs: Reviewed and stable  Complications: No apparent anesthesia complications
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
## 2011-12-02 NOTE — Addendum Note (Signed)
Addendum  created 12/02/11 1624 by Franco Nones, CRNA   Modules edited:Charges VN

## 2011-12-08 ENCOUNTER — Ambulatory Visit: Payer: Self-pay | Admitting: Infectious Diseases

## 2011-12-13 ENCOUNTER — Other Ambulatory Visit: Payer: Self-pay | Admitting: Infectious Diseases

## 2011-12-13 ENCOUNTER — Other Ambulatory Visit: Payer: Self-pay | Admitting: *Deleted

## 2011-12-13 DIAGNOSIS — B20 Human immunodeficiency virus [HIV] disease: Secondary | ICD-10-CM

## 2011-12-16 ENCOUNTER — Telehealth: Payer: Self-pay | Admitting: *Deleted

## 2011-12-16 NOTE — Telephone Encounter (Signed)
I told her she does not have one in computer. She had missed the one on the 27th.  C/o genital herpes & wanted mediccine. Not seen in current or past meds. Asked her to call her pcp to see if he will send a rx in. She will.  I then transferred her to the front to make another appt

## 2011-12-20 ENCOUNTER — Ambulatory Visit (INDEPENDENT_AMBULATORY_CARE_PROVIDER_SITE_OTHER): Payer: Self-pay | Admitting: Internal Medicine

## 2011-12-20 ENCOUNTER — Encounter: Payer: Self-pay | Admitting: Internal Medicine

## 2011-12-20 VITALS — BP 125/79 | HR 70 | Temp 97.9°F | Wt 238.0 lb

## 2011-12-20 DIAGNOSIS — A6000 Herpesviral infection of urogenital system, unspecified: Secondary | ICD-10-CM | POA: Insufficient documentation

## 2011-12-20 DIAGNOSIS — M79605 Pain in left leg: Secondary | ICD-10-CM | POA: Insufficient documentation

## 2011-12-20 DIAGNOSIS — O98519 Other viral diseases complicating pregnancy, unspecified trimester: Secondary | ICD-10-CM

## 2011-12-20 DIAGNOSIS — Z23 Encounter for immunization: Secondary | ICD-10-CM

## 2011-12-20 DIAGNOSIS — M79609 Pain in unspecified limb: Secondary | ICD-10-CM

## 2011-12-20 DIAGNOSIS — B20 Human immunodeficiency virus [HIV] disease: Secondary | ICD-10-CM

## 2011-12-20 MED ORDER — OXYCODONE-ACETAMINOPHEN 7.5-325 MG PO TABS: 1.0000 | ORAL_TABLET | Freq: Three times a day (TID) | ORAL | Status: DC | PRN

## 2011-12-20 MED ORDER — ACYCLOVIR 400 MG PO TABS: 400.0000 mg | ORAL_TABLET | Freq: Three times a day (TID) | ORAL | Status: DC

## 2011-12-20 NOTE — Progress Notes (Signed)
Patient ID: Nicole Silva, female   DOB: 1971/06/02, 41 y.o.   MRN: [id number]  INFECTIOUS DISEASE PROGRESS NOTE    Subjective:  Nicole Silva is seen on a work in basis. She recently had surgery on her fallopian tubes related to chronic infection that was felt to be caused by an IUD. She states that she recovered well from that but had problems with vaginal yeast infection and was treated but just recently developed painful vulvar blisters. Dr. Emelda Fear diagnosed genital herpes and she was started on acyclovir 400 mg twice daily 3 days ago. She states she's never had an outbreak that she knows of. Her current female partner denies having ever had herpes. She states that she is feeling a little bit better with less pain.   She's also recently developed left calf swelling and pain and noted a bruise on her left upper, outer calf. She has not had any injury to that leg. She has no history of blood clots.  She denies missing any doses of her Truvada or Isentress.  Objective: Temp: 97.9 F (36.6 C) (04/08 1409) Temp src: Oral (04/08 1409) BP: 125/79 mmHg (04/08 1409) Pulse Rate: 70  (04/08 1409)  General:  She appears somewhat uncomfortable Skin:  She does have a bruise on the left upper, outer calf in the area of some small varicosities Lungs:  clear Cor:  Regular S1 and S2 and no murmurs Abdomen:  Soft and nontender GU:  Not examined at her request She does have some 1+ edema of her left foot and calf associated with some tenderness with palpation  Lab Results HIV 1 RNA Quant (copies/mL)  Date Value  10/13/2011 <20   06/18/2011 48*  04/26/2011 48400*     CD4 T Cell Abs (cmm)  Date Value  10/13/2011 80*  06/18/2011 90*  04/26/2011 100*     Assessment: It does sound like she may have a first episode of genital herpes. She seems to be improving with empiric acyclovir year but I will need to increase the dose to 3 times daily.   I will continue her current antiretroviral regimen and  repeat lab work today. She has been doing some reading and is interested in finding out from Dr. Ninetta Lights if she has a candidate for a single pill regimen.   I will order a Doppler ultrasound to look for evidence of left leg DVT. 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 She states that this does not mean she cannot be treated for medical reasons and because of her pain I have agreed to give her a very limited supply of Percocet.  Plan: 1.  continue Truvada and Isentress 2.  Increase acyclovir to 400 mg 3 times a day for 10 days 3.  Left leg Doppler ultrasound 4.  Agreed to prescribe a limited supply of Percocet   Cliffton Asters, MD [facility] [phone] pager   [phone] cell 12/20/2011, 5:03 PM

## 2011-12-21 LAB — COMPREHENSIVE METABOLIC PANEL
ALT: 29 U/L (ref 0–35)
AST: 40 U/L — ABNORMAL HIGH (ref 0–37)
Albumin: 4.3 g/dL (ref 3.5–5.2)
Alkaline Phosphatase: 271 U/L — ABNORMAL HIGH (ref 39–117)
BUN: 10 mg/dL (ref 6–23)
CO2: 25 mEq/L (ref 19–32)
Calcium: 9 mg/dL (ref 8.4–10.5)
Chloride: 104 mEq/L (ref 96–112)
Creat: 0.77 mg/dL (ref 0.50–1.10)
Glucose, Bld: 72 mg/dL (ref 70–99)
Potassium: 4.3 mEq/L (ref 3.5–5.3)
Sodium: 137 mEq/L (ref 135–145)
Total Bilirubin: 0.4 mg/dL (ref 0.3–1.2)
Total Protein: 7.3 g/dL (ref 6.0–8.3)

## 2011-12-21 LAB — CBC
HCT: 42.8 % (ref 36.0–46.0)
Hemoglobin: 14.5 g/dL (ref 12.0–15.0)
MCH: 35.7 pg — ABNORMAL HIGH (ref 26.0–34.0)
MCHC: 33.9 g/dL (ref 30.0–36.0)
MCV: 105.4 fL — ABNORMAL HIGH (ref 78.0–100.0)
Platelets: 313 10*3/uL (ref 150–400)
RBC: 4.06 MIL/uL (ref 3.87–5.11)
RDW: 13.5 % (ref 11.5–15.5)
WBC: 5.2 10*3/uL (ref 4.0–10.5)

## 2011-12-21 LAB — T-HELPER CELL (CD4) - (RCID CLINIC ONLY)
CD4 % Helper T Cell: 8 % — ABNORMAL LOW (ref 33–55)
CD4 T Cell Abs: 150 uL — ABNORMAL LOW (ref 400–2700)

## 2011-12-22 LAB — HIV-1 RNA QUANT-NO REFLEX-BLD
HIV 1 RNA Quant: 34 copies/mL — ABNORMAL HIGH (ref <20)
HIV-1 RNA Quant, Log: 1.53 {Log} — ABNORMAL HIGH (ref <1.30)

## 2011-12-27 ENCOUNTER — Telehealth: Payer: Self-pay | Admitting: *Deleted

## 2011-12-27 ENCOUNTER — Other Ambulatory Visit: Payer: Self-pay | Admitting: Internal Medicine

## 2011-12-27 DIAGNOSIS — O2230 Deep phlebothrombosis in pregnancy, unspecified trimester: Secondary | ICD-10-CM

## 2011-12-27 NOTE — Telephone Encounter (Signed)
Called [facility] and set the patient appt for Wednesday 17 at 200 pm. Called patient and she was not able to keep that one and she wanted the number to call and reschedule her own appt gave her [phone].

## 2011-12-29 ENCOUNTER — Other Ambulatory Visit (HOSPITAL_COMMUNITY): Payer: Self-pay

## 2011-12-31 ENCOUNTER — Other Ambulatory Visit: Payer: Self-pay | Admitting: Licensed Clinical Social Worker

## 2011-12-31 DIAGNOSIS — M79606 Pain in leg, unspecified: Secondary | ICD-10-CM

## 2012-01-03 ENCOUNTER — Other Ambulatory Visit (HOSPITAL_COMMUNITY): Payer: Self-pay

## 2012-01-05 ENCOUNTER — Other Ambulatory Visit: Payer: Self-pay | Admitting: *Deleted

## 2012-01-05 DIAGNOSIS — B20 Human immunodeficiency virus [HIV] disease: Secondary | ICD-10-CM

## 2012-01-05 MED ORDER — EMTRICITABINE-TENOFOVIR DF 200-300 MG PO TABS: 1.0000 | ORAL_TABLET | Freq: Every day | ORAL | Status: DC

## 2012-01-05 MED ORDER — RALTEGRAVIR POTASSIUM 400 MG PO TABS: 400.0000 mg | ORAL_TABLET | Freq: Two times a day (BID) | ORAL | Status: DC

## 2012-01-05 NOTE — Telephone Encounter (Signed)
Pharmacist called to get a verbal order for the patient to have refills of the Truvada and Isentress. As the patient is out.

## 2012-02-09 ENCOUNTER — Emergency Department (HOSPITAL_COMMUNITY)
Admission: EM | Admit: 2012-02-09 | Discharge: 2012-02-09 | Disposition: A | Discharge status: Home / Self Care | Payer: Self-pay | Attending: Emergency Medicine | Admitting: Emergency Medicine

## 2012-02-09 ENCOUNTER — Encounter (HOSPITAL_COMMUNITY): Payer: Self-pay | Admitting: *Deleted

## 2012-02-09 DIAGNOSIS — Z21 Asymptomatic human immunodeficiency virus [HIV] infection status: Secondary | ICD-10-CM | POA: Insufficient documentation

## 2012-02-09 DIAGNOSIS — J45909 Unspecified asthma, uncomplicated: Secondary | ICD-10-CM | POA: Insufficient documentation

## 2012-02-09 DIAGNOSIS — Z8619 Personal history of other infectious and parasitic diseases: Secondary | ICD-10-CM | POA: Insufficient documentation

## 2012-02-09 DIAGNOSIS — F3289 Other specified depressive episodes: Secondary | ICD-10-CM | POA: Insufficient documentation

## 2012-02-09 DIAGNOSIS — F32A Depression, unspecified: Secondary | ICD-10-CM

## 2012-02-09 DIAGNOSIS — F329 Major depressive disorder, single episode, unspecified: Secondary | ICD-10-CM | POA: Insufficient documentation

## 2012-02-09 DIAGNOSIS — Z79899 Other long term (current) drug therapy: Secondary | ICD-10-CM | POA: Insufficient documentation

## 2012-02-09 NOTE — ED Notes (Signed)
States that she missed a few doses of meds and case manager wants pt. Evaluated, pt denies SI or HI, denies auditory or visual hallucinations

## 2012-02-09 NOTE — Discharge Instructions (Signed)
Depression You have signs of depression. This is a common problem. It can occur at any age. It is often hard to recognize. People can suffer from depression and still have moments of enjoyment. Depression interferes with your basic ability to function in life. It upsets your relationships, sleep, eating, and work habits. CAUSES  Depression is believed to be caused by an imbalance in brain chemicals. It may be triggered by an unpleasant event. Relationship crises, a death in the family, financial worries, retirement, or other stressors are normal causes of depression. Depression may also start for no known reason. Other factors that may play a part include medical illnesses, some medicines, genetics, and alcohol or drug abuse. SYMPTOMS   Feeling unhappy or worthless.   Long-lasting (chronic) tiredness or worn-out feeling.   Self-destructive thoughts and actions.   Not being able to sleep or sleeping too much.   Eating more than usual or not eating at all.   Headaches or feeling anxious.   Trouble concentrating or making decisions.   Unexplained physical problems and substance abuse.  TREATMENT  Depression usually gets better with treatment. This can include:  Antidepressant medicines. It can take weeks before the proper dose is achieved and benefits are reached.   Talking with a therapist, clergyperson, counselor, or friend. These people can help you gain insight into your problem and regain control of your life.   Eating a good diet.   Getting regular physical exercise, such as walking for 30 minutes every day.   Not abusing alcohol or drugs.  Treating depression often takes 6 months or longer. This length of treatment is needed to keep symptoms from returning. Call your caregiver and arrange for follow-up care as suggested. SEEK IMMEDIATE MEDICAL CARE IF:   You start to have thoughts of hurting yourself or others.   Call your local emergency services (911 in [place]   Go to  your local medical emergency department.   Call the National Suicide Prevention Lifeline: 1-800-273-TALK (316)675-0480).  Document Released: 08/30/2005 Document Revised: 08/19/2011 Document Reviewed: 01/30/2010 [facility] Patient Information 2012 [place], [place].  followup with day Loraine Leriche continue take her medicines as prescribed return for any worsening of your depression or suicidal thoughts.  RESOURCE GUIDE  Dental Problems  Patients with Medicaid: [facility] [phone] [address]                                           [phone] [address] Phone:  [phone]                                                  Phone:  [phone]  If unable to pay or uninsured, contact:  [facility] or [facility]. to become qualified for the adult dental clinic.  Chronic Pain Problems Contact [facility]  [phone] Patients need to be referred by their primary care doctor.  Insufficient Money for Medicine Contact United Way:  call "211" or [facility] [phone].  No Primary Care Doctor Call [facility]  [phone] Other agencies that provide inexpensive medical care  [facility]  [phone]    [facility]  [phone]    [facility]  [phone]    [facility]  [phone]    [facility]  [phone]    [facility]  [phone]  Psychological Services [facility]  [phone] [facility]  [phone] [facility]   [phone] (emergency services [phone])  Substance Abuse Resources [facility]  [phone] [facility] [phone] [facility] [place] [phone] Floydene Flock [phone] Residential & Outpatient Substance Abuse Program  [phone]  Abuse/Neglect [facility] Child Abuse Hotline 303-836-3648 [facility] Child Abuse Hotline 774-479-3159 (After  Hours)  Emergency Shelter [facility] [phone]  Maternity Homes [facility] [place] [facility] [place] [phone] [facility] [phone]  MRSA Hotline #:   402-023-2443    [facility] Resources  [facility] [place] [facility]     United [employer]                          [facility] Dept. [address] [place]                       [address]      [address]  Blondell Reveal Phone:  [phone]                                   Phone:  [phone]                 Phone:  [phone]  [facility] Phone:  [phone]  [facility] Child Abuse Hotline 9097399102 [phone] (After Hours)
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
## 2012-02-09 NOTE — ED Notes (Signed)
Discharge instructions reviewed with pt, questions answered. Pt verbalized understanding.  

## 2012-02-29 ENCOUNTER — Other Ambulatory Visit: Payer: Self-pay

## 2012-03-14 ENCOUNTER — Ambulatory Visit: Payer: Self-pay | Admitting: Internal Medicine

## 2012-03-14 ENCOUNTER — Telehealth: Payer: Self-pay | Admitting: *Deleted

## 2012-03-14 NOTE — Telephone Encounter (Signed)
Called patient to reschedule appointments, she no showed lab and f/u visit.  Mail box is full, unable to leave message. Wendall Mola

## 2012-03-20 IMAGING — US US TRANSVAGINAL NON-OB
1 series · 13 of 25 positions shown · non-contrast
Comparison: Pelvic ultrasound - 02/27/2007

CLINICAL DATA: IUD removed 1 month ago, now with pelvic pain,
evaluate for tubal ovarian abscess

TRANSABDOMINAL AND TRANSVAGINAL ULTRASOUND OF PELVIS
TECHNIQUE: Both transabdominal and transvaginal ultrasound
examinations of the pelvis were performed. Transabdominal technique
was performed for global imaging of the pelvis including uterus,
ovaries, adnexal regions, and pelvic cul-de-sac.

[Series 1: us transvaginal non-ob · 0.25mm/px · 107 acquisitions, 13 frames shown]
[im 1/107]
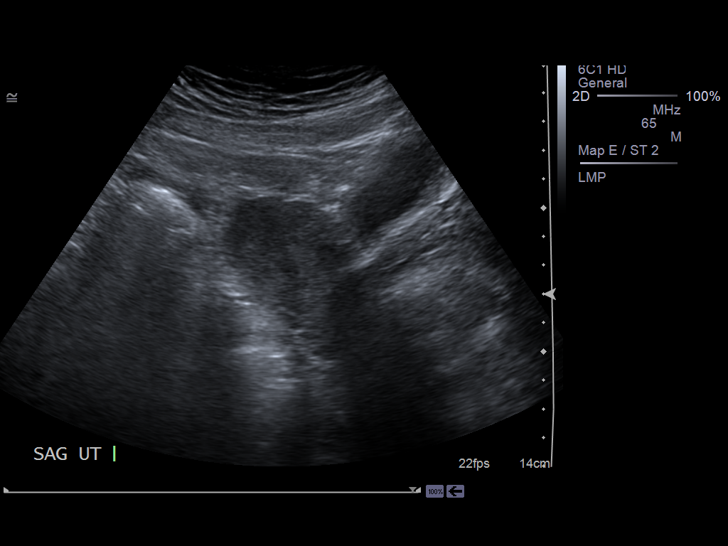
[im 9/107]
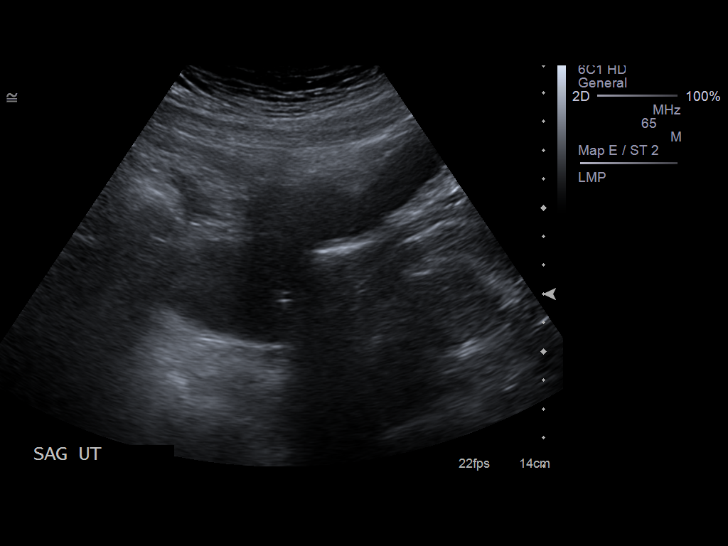
[im 18/107]
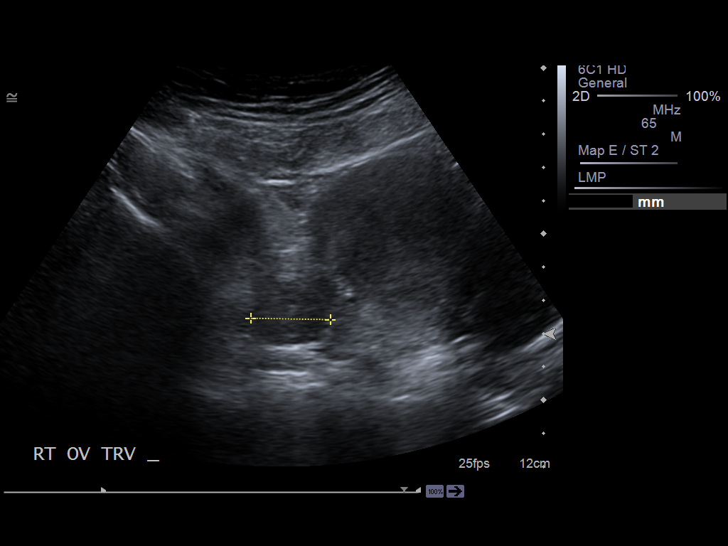
[im 27/107]
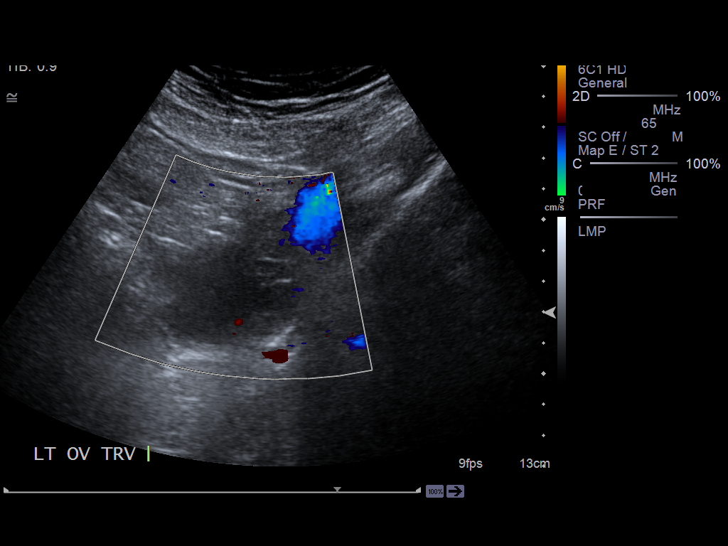
[im 36/107]
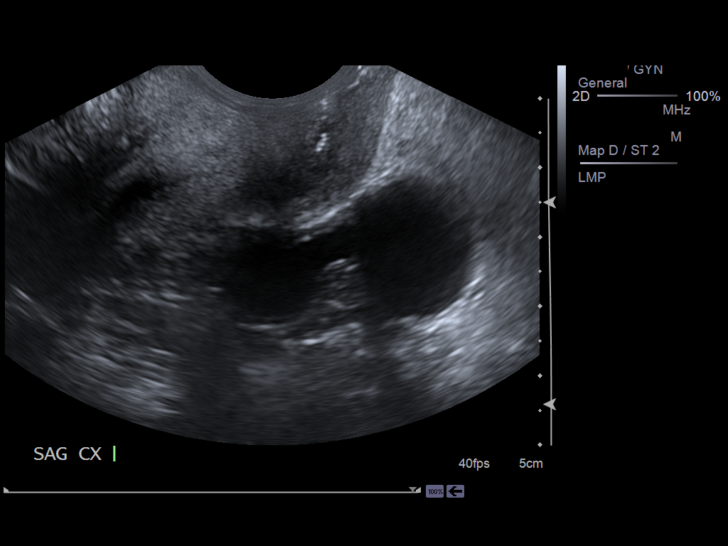
[im 45/107]
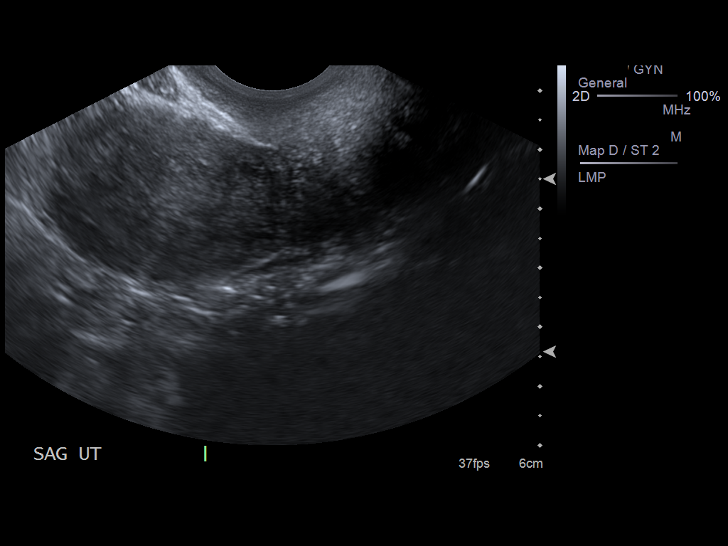
[im 54/107]
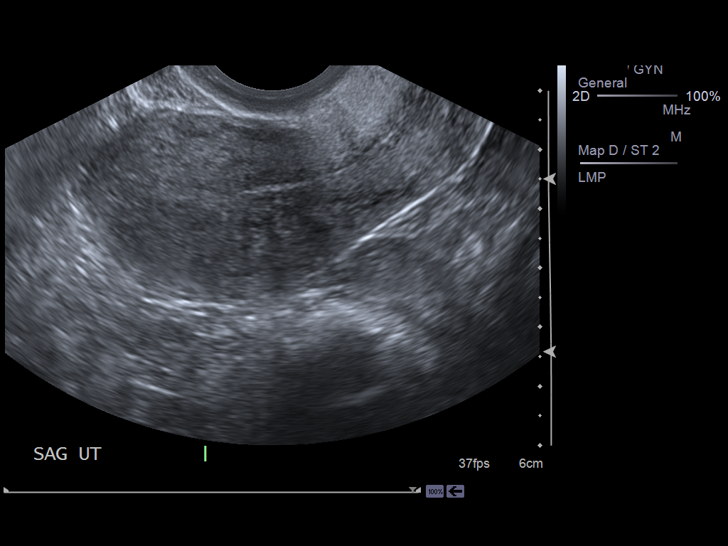
[im 62/107]
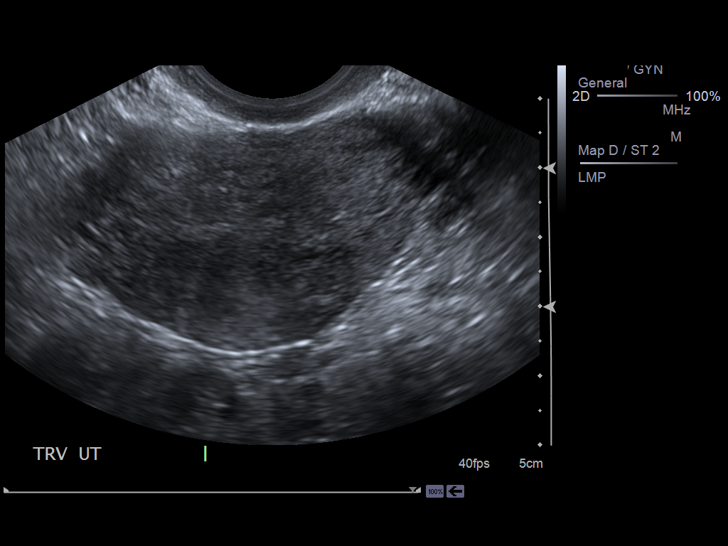
[im 71/107]
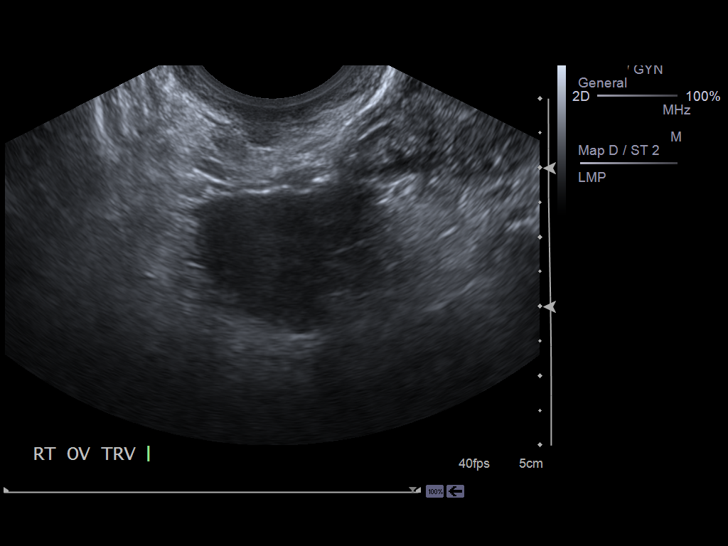
[im 80/107]
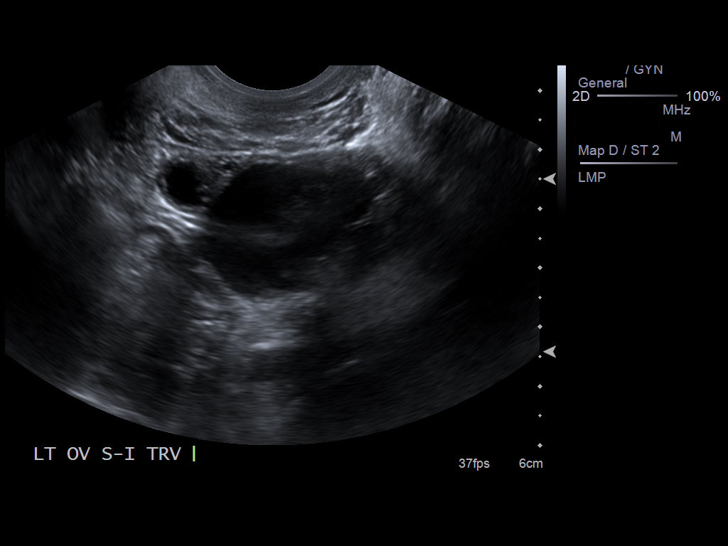
[im 89/107]
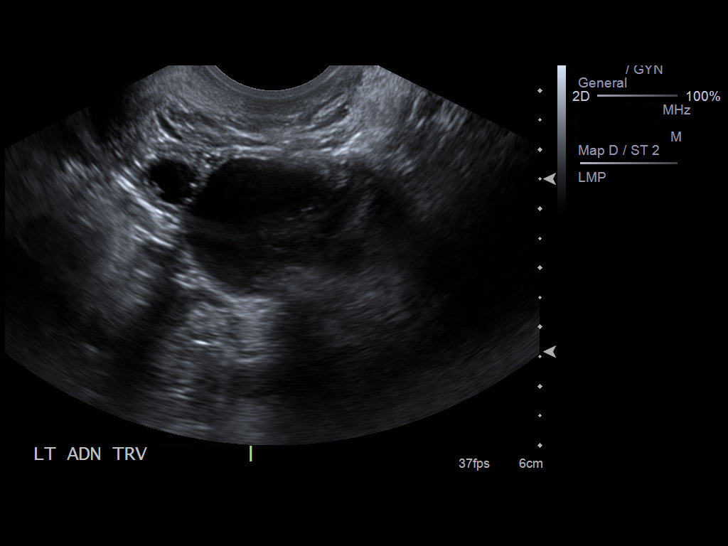
[im 98/107]
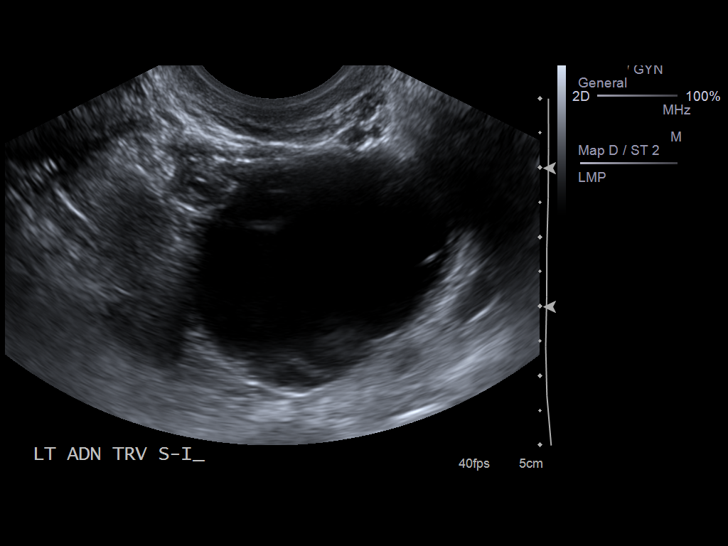
[im 107/107]
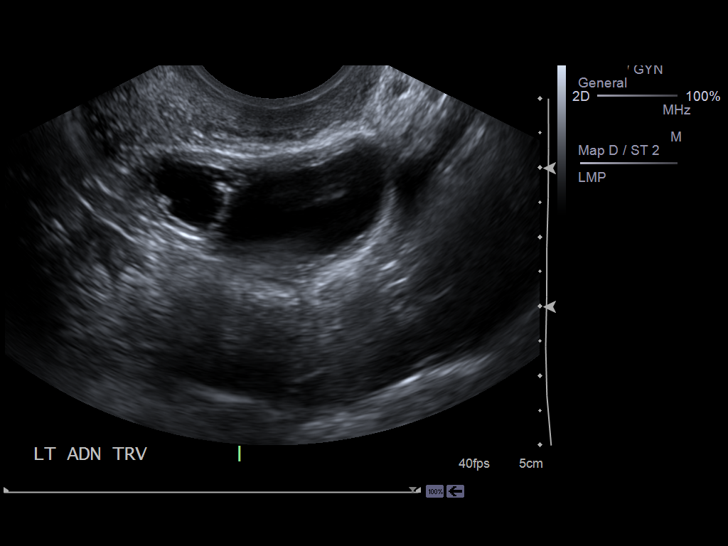

[13 of 25 positions shown; findings below may reference images not displayed]

It was necessary to proceed with endovaginal exam following the
transabdominal exam to visualize the endometrium and bilateral
ovaries.
FINDINGS: Uterus: The uterus is anteverted and normal in size, measuring
x 3.7 x 5.4 cm.  No discrete uterine mass.

Endometrium: Normal in size measuring 4.3 mm in diameter

Right ovary:  Normal in size measuring 2.4 x 1.5 x 2.2 cm.  No
right-sided ovarian lesions.

Left ovary: Normal in size measuring 2.6 x 1.5 x 3.0 cm.  There is
approximate 1.3 x 1.4 x 0.6 cm hypoechoic lesion within the left
ovary, possibly a dominant follicle.

Other findings: There is an approximately 6.0 x 3.5 x 3.9 cm
largely anechoic serpiginous/tubular lesion which demonstrates
internal echoes which is favored to represent a dilated fallopian
tube.
IMPRESSION: Dilated approximately 6 cm apparent tubular structure within the
left adnexa worrisome for a tubal ovarian abscess.  Follow up
examination after treatment is recommended to ensure resolution.

Above findings discussed with Kina, PA at [phone].

## 2012-04-05 ENCOUNTER — Other Ambulatory Visit (INDEPENDENT_AMBULATORY_CARE_PROVIDER_SITE_OTHER): Payer: Self-pay

## 2012-04-05 DIAGNOSIS — Z113 Encounter for screening for infections with a predominantly sexual mode of transmission: Secondary | ICD-10-CM

## 2012-04-05 DIAGNOSIS — B20 Human immunodeficiency virus [HIV] disease: Secondary | ICD-10-CM

## 2012-04-05 DIAGNOSIS — Z79899 Other long term (current) drug therapy: Secondary | ICD-10-CM

## 2012-04-05 LAB — CBC WITH DIFFERENTIAL/PLATELET
Basophils Absolute: 0 10*3/uL (ref 0.0–0.1)
Basophils Relative: 0 % (ref 0–1)
Eosinophils Absolute: 0.1 10*3/uL (ref 0.0–0.7)
Eosinophils Relative: 1 % (ref 0–5)
HCT: 43.4 % (ref 36.0–46.0)
Hemoglobin: 15.3 g/dL — ABNORMAL HIGH (ref 12.0–15.0)
Lymphocytes Relative: 41 % (ref 12–46)
Lymphs Abs: 1.8 10*3/uL (ref 0.7–4.0)
MCH: 34.9 pg — ABNORMAL HIGH (ref 26.0–34.0)
MCHC: 35.3 g/dL (ref 30.0–36.0)
MCV: 99.1 fL (ref 78.0–100.0)
Monocytes Absolute: 0.5 10*3/uL (ref 0.1–1.0)
Monocytes Relative: 10 % (ref 3–12)
Neutro Abs: 2.1 10*3/uL (ref 1.7–7.7)
Neutrophils Relative %: 48 % (ref 43–77)
Platelets: 279 10*3/uL (ref 150–400)
RBC: 4.38 MIL/uL (ref 3.87–5.11)
RDW: 14.2 % (ref 11.5–15.5)
WBC: 4.4 10*3/uL (ref 4.0–10.5)

## 2012-04-06 LAB — COMPLETE METABOLIC PANEL WITH GFR
ALT: 49 U/L — ABNORMAL HIGH (ref 0–35)
AST: 37 U/L (ref 0–37)
Albumin: 4 g/dL (ref 3.5–5.2)
Alkaline Phosphatase: 56 U/L (ref 39–117)
BUN: 12 mg/dL (ref 6–23)
CO2: 26 mEq/L (ref 19–32)
Calcium: 9.4 mg/dL (ref 8.4–10.5)
Chloride: 105 mEq/L (ref 96–112)
Creat: 0.83 mg/dL (ref 0.50–1.10)
GFR, Est African American: >89 mL/min
GFR, Est Non African American: 87 mL/min
Glucose, Bld: 90 mg/dL (ref 70–99)
Potassium: 4.5 mEq/L (ref 3.5–5.3)
Sodium: 139 mEq/L (ref 135–145)
Total Bilirubin: 0.4 mg/dL (ref 0.3–1.2)
Total Protein: 7 g/dL (ref 6.0–8.3)

## 2012-04-06 LAB — URINALYSIS, ROUTINE W REFLEX MICROSCOPIC
Bilirubin Urine: NEGATIVE
Glucose, UA: NEGATIVE mg/dL
Hgb urine dipstick: NEGATIVE
Ketones, ur: NEGATIVE mg/dL
Leukocytes, UA: NEGATIVE
Nitrite: NEGATIVE
Protein, ur: NEGATIVE mg/dL
Specific Gravity, Urine: 1.024 (ref 1.005–1.030)
Urobilinogen, UA: 0.2 mg/dL (ref 0.0–1.0)
pH: 6 (ref 5.0–8.0)

## 2012-04-06 LAB — LIPID PANEL
Cholesterol: 128 mg/dL (ref 0–200)
HDL: 38 mg/dL — ABNORMAL LOW (ref >39)
LDL Cholesterol: 72 mg/dL (ref 0–99)
Total CHOL/HDL Ratio: 3.4 Ratio
Triglycerides: 91 mg/dL (ref <150)
VLDL: 18 mg/dL (ref 0–40)

## 2012-04-06 LAB — T-HELPER CELL (CD4) - (RCID CLINIC ONLY)
CD4 % Helper T Cell: 7 % — ABNORMAL LOW (ref 33–55)
CD4 T Cell Abs: 130 uL — ABNORMAL LOW (ref 400–2700)

## 2012-04-06 LAB — HIV-1 RNA QUANT-NO REFLEX-BLD
HIV 1 RNA Quant: <20 copies/mL (ref <20)
HIV-1 RNA Quant, Log: <1.3 {Log} (ref <1.30)

## 2012-04-06 LAB — RPR

## 2012-04-25 ENCOUNTER — Encounter: Payer: Self-pay | Admitting: Infectious Diseases

## 2012-04-25 ENCOUNTER — Ambulatory Visit (INDEPENDENT_AMBULATORY_CARE_PROVIDER_SITE_OTHER): Payer: Self-pay | Admitting: Infectious Diseases

## 2012-04-25 VITALS — BP 107/77 | HR 65 | Temp 97.6°F | Ht 65.5 in | Wt 220.0 lb

## 2012-04-25 DIAGNOSIS — A6000 Herpesviral infection of urogenital system, unspecified: Secondary | ICD-10-CM

## 2012-04-25 DIAGNOSIS — M545 Low back pain, unspecified: Secondary | ICD-10-CM

## 2012-04-25 DIAGNOSIS — Z113 Encounter for screening for infections with a predominantly sexual mode of transmission: Secondary | ICD-10-CM

## 2012-04-25 DIAGNOSIS — B20 Human immunodeficiency virus [HIV] disease: Secondary | ICD-10-CM

## 2012-04-25 DIAGNOSIS — Z79899 Other long term (current) drug therapy: Secondary | ICD-10-CM

## 2012-04-25 DIAGNOSIS — H43399 Other vitreous opacities, unspecified eye: Secondary | ICD-10-CM

## 2012-04-25 MED ORDER — CYCLOBENZAPRINE HCL 5 MG PO TABS: 5.0000 mg | ORAL_TABLET | Freq: Three times a day (TID) | ORAL | Status: AC | PRN

## 2012-04-25 NOTE — Assessment & Plan Note (Signed)
Her CD4 has improved somewhat. She does not need both dapsone and bactrim. Will stop dapsone. Not clear she completed Hep B series. Will check Hep B SAb at next visit. Her vaccines are otherwise up to date. I offered her condoms and she laughs and says "I'm in jail!". Will see her back in 6 months with labs. Needs flu shot at jail in the next month.

## 2012-04-25 NOTE — Progress Notes (Signed)
  Subjective:    Patient ID: Nicole Silva, female    DOB: [birth year], 41 y.o.   MRN: [id number]  HPI 41 yo F with hx of HIV+ (since 56s) and Bipolar d/o. Taking ISN/TRV. In April was seen for LE pain, was scheduled for doppler (can't find result) and also first episode of genital HSV.  Today complains of muscle spasms in her legs, needs new rx for flexeril. Has noted spots in front of her eyes for last 3 months. Noticing easy bruising.  No further HSV outbreaks, was put on ACV "for a year". Is taking Dapsone and Bactrim.  Has had PAP this year, had tubal this spring due infections/pain after IUD removed.   HIV 1 RNA Quant (copies/mL)  Date Value  04/05/2012 <20   12/20/2011 34*  10/13/2011 <20      CD4 T Cell Abs (cmm)  Date Value  04/05/2012 130*  12/20/2011 150*  10/13/2011 80*      Review of Systems  Constitutional: Positive for unexpected weight change. Negative for appetite change.       Lost 20#  Gastrointestinal: Negative for diarrhea and constipation.  Genitourinary: Negative for dysuria and genital sores.  Musculoskeletal: Positive for myalgias.       Objective:   Physical Exam  Constitutional: She appears well-developed and well-nourished.  HENT:  Mouth/Throat: No oropharyngeal exudate.  Eyes: EOM are normal. Pupils are equal, round, and reactive to light.  Neck: Neck supple.  Cardiovascular: Normal rate, regular rhythm and normal heart sounds.   Pulmonary/Chest: Effort normal and breath sounds normal.  Abdominal: Soft. Bowel sounds are normal. She exhibits no distension. There is no tenderness.  Lymphadenopathy:    She has no cervical adenopathy.          Assessment & Plan:

## 2012-04-25 NOTE — Addendum Note (Signed)
Addended by: Izyk Marty C on: 04/25/2012 09:22 AM   Modules accepted: Orders

## 2012-04-25 NOTE — Assessment & Plan Note (Signed)
She has resolved this, but not cured. She can stop acyclovir. Could consider prophylaxis if she has multiple outbreaks/year.

## 2012-04-25 NOTE — Assessment & Plan Note (Signed)
Will refer her to Ophtho- will be difficult due to lack of finances. Multiple family members with Glaucoma.

## 2012-04-25 NOTE — Assessment & Plan Note (Signed)
Will refill her flexeril 

## 2012-05-09 ENCOUNTER — Other Ambulatory Visit: Payer: Self-pay | Admitting: Internal Medicine

## 2012-11-08 ENCOUNTER — Telehealth: Payer: Self-pay | Admitting: *Deleted

## 2012-11-08 NOTE — Telephone Encounter (Signed)
Left message on pts mother's phone to have pt to call [facility] for lab work and MD appt.  [facility] phone number given.

## 2013-03-27 ENCOUNTER — Telehealth: Payer: Self-pay | Admitting: *Deleted

## 2013-03-27 NOTE — Telephone Encounter (Signed)
Message left requesting pt to call [facility] for MD, PAP and lab work appts.

## 2014-08-02 ENCOUNTER — Telehealth: Payer: Self-pay | Admitting: *Deleted

## 2014-08-02 NOTE — Telephone Encounter (Signed)
Pt being released from [facility], [place], [place], 09/01/14.  Wanting to make follow-up appt soon after release.  To fax records.  Pt has been in prison about a year.

## 2014-08-13 ENCOUNTER — Telehealth: Payer: Self-pay | Admitting: *Deleted

## 2014-08-13 NOTE — Telephone Encounter (Signed)
Call from [name], patient is being released 09/01/14 and needing follow up appointment. Has had recent lab work and they are faxing records. Scheduled appointment with financial counselor for 09/04/14 and 30 minute appointment with Dr. Johnnye Sima for 09/18/14. Nicole Silva

## 2014-08-15 NOTE — Telephone Encounter (Signed)
[facility]  [phone] to notify that we did not receive the records. Message was left with Ms Marcello Moores to please fax notes ASAP.

## 2014-08-19 NOTE — Telephone Encounter (Signed)
Notes received and placed in Nicole Silva's office.

## 2014-09-04 ENCOUNTER — Ambulatory Visit: Payer: Self-pay

## 2014-09-04 ENCOUNTER — Other Ambulatory Visit (INDEPENDENT_AMBULATORY_CARE_PROVIDER_SITE_OTHER): Payer: Self-pay

## 2014-09-04 DIAGNOSIS — B20 Human immunodeficiency virus [HIV] disease: Secondary | ICD-10-CM

## 2014-09-04 DIAGNOSIS — Z113 Encounter for screening for infections with a predominantly sexual mode of transmission: Secondary | ICD-10-CM

## 2014-09-05 LAB — HEPATITIS B CORE ANTIBODY, TOTAL: Hep B Core Total Ab: REACTIVE — AB

## 2014-09-05 LAB — COMPLETE METABOLIC PANEL WITH GFR
ALT: 36 U/L — ABNORMAL HIGH (ref 0–35)
AST: 40 U/L — ABNORMAL HIGH (ref 0–37)
Albumin: 3.9 g/dL (ref 3.5–5.2)
Alkaline Phosphatase: 105 U/L (ref 39–117)
BUN: 8 mg/dL (ref 6–23)
CO2: 26 mEq/L (ref 19–32)
Calcium: 9.7 mg/dL (ref 8.4–10.5)
Chloride: 102 mEq/L (ref 96–112)
Creat: 0.64 mg/dL (ref 0.50–1.10)
GFR, Est African American: >89 mL/min
GFR, Est Non African American: >89 mL/min
Glucose, Bld: 103 mg/dL — ABNORMAL HIGH (ref 70–99)
Potassium: 4.2 mEq/L (ref 3.5–5.3)
Sodium: 137 mEq/L (ref 135–145)
Total Bilirubin: 1.6 mg/dL — ABNORMAL HIGH (ref 0.2–1.2)
Total Protein: 7.8 g/dL (ref 6.0–8.3)

## 2014-09-05 LAB — CBC WITH DIFFERENTIAL/PLATELET
Basophils Absolute: 0 10*3/uL (ref 0.0–0.1)
Basophils Relative: 1 % (ref 0–1)
Eosinophils Absolute: 0 10*3/uL (ref 0.0–0.7)
Eosinophils Relative: 1 % (ref 0–5)
HCT: 45.1 % (ref 36.0–46.0)
Hemoglobin: 15.4 g/dL — ABNORMAL HIGH (ref 12.0–15.0)
Lymphocytes Relative: 37 % (ref 12–46)
Lymphs Abs: 1.8 10*3/uL (ref 0.7–4.0)
MCH: 34.5 pg — ABNORMAL HIGH (ref 26.0–34.0)
MCHC: 34.1 g/dL (ref 30.0–36.0)
MCV: 100.9 fL — ABNORMAL HIGH (ref 78.0–100.0)
MPV: 10.6 fL (ref 9.4–12.4)
Monocytes Absolute: 0.5 10*3/uL (ref 0.1–1.0)
Monocytes Relative: 10 % (ref 3–12)
Neutro Abs: 2.4 10*3/uL (ref 1.7–7.7)
Neutrophils Relative %: 51 % (ref 43–77)
Platelets: 203 10*3/uL (ref 150–400)
RBC: 4.47 MIL/uL (ref 3.87–5.11)
RDW: 13.6 % (ref 11.5–15.5)
WBC: 4.8 10*3/uL (ref 4.0–10.5)

## 2014-09-05 LAB — LIPID PANEL
Cholesterol: 131 mg/dL (ref 0–200)
HDL: 36 mg/dL — ABNORMAL LOW (ref >39)
LDL Cholesterol: 68 mg/dL (ref 0–99)
Total CHOL/HDL Ratio: 3.6 Ratio
Triglycerides: 134 mg/dL (ref <150)
VLDL: 27 mg/dL (ref 0–40)

## 2014-09-05 LAB — HEPATITIS C ANTIBODY: HCV Ab: REACTIVE — AB

## 2014-09-05 LAB — T-HELPER CELL (CD4) - (RCID CLINIC ONLY)
CD4 % Helper T Cell: 11 % — ABNORMAL LOW (ref 33–55)
CD4 T Cell Abs: 210 /uL — ABNORMAL LOW (ref 400–2700)

## 2014-09-05 LAB — HEPATITIS B SURFACE ANTIGEN: Hepatitis B Surface Ag: NEGATIVE

## 2014-09-05 LAB — HEPATITIS A ANTIBODY, TOTAL: Hep A Total Ab: REACTIVE — AB

## 2014-09-05 LAB — HEPATITIS B SURFACE ANTIBODY,QUALITATIVE: Hep B S Ab: NEGATIVE

## 2014-09-05 LAB — RPR: RPR Ser Ql: REACTIVE — AB

## 2014-09-05 LAB — FLUORESCENT TREPONEMAL AB(FTA)-IGG-BLD: Fluorescent Treponemal ABS: REACTIVE — AB

## 2014-09-05 LAB — RPR TITER: RPR Titer: 1:1 {titer}

## 2014-09-07 LAB — HIV-1 RNA ULTRAQUANT REFLEX TO GENTYP+
HIV 1 RNA Quant: <20 copies/mL (ref <20)
HIV-1 RNA Quant, Log: <1.3 {Log} (ref <1.30)

## 2014-09-07 LAB — HEPATITIS C RNA QUANTITATIVE
HCV Quantitative Log: 6.73 {Log} — ABNORMAL HIGH (ref <1.18)
HCV Quantitative: 5332066 IU/mL — ABNORMAL HIGH (ref <15)

## 2014-09-07 LAB — QUANTIFERON TB GOLD ASSAY (BLOOD)
Interferon Gamma Release Assay: NEGATIVE
Mitogen value: 4.07 IU/mL
Quantiferon Nil Value: 0.04 IU/mL
Quantiferon Tb Ag Minus Nil Value: 0.01 IU/mL
TB Ag value: 0.05 IU/mL

## 2014-09-09 ENCOUNTER — Other Ambulatory Visit: Payer: Self-pay | Admitting: *Deleted

## 2014-09-09 DIAGNOSIS — B20 Human immunodeficiency virus [HIV] disease: Secondary | ICD-10-CM

## 2014-09-09 MED ORDER — EMTRICITABINE-TENOFOVIR DF 200-300 MG PO TABS: 1.0000 | ORAL_TABLET | Freq: Every day | ORAL | Status: DC

## 2014-09-09 MED ORDER — RALTEGRAVIR POTASSIUM 400 MG PO TABS: 400.0000 mg | ORAL_TABLET | Freq: Two times a day (BID) | ORAL | Status: DC

## 2014-09-09 NOTE — Telephone Encounter (Signed)
ADAP Application 

## 2014-09-11 LAB — HLA B*5701: HLA-B*5701 w/rflx HLA-B High: NEGATIVE

## 2014-09-18 ENCOUNTER — Ambulatory Visit: Payer: Self-pay | Admitting: Infectious Diseases

## 2014-09-18 ENCOUNTER — Other Ambulatory Visit: Payer: Self-pay | Admitting: *Deleted

## 2014-09-18 DIAGNOSIS — B20 Human immunodeficiency virus [HIV] disease: Secondary | ICD-10-CM

## 2014-09-18 MED ORDER — EMTRICITABINE-TENOFOVIR DF 200-300 MG PO TABS: 1.0000 | ORAL_TABLET | Freq: Every day | ORAL | Status: DC

## 2014-09-18 MED ORDER — RALTEGRAVIR POTASSIUM 400 MG PO TABS: 400.0000 mg | ORAL_TABLET | Freq: Two times a day (BID) | ORAL | Status: DC

## 2014-09-18 NOTE — Telephone Encounter (Signed)
ADAP Application 

## 2014-09-26 ENCOUNTER — Encounter (HOSPITAL_COMMUNITY): Payer: Self-pay | Admitting: Obstetrics and Gynecology

## 2014-10-09 ENCOUNTER — Ambulatory Visit: Payer: Self-pay | Admitting: Infectious Disease

## 2014-10-10 ENCOUNTER — Encounter: Payer: Self-pay | Admitting: Infectious Diseases

## 2014-10-10 ENCOUNTER — Other Ambulatory Visit: Payer: Self-pay | Admitting: *Deleted

## 2014-10-10 ENCOUNTER — Ambulatory Visit (INDEPENDENT_AMBULATORY_CARE_PROVIDER_SITE_OTHER): Payer: Medicaid Other | Admitting: Infectious Diseases

## 2014-10-10 VITALS — BP 117/74 | HR 72 | Temp 98.1°F | Wt 262.0 lb

## 2014-10-10 DIAGNOSIS — Z79899 Other long term (current) drug therapy: Secondary | ICD-10-CM

## 2014-10-10 DIAGNOSIS — Z113 Encounter for screening for infections with a predominantly sexual mode of transmission: Secondary | ICD-10-CM

## 2014-10-10 DIAGNOSIS — B20 Human immunodeficiency virus [HIV] disease: Secondary | ICD-10-CM

## 2014-10-10 DIAGNOSIS — B1710 Acute hepatitis C without hepatic coma: Secondary | ICD-10-CM | POA: Diagnosis not present

## 2014-10-10 DIAGNOSIS — K029 Dental caries, unspecified: Secondary | ICD-10-CM | POA: Diagnosis not present

## 2014-10-10 MED ORDER — ATAZANAVIR SULFATE 300 MG PO CAPS: 300.0000 mg | ORAL_CAPSULE | Freq: Every day | ORAL | Status: DC

## 2014-10-10 MED ORDER — EMTRICITABINE-TENOFOVIR DF 200-300 MG PO TABS: 1.0000 | ORAL_TABLET | Freq: Every day | ORAL | Status: DC

## 2014-10-10 MED ORDER — RITONAVIR 100 MG PO TABS: 100.0000 mg | ORAL_TABLET | Freq: Every day | ORAL | Status: DC

## 2014-10-10 NOTE — Progress Notes (Signed)
   Subjective:    Patient ID: Nicole Silva, female    DOB: Oct 16, 1970, 44 y.o.   MRN: [id number]  HPI  44 yo F with hx of HIV+ (since 1990s), hepatitis C, and Bipolar d/o. Taking ISN/TRV. Now recently out of prison. Has been working with [facility], has 1 pill of ART left.  Has lesion on side of tongue on R. Very tender.  Taking TRV/ATVr.   HIV 1 RNA QUANT (copies/mL)  Date Value  09/04/2014 <20  04/05/2012 <20  12/20/2011 34*   CD4 T CELL ABS  Date Value  09/04/2014 210 /uL*  04/05/2012 130 cmm*  12/20/2011 150 cmm*   > 1 yr since PAP, mammo.   Review of Systems  Constitutional: Negative for appetite change and unexpected weight change.  HENT: Positive for mouth sores.   Gastrointestinal: Positive for diarrhea. Negative for nausea.  Genitourinary: Negative for difficulty urinating.  Psychiatric/Behavioral: The patient is nervous/anxious.       Objective:   Physical Exam  Constitutional: She appears well-developed and well-nourished.  HENT:  Mouth/Throat: No oropharyngeal exudate.  Eyes: EOM are normal. Pupils are equal, round, and reactive to light.  Neck: Neck supple.  Cardiovascular: Normal rate, regular rhythm and normal heart sounds.   Pulmonary/Chest: Effort normal and breath sounds normal.  Abdominal: Soft. Bowel sounds are normal. She exhibits no distension. There is no tenderness.  Lymphadenopathy:    She has no cervical adenopathy.          Assessment & Plan:

## 2014-10-10 NOTE — Assessment & Plan Note (Signed)
Will start screening process, get her orange card.

## 2014-10-10 NOTE — Assessment & Plan Note (Signed)
Will reschedule with dental.

## 2014-10-10 NOTE — Assessment & Plan Note (Signed)
Will refill her rx's today.  She is given condoms Will get her pap, mammo.  Will see her back in 3 months She has had flu shot.

## 2014-10-21 ENCOUNTER — Ambulatory Visit: Payer: Self-pay | Admitting: Infectious Diseases

## 2014-12-26 ENCOUNTER — Other Ambulatory Visit: Payer: Self-pay

## 2015-01-09 ENCOUNTER — Ambulatory Visit: Payer: Self-pay | Admitting: Infectious Diseases

## 2015-01-29 ENCOUNTER — Ambulatory Visit (HOSPITAL_COMMUNITY)
Admission: RE | Admit: 2015-01-29 | Discharge: 2015-01-29 | Disposition: A | Discharge status: Home / Self Care | Payer: Self-pay | Source: Ambulatory Visit | Attending: Infectious Disease | Admitting: Infectious Disease

## 2015-01-29 ENCOUNTER — Ambulatory Visit (INDEPENDENT_AMBULATORY_CARE_PROVIDER_SITE_OTHER): Payer: Medicaid Other | Admitting: Infectious Disease

## 2015-01-29 ENCOUNTER — Encounter: Payer: Self-pay | Admitting: Infectious Disease

## 2015-01-29 VITALS — BP 109/83 | HR 77 | Temp 97.9°F | Wt 249.0 lb

## 2015-01-29 DIAGNOSIS — Z720 Tobacco use: Secondary | ICD-10-CM

## 2015-01-29 DIAGNOSIS — Z87898 Personal history of other specified conditions: Secondary | ICD-10-CM

## 2015-01-29 DIAGNOSIS — R509 Fever, unspecified: Secondary | ICD-10-CM | POA: Insufficient documentation

## 2015-01-29 DIAGNOSIS — Z113 Encounter for screening for infections with a predominantly sexual mode of transmission: Secondary | ICD-10-CM | POA: Diagnosis not present

## 2015-01-29 DIAGNOSIS — J441 Chronic obstructive pulmonary disease with (acute) exacerbation: Secondary | ICD-10-CM | POA: Insufficient documentation

## 2015-01-29 DIAGNOSIS — B20 Human immunodeficiency virus [HIV] disease: Secondary | ICD-10-CM | POA: Diagnosis not present

## 2015-01-29 DIAGNOSIS — B182 Chronic viral hepatitis C: Secondary | ICD-10-CM

## 2015-01-29 DIAGNOSIS — F1990 Other psychoactive substance use, unspecified, uncomplicated: Secondary | ICD-10-CM | POA: Diagnosis not present

## 2015-01-29 DIAGNOSIS — F17200 Nicotine dependence, unspecified, uncomplicated: Secondary | ICD-10-CM

## 2015-01-29 DIAGNOSIS — F1991 Other psychoactive substance use, unspecified, in remission: Secondary | ICD-10-CM

## 2015-01-29 HISTORY — DX: Personal history of other specified conditions: Z87.898

## 2015-01-29 HISTORY — DX: Chronic viral hepatitis C: B18.2

## 2015-01-29 HISTORY — DX: Nicotine dependence, unspecified, uncomplicated: F17.200

## 2015-01-29 HISTORY — DX: Chronic obstructive pulmonary disease with (acute) exacerbation: J44.1

## 2015-01-29 HISTORY — DX: Other psychoactive substance use, unspecified, in remission: F19.91

## 2015-01-29 LAB — COMPLETE METABOLIC PANEL WITH GFR
ALT: 44 U/L — ABNORMAL HIGH (ref 0–35)
AST: 52 U/L — ABNORMAL HIGH (ref 0–37)
Albumin: 3.9 g/dL (ref 3.5–5.2)
Alkaline Phosphatase: 110 U/L (ref 39–117)
BUN: 9 mg/dL (ref 6–23)
CO2: 25 mEq/L (ref 19–32)
Calcium: 9.1 mg/dL (ref 8.4–10.5)
Chloride: 100 mEq/L (ref 96–112)
Creat: 0.72 mg/dL (ref 0.50–1.10)
GFR, Est African American: >89 mL/min
GFR, Est Non African American: >89 mL/min
Glucose, Bld: 98 mg/dL (ref 70–99)
Potassium: 4.2 mEq/L (ref 3.5–5.3)
Sodium: 134 mEq/L — ABNORMAL LOW (ref 135–145)
Total Bilirubin: 0.6 mg/dL (ref 0.2–1.2)
Total Protein: 7.7 g/dL (ref 6.0–8.3)

## 2015-01-29 LAB — CBC WITH DIFFERENTIAL/PLATELET
Basophils Absolute: 0 10*3/uL (ref 0.0–0.1)
Basophils Relative: 0 % (ref 0–1)
Eosinophils Absolute: 0.1 10*3/uL (ref 0.0–0.7)
Eosinophils Relative: 2 % (ref 0–5)
HCT: 46.6 % — ABNORMAL HIGH (ref 36.0–46.0)
Hemoglobin: 16.2 g/dL — ABNORMAL HIGH (ref 12.0–15.0)
Lymphocytes Relative: 41 % (ref 12–46)
Lymphs Abs: 2.8 10*3/uL (ref 0.7–4.0)
MCH: 36.2 pg — ABNORMAL HIGH (ref 26.0–34.0)
MCHC: 34.8 g/dL (ref 30.0–36.0)
MCV: 104 fL — ABNORMAL HIGH (ref 78.0–100.0)
MPV: 9.3 fL (ref 8.6–12.4)
Monocytes Absolute: 0.7 10*3/uL (ref 0.1–1.0)
Monocytes Relative: 10 % (ref 3–12)
Neutro Abs: 3.2 10*3/uL (ref 1.7–7.7)
Neutrophils Relative %: 47 % (ref 43–77)
Platelets: 288 10*3/uL (ref 150–400)
RBC: 4.48 MIL/uL (ref 3.87–5.11)
RDW: 14.3 % (ref 11.5–15.5)
WBC: 6.9 10*3/uL (ref 4.0–10.5)

## 2015-01-29 MED ORDER — PREDNISONE 20 MG PO TABS: 60.0000 mg | ORAL_TABLET | Freq: Every day | ORAL | Status: DC

## 2015-01-29 MED ORDER — BECLOMETHASONE DIPROPIONATE 80 MCG/ACT IN AERS: 2.0000 | INHALATION_SPRAY | Freq: Two times a day (BID) | RESPIRATORY_TRACT | Status: DC

## 2015-01-29 MED ORDER — ALBUTEROL SULFATE (2.5 MG/3ML) 0.083% IN NEBU
2.5000 mg | INHALATION_SOLUTION | Freq: Once | RESPIRATORY_TRACT | Status: AC
  Administered 2015-01-29: 2.5 mg via RESPIRATORY_TRACT

## 2015-01-29 MED ORDER — LEVOFLOXACIN 500 MG PO TABS: 500.0000 mg | ORAL_TABLET | Freq: Every day | ORAL | Status: DC

## 2015-01-29 MED ORDER — ALBUTEROL SULFATE HFA 108 (90 BASE) MCG/ACT IN AERS
2.0000 | INHALATION_SPRAY | Freq: Four times a day (QID) | RESPIRATORY_TRACT | Status: DC | PRN
Start: 2015-01-29 — End: 2015-03-07

## 2015-01-29 NOTE — Patient Instructions (Signed)
We wil start prednisone 60mg  daily x 7 days  levaquin 500mg  daily x 5 days  I am renewing your albuterol and rx for QVAR  We need bloodwork and CXR today

## 2015-01-29 NOTE — Progress Notes (Signed)
Subjective:    Patient ID: Nicole Silva, female    DOB: [birth year], 44 y.o.   MRN: [id number]  HPI  44 year old with HIV and hepatitis C coinfection recently released from prison. She claims to be highly compliant with ARV. She presents today acutely with complaint of shortness of breath and cough with productive sputum, wheezing that has worsened over the past 3-4 weeks also with subjective fevers and night sweats. She is taking her albuterol but nearly out taking QID, not on long acting inhaled steroids.   No sick contacts.    Review of Systems  Constitutional: Negative for fever, chills, diaphoresis, activity change, appetite change, fatigue and unexpected weight change.  HENT: Positive for congestion. Negative for rhinorrhea, sinus pressure, sneezing, sore throat and trouble swallowing.   Eyes: Negative for photophobia and visual disturbance.  Respiratory: Positive for cough, shortness of breath and wheezing. Negative for chest tightness and stridor.   Cardiovascular: Negative for chest pain, palpitations and leg swelling.  Gastrointestinal: Negative for nausea, vomiting, abdominal pain, diarrhea, constipation, blood in stool, abdominal distention and anal bleeding.  Genitourinary: Negative for dysuria, hematuria, flank pain and difficulty urinating.  Musculoskeletal: Negative for myalgias, back pain, joint swelling, arthralgias and gait problem.  Skin: Negative for color change, pallor, rash and wound.  Neurological: Negative for dizziness, tremors, weakness and light-headedness.  Hematological: Negative for adenopathy. Does not bruise/bleed easily.  Psychiatric/Behavioral: Negative for behavioral problems, confusion, sleep disturbance, dysphoric mood, decreased concentration and agitation.       Objective:   Physical Exam  Constitutional: She is oriented to person, place, and time. She appears well-developed and well-nourished. No distress.  HENT:  Head: Normocephalic and  atraumatic.  Mouth/Throat: No oropharyngeal exudate.  Eyes: Conjunctivae and EOM are normal. No scleral icterus.  Neck: Normal range of motion. Neck supple.  Cardiovascular: Normal rate and regular rhythm.   Pulmonary/Chest: She has wheezes. She has rales.  Prolonged expiratory phase  Abdominal: Soft. Bowel sounds are normal. She exhibits no distension.  Musculoskeletal: She exhibits no edema or tenderness.  Neurological: She is alert and oriented to person, place, and time. She exhibits normal muscle tone. Coordination normal.  Skin: Skin is warm and dry. No rash noted. She is not diaphoretic. No erythema. No pallor.  Psychiatric: Her behavior is normal. Judgment and thought content normal. Her mood appears anxious.          Assessment & Plan:   #1 COPD exacerbation:  Gave Nebulized bronchodilator and pt had improved airway and visibly more comfortable after treatment Ordered CXR which I personally reviewed  did not show infiltrates Started levaquin 500mg  daily x 5 days (pt with PCN going back to childhood) Prednisone 60mg  daily x 10 days Renewed beta agonist Wrote for QVAR inhalers  #2 HIV: check HIV rna, CD4, GENOSURE ARCHIVE from [facility] Use above as means to reconstruct rx regimen for her HCV  #3 HCV: check CMP, genotype, NS5A genotype, INR, calculate APRI and FIB4 Will need to go for pharmaceutical assistance. NO active IVDU for decades. No Etoh use  #4 Prior IVDU: remote more than 20 years ago she says  #5 Smoking: she has not smoked in past 3 weeks and needs stop altogether.  I spent greater than 40 minutes with the patient including greater than 50% of time in face to face counsel of the patient re her HIV, her HCV, prior substance abuse and how we need to manage her COPD and in coordination of their care.

## 2015-01-30 LAB — RPR

## 2015-01-30 LAB — PROTIME-INR
INR: 0.98 (ref <1.50)
Prothrombin Time: 13 seconds (ref 11.6–15.2)

## 2015-01-31 LAB — HIV-1 RNA QUANT-NO REFLEX-BLD
HIV 1 RNA Quant: 38 copies/mL — ABNORMAL HIGH (ref <20)
HIV-1 RNA Quant, Log: 1.58 {Log} — ABNORMAL HIGH (ref <1.30)

## 2015-01-31 LAB — T-HELPER CELL (CD4) - (RCID CLINIC ONLY)
CD4 % Helper T Cell: 8 % — ABNORMAL LOW (ref 33–55)
CD4 T Cell Abs: 240 /uL — ABNORMAL LOW (ref 400–2700)

## 2015-02-03 NOTE — Addendum Note (Signed)
Addended by: Lorne Skeens D on: 02/03/2015 05:10 PM   Modules accepted: Orders

## 2015-02-03 NOTE — Addendum Note (Signed)
Addended by: Lorne Skeens D on: 02/03/2015 05:01 PM   Modules accepted: Orders

## 2015-02-04 LAB — HEPATITIS C RNA QUANTITATIVE
HCV Quantitative Log: 6.83 {Log} — ABNORMAL HIGH (ref <1.18)
HCV Quantitative: 6834648 IU/mL — ABNORMAL HIGH (ref <15)

## 2015-02-07 LAB — HEPATITIS C GENOTYPE

## 2015-02-11 ENCOUNTER — Other Ambulatory Visit: Payer: Self-pay

## 2015-02-11 ENCOUNTER — Telehealth: Payer: Self-pay

## 2015-02-11 DIAGNOSIS — J449 Chronic obstructive pulmonary disease, unspecified: Secondary | ICD-10-CM

## 2015-02-11 NOTE — Telephone Encounter (Signed)
I am not familiar with what would qualify her for food stamps. I can write a note excusing her from time from work for COPD exacerbation. She DOES need to also followup with her PCP and to establish care with pulmonologist  I need to know how much time from work she is needing an excuse from, from which date to what date?

## 2015-02-11 NOTE — Telephone Encounter (Signed)
Patient notified of referral

## 2015-02-11 NOTE — Telephone Encounter (Signed)
Patient is requesting note for work stating she is not able to work due to breathing problems/ COPD. She has requested Dr Tommy Medal write the note since he diagnosed her. Patient is not currently working and is requesting  note so she can qualify for food stamps.   She has not been evaluated by pulmonary.   Please advise.     Laverle Patter, RN

## 2015-02-26 ENCOUNTER — Ambulatory Visit: Payer: Self-pay | Admitting: Infectious Diseases

## 2015-03-04 ENCOUNTER — Other Ambulatory Visit: Payer: Self-pay | Admitting: Pharmacist Clinician (PhC)/ Clinical Pharmacy Specialist

## 2015-03-04 ENCOUNTER — Other Ambulatory Visit: Payer: Self-pay | Admitting: Pharmacist

## 2015-03-04 MED ORDER — DOLUTEGRAVIR SODIUM 50 MG PO TABS: 50.0000 mg | ORAL_TABLET | Freq: Every day | ORAL | Status: DC

## 2015-03-04 MED ORDER — EMTRICITABINE-TENOFOVIR AF 200-25 MG PO TABS: 1.0000 | ORAL_TABLET | Freq: Every day | ORAL | Status: DC

## 2015-03-07 ENCOUNTER — Encounter: Payer: Self-pay | Admitting: Internal Medicine

## 2015-03-07 ENCOUNTER — Ambulatory Visit (INDEPENDENT_AMBULATORY_CARE_PROVIDER_SITE_OTHER): Payer: Medicaid Other | Admitting: Internal Medicine

## 2015-03-07 VITALS — BP 122/68 | HR 77 | Ht 66.0 in | Wt 241.6 lb

## 2015-03-07 DIAGNOSIS — J45991 Cough variant asthma: Secondary | ICD-10-CM

## 2015-03-07 DIAGNOSIS — Z720 Tobacco use: Secondary | ICD-10-CM

## 2015-03-07 DIAGNOSIS — F17200 Nicotine dependence, unspecified, uncomplicated: Secondary | ICD-10-CM

## 2015-03-07 MED ORDER — MOMETASONE FURO-FORMOTEROL FUM 100-5 MCG/ACT IN AERO: INHALATION_SPRAY | RESPIRATORY_TRACT | Status: DC

## 2015-03-07 MED ORDER — FAMOTIDINE 20 MG PO TABS: ORAL_TABLET | ORAL | Status: DC

## 2015-03-07 NOTE — Patient Instructions (Addendum)
Try dulera 100 Take 2 puffs first thing in am and then another 2 puffs about 12 hours later.   Only use your albuterol nebulizer as a rescue medication to be used if you can't catch your breath by resting or doing a relaxed purse lip breathing pattern.  - The less you use it, the better it will work when you need it. - Ok to use up to every 4 hours if you must but call for immediate appointment if use goes up over your usual need - Don't leave home without it !!  (think of it like the spare tire for your car)   Pepcid 20 ac at bedtime automatically   GERD (REFLUX)  is an extremely common cause of respiratory symptoms just like yours , many times with no obvious heartburn at all.    It can be treated with medication, but also with lifestyle changes including elevation of the head of your bed (ideally with 6 inch  bed blocks),  Smoking cessation, avoidance of late meals, excessive alcohol, and avoid fatty foods, chocolate, peppermint, colas, red wine, and acidic juices such as orange juice.  NO MINT OR MENTHOL PRODUCTS SO NO COUGH DROPS  USE SUGARLESS CANDY INSTEAD (Jolley ranchers or Stover's or Life Savers) or even ice chips will also do - the key is to swallow to prevent all throat clearing. NO OIL BASED VITAMINS - use powdered substitutes.   Please schedule a follow up office visit in 2 weeks, sooner if needed with all meds and neb solutions in hand and she'll set follow up with me

## 2015-03-07 NOTE — Assessment & Plan Note (Signed)
  Poorly controlled symptoms x years, much worse x 6 m  DDX of  difficult airways management all start with A and  include Adherence, Ace Inhibitors, Acid Reflux, Active Sinus Disease, Alpha 1 Antitripsin deficiency, Anxiety masquerading as Airways dz,  ABPA,  allergy(esp in young), Aspiration (esp in elderly), Adverse effects of meds,  Active smokers, A bunch of PE's (a small clot burden can't cause this syndrome unless there is already severe underlying pulm or vascular dz with poor reserve) plus two Bs  = Bronchiectasis and Beta blocker use..and one C= CHF  Adherence is always the initial "prime suspect" and is a multilayered concern that requires a "trust but verify" approach in every patient - starting with knowing how to use medications, especially inhalers, correctly, keeping up with refills and understanding the fundamental difference between maintenance and prns vs those medications only taken for a very short course and then stopped and not refilled.  -The proper method of use, as well as anticipated side effects, of a metered-dose inhaler are discussed and demonstrated to the patient. Improved effectiveness after extensive coaching during this visit to a level of approximately  75%   ? Acid (or non-acid) GERD > always difficult to exclude as up to 75% of pts in some series report no assoc GI/ Heartburn symptoms> rec max noct acid suppression and diet restrictions/ reviewed and instructions given in writing.   Active smoking > see sep a/p  Each maintenance medication was reviewed in detail including most importantly the difference between maintenance and as needed and under what circumstances the prns are to be used.  Please see instructions for details which were reviewed in writing and the patient given a copy.

## 2015-03-07 NOTE — Assessment & Plan Note (Signed)

## 2015-03-07 NOTE — Progress Notes (Signed)
Subjective:    Patient ID: Nicole Silva, female    DOB: 1971-05-29,    MRN: [id number]  HPI  44 yowf active smoker "short of breath all her life "  Dating to dx asthma as child With inhalers intil age [age] and needed something ever since and worse than usual since [place] 2016 so referred to pulmonary 03/07/2015 by  Tommy Medal    03/07/2015 1st [facility] office visit/ Hosey Burmester   Chief Complaint  Patient presents with  . Pulmonary Consult    Referred by Dr. Tommy Medal. Pt states dxed with Asthma as a child. She c/o SOB "always"- worse for the past 6 months. She also c/o cough- prod with yellow sputum.   sleeping on side is ok but never able to sleep on back, cough worse in am with min yellow mucus, no better on qvar and just using saba neb up to 3 x daily prn  No obvious other patterns in day to day or daytime variabilty or assoc  cp or chest tightness, subjective wheeze overt sinus or hb symptoms. No unusual exp hx or h/o childhood pna/ asthma or knowledge of premature birth.  Sleeping ok without nocturnal  or early am exacerbation  of respiratory  c/o's or need for noct saba. Also denies any obvious fluctuation of symptoms with weather or environmental changes or other aggravating or alleviating factors except as outlined above   Current Medications, Allergies, Complete Past Medical History, Past Surgical History, Family History, and Social History were reviewed in [employer] record.            Review of Systems  Constitutional: Negative for fever, chills and unexpected weight change.  HENT: Negative for congestion, dental problem, ear pain, nosebleeds, postnasal drip, rhinorrhea, sinus pressure, sneezing, sore throat, trouble swallowing and voice change.   Eyes: Negative for visual disturbance.  Respiratory: Positive for cough and shortness of breath. Negative for choking.   Cardiovascular: Negative for chest pain and leg swelling.  Gastrointestinal:  Negative for vomiting, abdominal pain and diarrhea.  Genitourinary: Negative for difficulty urinating.  Musculoskeletal: Positive for arthralgias.  Skin: Negative for rash.  Neurological: Negative for tremors, syncope and headaches.  Hematological: Does not bruise/bleed easily.       Objective:   Physical Exam  amb obese wf nad with classic pseudowheeze   Wt Readings from Last 3 Encounters:  03/07/15 241 lb 9.6 oz (109.589 kg)  01/29/15 249 lb (112.946 kg)  10/10/14 262 lb (118.842 kg)    Vital signs reviewed  HEENT: nl dentition, turbinates, and orophanx. Nl external ear canals without cough reflex   NECK :  without JVD/Nodes/TM/ nl carotid upstrokes bilaterally   LUNGS: no acc muscle use, clear to A and P bilaterally without cough on insp or exp maneuvers   CV:  RRR  no s3 or murmur or increase in P2, no edema   ABD:  soft and nontender with nl excursion in the supine position. No bruits or organomegaly, bowel sounds nl  MS:  warm without deformities, calf tenderness, cyanosis or clubbing  SKIN: warm and dry without lesions    NEURO:  alert, approp, no deficits    I personally reviewed images and agree with radiology impression as follows:  CXR:  01/29/15  Hypoinflation accentuates the interstitial lung markings. Increased interstitial prominence reflect the patient's underlying COPD as well as HIV pneumonitis is not excluded however. There is no alveolar pneumonia nor pulmonary mass.  Assessment & Plan:

## 2015-03-21 ENCOUNTER — Ambulatory Visit: Payer: Self-pay | Admitting: Internal Medicine

## 2015-03-24 ENCOUNTER — Telehealth: Payer: Self-pay | Admitting: Infectious Disease

## 2015-03-24 NOTE — Telephone Encounter (Signed)
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 My note indicated we would reconstruct a regimen but there is nothing in EPic that clearly shows what she is on  Is she taking something and is she still in prison?  Looks like she could be on Alsey based on this genotype

## 2015-03-24 NOTE — Telephone Encounter (Signed)
I would also like [place] to call the patient to see what regimen she is currently on--it is not documented in epic in my note and meds no longer show ARVS

## 2015-03-24 NOTE — Telephone Encounter (Signed)
This patient has a follow-up appt with Dr. Johnnye Sima scheduled for 03/26/15 @ 10:45 AM.  I am forwarding your comments to Dr. Johnnye Sima.

## 2015-03-25 NOTE — Telephone Encounter (Addendum)
Per [facility] the pt picked up Tivicay and [place] 03/04/15.  Prior to that in May 2016 she picked up Norivr, Truvada and [place].

## 2015-03-25 NOTE — Telephone Encounter (Signed)
That looks like fine regimen for her, thanks Langley Gauss and Merry Proud

## 2015-03-25 NOTE — Telephone Encounter (Signed)
Will address tomorrow

## 2015-03-26 ENCOUNTER — Telehealth: Payer: Self-pay | Admitting: *Deleted

## 2015-03-26 ENCOUNTER — Ambulatory Visit (INDEPENDENT_AMBULATORY_CARE_PROVIDER_SITE_OTHER): Payer: Medicaid Other | Admitting: Infectious Diseases

## 2015-03-26 VITALS — BP 115/80 | HR 66 | Temp 98.1°F | Wt 236.0 lb

## 2015-03-26 DIAGNOSIS — B182 Chronic viral hepatitis C: Secondary | ICD-10-CM

## 2015-03-26 DIAGNOSIS — J45991 Cough variant asthma: Secondary | ICD-10-CM | POA: Diagnosis not present

## 2015-03-26 DIAGNOSIS — B20 Human immunodeficiency virus [HIV] disease: Secondary | ICD-10-CM

## 2015-03-26 LAB — CBC
HCT: 45 % (ref 36.0–46.0)
Hemoglobin: 15.6 g/dL — ABNORMAL HIGH (ref 12.0–15.0)
MCH: 35.6 pg — ABNORMAL HIGH (ref 26.0–34.0)
MCHC: 34.7 g/dL (ref 30.0–36.0)
MCV: 102.7 fL — ABNORMAL HIGH (ref 78.0–100.0)
MPV: 10.1 fL (ref 8.6–12.4)
Platelets: 219 10*3/uL (ref 150–400)
RBC: 4.38 MIL/uL (ref 3.87–5.11)
RDW: 13.8 % (ref 11.5–15.5)
WBC: 5.6 10*3/uL (ref 4.0–10.5)

## 2015-03-26 LAB — COMPREHENSIVE METABOLIC PANEL
ALT: 27 U/L (ref 0–35)
AST: 25 U/L (ref 0–37)
Albumin: 3.7 g/dL (ref 3.5–5.2)
Alkaline Phosphatase: 73 U/L (ref 39–117)
BUN: 11 mg/dL (ref 6–23)
CO2: 21 mEq/L (ref 19–32)
Calcium: 9.1 mg/dL (ref 8.4–10.5)
Chloride: 103 mEq/L (ref 96–112)
Creat: 0.73 mg/dL (ref 0.50–1.10)
Glucose, Bld: 90 mg/dL (ref 70–99)
Potassium: 4.2 mEq/L (ref 3.5–5.3)
Sodium: 135 mEq/L (ref 135–145)
Total Bilirubin: 0.4 mg/dL (ref 0.2–1.2)
Total Protein: 7.1 g/dL (ref 6.0–8.3)

## 2015-03-26 NOTE — Assessment & Plan Note (Signed)
i asked her to call [facility] regarding rescue med (albuterol). Her breathing improves significantly while in clinic, sitting quietly.

## 2015-03-26 NOTE — Assessment & Plan Note (Signed)
She appears to be doing well on her current ART. Will get her into PAP clinic Will get her mammo scholarship.  Disability form filled out.  Offered/refused condoms.  rtc 6-8 weeks.

## 2015-03-26 NOTE — Telephone Encounter (Signed)
Per Dr Johnnye Sima called the patient about a mammogram he wants her to have. [place] use [place] scholarship that is available and she does not have insurance so we are unable to make an appt at this time for a mammogram. Also advised her case Museum/gallery curator and he advised he will try to find something in her county as well.

## 2015-03-26 NOTE — Assessment & Plan Note (Signed)
Will check her Hep C RNA and CMP today.

## 2015-03-26 NOTE — Addendum Note (Signed)
Addended by: HATCHER, JEFFREY C on: 03/26/2015 11:54 AM   Modules accepted: Orders

## 2015-03-26 NOTE — Progress Notes (Signed)
   Subjective:    Patient ID: Nicole Silva, female    DOB: 12-06-1970, 44 y.o.   MRN: [id number]  HPI  44 yo F with hx of Hep C, HIV 1998, bipolar, COPD.  Has been on harvoni for 3 months. Has decreased appetite. C/o arm and neck pain (L). States pain has been keeping her awake. Starts in her neck.  Has been using non-albuterol inh up to qid. States she does not have albuterol. Has been using her mom's nebulizer for albuterol. This has been helpful.  Has noted small rash on arms and chest.  Down to 3 cigarettes/day.   HIV 1 RNA QUANT (copies/mL)  Date Value  01/29/2015 38*  09/04/2014 <20  04/05/2012 <20   CD4 T CELL ABS  Date Value  01/30/2015 240 /uL*  09/04/2014 210 /uL*  04/05/2012 130 cmm*   Is currently on descovy/DTGV.  No pap-mammo this year.   Review of Systems See HPI.     Objective:   Physical Exam  Constitutional: She appears well-developed and well-nourished.  HENT:  Mouth/Throat: No oropharyngeal exudate.  Eyes: EOM are normal. Pupils are equal, round, and reactive to light.  Neck: Neck supple.  Cardiovascular: Normal rate, regular rhythm and normal heart sounds.   Pulmonary/Chest: Effort normal and breath sounds normal.  Abdominal: Soft. Bowel sounds are normal. She exhibits no distension. There is no tenderness.  Musculoskeletal: She exhibits no edema.  Lymphadenopathy:    She has no cervical adenopathy.      Assessment & Plan:

## 2015-03-27 LAB — HEPATITIS C RNA QUANTITATIVE: HCV Quantitative: NOT DETECTED IU/mL (ref <15)

## 2015-03-31 ENCOUNTER — Ambulatory Visit: Payer: Self-pay | Admitting: Internal Medicine

## 2015-04-02 ENCOUNTER — Encounter: Payer: Self-pay | Admitting: Internal Medicine

## 2015-04-02 ENCOUNTER — Ambulatory Visit (INDEPENDENT_AMBULATORY_CARE_PROVIDER_SITE_OTHER): Payer: Medicaid Other | Admitting: Internal Medicine

## 2015-04-02 VITALS — BP 120/80 | HR 67 | Ht 65.5 in | Wt 237.0 lb

## 2015-04-02 DIAGNOSIS — F17200 Nicotine dependence, unspecified, uncomplicated: Secondary | ICD-10-CM

## 2015-04-02 DIAGNOSIS — J45991 Cough variant asthma: Secondary | ICD-10-CM

## 2015-04-02 DIAGNOSIS — E669 Obesity, unspecified: Secondary | ICD-10-CM

## 2015-04-02 DIAGNOSIS — R0600 Dyspnea, unspecified: Secondary | ICD-10-CM

## 2015-04-02 DIAGNOSIS — Z720 Tobacco use: Secondary | ICD-10-CM | POA: Diagnosis not present

## 2015-04-02 NOTE — Patient Instructions (Signed)
No need for dulera  The key is to stop smoking completely before smoking completely stops you!   Only use your albuterol as a rescue medication to be used if you can't catch your breath by resting or doing a relaxed purse lip breathing pattern.  - The less you use it, the better it will work when you need it. - Ok to use up to    every 4 hours if you must but call for immediate appointment if use goes up over your usual need - Don't leave home without it !!  (think of it like the spare tire for your car)

## 2015-04-02 NOTE — Progress Notes (Signed)
Subjective:    Patient ID: Nicole Silva, female    DOB: [birth year]     MRN: [id number]    Brief patient profile:  44 yowf  HIV pos active smoker "short of breath all her life "  Dating to dx asthma as child With inhalers intil age [age] and needed something ever since and worse than usual since Winter 2016 so referred to pulmonary 03/07/2015 by  Tommy Medal     History of Present Illness  03/07/2015 1st [facility] office visit/ Nicole Silva   Chief Complaint  Patient presents with  . Pulmonary Consult    Referred by Dr. Tommy Medal. Pt states dxed with Asthma as a child. She c/o SOB "always"- worse for the past 6 months. She also c/o cough- prod with yellow sputum.   sleeping on side is ok but never able to sleep on back, cough worse in am with min yellow mucus, no better on qvar and just using saba neb up to 3 x daily prn rec Try dulera 100 Take 2 puffs first thing in am and then another 2 puffs about 12 hours later.  Only use your albuterol nebulizer as a rescue medication   Pepcid 20 ac at bedtime automatically  GERD diet  Please schedule a follow up office visit in 2 weeks, sooner if needed with all meds and neb solutions in hand and she'll set follow up with me     04/02/2015 f/u ov/Perina Salvaggio re: asthma vs vcd/ still smoking / no real change on dulera vs off / did not bring all inhalers/nebs Chief Complaint  Patient presents with  . Follow-up    Pt states her breathing has improved some. Her cough is unchanged. She is using albuterol neb 2-3 x per day on average.     Body mass index is 38.82 kg/(m^2).   Using neb twice daily early in am and qhs  Whereas was qid no change on off/ dulera  Last neb 6-7 h prior to OV    No obvious day to day or daytime variability or assoc cp or chest tightness, subjective wheeze or overt sinus or hb symptoms. No unusual exp hx or h/o childhood pna/ asthma or knowledge of premature birth.  Sleeping flat ok without nocturnal  exacerbation  of  respiratory  c/o's or need for noct saba. Also denies any obvious fluctuation of symptoms with weather or environmental changes or other aggravating or alleviating factors except as outlined above   Current Medications, Allergies, Complete Past Medical History, Past Surgical History, Family History, and Social History were reviewed in [employer] record.  ROS  The following are not active complaints unless bolded sore throat, dysphagia, dental problems, itching, sneezing,  nasal congestion or excess/ purulent secretions, ear ache,   fever, chills, sweats, unintended wt loss, classically pleuritic or exertional cp, hemoptysis,  orthopnea pnd or leg swelling, presyncope, palpitations, abdominal pain, anorexia, nausea, vomiting, diarrhea  or change in bowel or bladder habits, change in stools or urine, dysuria,hematuria,  rash, arthralgias, visual complaints, headache, numbness, weakness or ataxia or problems with walking or coordination,  change in mood/affect or memory.                            Objective:   Physical Exam  amb obese wf nad with classic pseudowheeze   04/02/2015        237  Wt Readings from Last 3 Encounters:  03/07/15  241 lb 9.6 oz (109.589 kg)  01/29/15 249 lb (112.946 kg)  10/10/14 262 lb (118.842 kg)    Vital signs reviewed  HEENT: nl dentition, turbinates, and orophanx. Nl external ear canals without cough reflex   NECK :  without JVD/Nodes/TM/ nl carotid upstrokes bilaterally   LUNGS: no acc muscle use, clear to A and P bilaterally without cough on insp or exp maneuvers   CV:  RRR  no s3 or murmur or increase in P2, no edema   ABD:  soft and nontender with nl excursion in the supine position. No bruits or organomegaly, bowel sounds nl  MS:  warm without deformities, calf tenderness, cyanosis or clubbing  SKIN: warm and dry without lesions    NEURO:  alert, approp, no deficits       I personally reviewed images and  agree with radiology impression as follows:  CXR:  01/29/15  Hypoinflation accentuates the interstitial lung markings. Increased interstitial prominence reflect the patient's underlying COPD as well as HIV pneumonitis is not excluded however. There is no alveolar pneumonia nor pulmonary mass.       Assessment & Plan:

## 2015-04-06 ENCOUNTER — Encounter: Payer: Self-pay | Admitting: Internal Medicine

## 2015-04-06 DIAGNOSIS — E669 Obesity, unspecified: Secondary | ICD-10-CM | POA: Insufficient documentation

## 2015-04-06 NOTE — Assessment & Plan Note (Signed)
  I reviewed the Fletcher curve with the patient that basically indicates  if you quit smoking when your best day FEV1 is still well preserved (as is clearly  the case here)  it is highly unlikely you will progress to severe disease and informed the patient there was no medication on the market that has proven to alter the curve/ its downward trajectory  or the likelihood of progression of their disease.  Therefore stopping smoking and maintaining abstinence is the most important aspect of care, not choice of inhalers or for that matter, doctors.

## 2015-04-06 NOTE — Assessment & Plan Note (Signed)
04/02/2015  Walked RA x 3 laps @ 185 ft each stopped due to End of study, nl pace, no sob or desat   SPIROMETRY 04/02/2015 > almost entirely restrictive changes 7 h since last saba

## 2015-04-06 NOTE — Assessment & Plan Note (Signed)
Body mass index is 38.82 kg/(m^2).  No results found for: TSH   Contributing to gerd tendency/ doe/reviewed need  achieve and maintain neg calorie balance > defer f/u primary care including intermittently monitoring thyroid status

## 2015-04-06 NOTE — Assessment & Plan Note (Signed)
03/07/2015 p extensive coaching HFA effectiveness =    75% try dulera 100 2bid > no better on vs off so ok to leave off 04/02/15 >>> - spirometry 04/02/15 min airflow obst, mid flows only   Not convinced that she has any significant airflow obstruction so ok to leave off dulera and focus on rx for gerd which should include no smoking

## 2015-04-11 ENCOUNTER — Ambulatory Visit (INDEPENDENT_AMBULATORY_CARE_PROVIDER_SITE_OTHER): Payer: Medicaid Other | Admitting: *Deleted

## 2015-04-11 DIAGNOSIS — Z124 Encounter for screening for malignant neoplasm of cervix: Secondary | ICD-10-CM

## 2015-04-11 DIAGNOSIS — Z113 Encounter for screening for infections with a predominantly sexual mode of transmission: Secondary | ICD-10-CM | POA: Diagnosis not present

## 2015-04-11 NOTE — Patient Instructions (Signed)
Your results will be ready in about a week.  I will mail them to you.  Thank you for coming to the Center for your care.  Denise,  RN 

## 2015-04-11 NOTE — Progress Notes (Signed)
  Subjective:     Nicole Silva is a 44 y.o. woman who comes in today for a  pap smear only.  Previous abnormal Pap smears: no. Contraception: condoms.  Objective:    LMP 02/01/2012 Pelvic Exam:  Pap smear obtained.  RN Observed a white area on the cervix from 6 o'clock to 8 o'clock.  Obtained specimen.  Assessment:    Screening pap smear.   Plan:    Follow up in one year, or as indicated by Pap results.  Pt given educational materials re: HIV and women, self-esteem, BSE, nutrition and diet management, PAP smears and partner safety. Pt given condoms.

## 2015-04-14 LAB — CYTOLOGY - PAP

## 2015-04-14 LAB — CERVICOVAGINAL ANCILLARY ONLY
Chlamydia: NEGATIVE
Neisseria Gonorrhea: NEGATIVE

## 2015-04-15 ENCOUNTER — Encounter: Payer: Self-pay | Admitting: *Deleted

## 2015-04-17 ENCOUNTER — Telehealth: Payer: Self-pay | Admitting: Infectious Disease

## 2015-04-17 MED ORDER — EMTRICITABINE-TENOFOVIR AF 200-25 MG PO TABS: 1.0000 | ORAL_TABLET | Freq: Every day | ORAL | Status: DC

## 2015-04-17 MED ORDER — DOLUTEGRAVIR SODIUM 50 MG PO TABS: 50.0000 mg | ORAL_TABLET | Freq: Every day | ORAL | Status: DC

## 2015-04-17 NOTE — Telephone Encounter (Signed)
I received MONOGRAM GENOSURE that showed K101K/Q, K103K/N, V179I, E92E/G conferring EVG resistance  See pictures below  [place]

## 2015-04-29 ENCOUNTER — Encounter: Payer: Self-pay | Admitting: Infectious Disease

## 2015-04-30 ENCOUNTER — Ambulatory Visit (HOSPITAL_COMMUNITY)
Admission: RE | Admit: 2015-04-30 | Discharge: 2015-04-30 | Disposition: A | Discharge status: Home / Self Care | Payer: Medicaid Other | Source: Ambulatory Visit | Attending: Infectious Diseases | Admitting: Infectious Diseases

## 2015-04-30 ENCOUNTER — Other Ambulatory Visit: Payer: Self-pay | Admitting: Infectious Diseases

## 2015-04-30 DIAGNOSIS — N644 Mastodynia: Secondary | ICD-10-CM

## 2015-04-30 DIAGNOSIS — B20 Human immunodeficiency virus [HIV] disease: Secondary | ICD-10-CM

## 2015-05-20 ENCOUNTER — Telehealth: Payer: Self-pay | Admitting: Infectious Diseases

## 2015-05-20 NOTE — Telephone Encounter (Signed)
Breast center started a prior authorization with Medicaid for a diagnostic ultrasound of the breast that was denied due to insufficient clinical information. I called [employer] at [phone] and opened the case and it is in review for 24 hours.

## 2015-05-22 ENCOUNTER — Other Ambulatory Visit: Payer: Medicaid Other

## 2015-05-28 ENCOUNTER — Ambulatory Visit: Payer: Self-pay | Admitting: Infectious Diseases

## 2015-06-25 ENCOUNTER — Ambulatory Visit (INDEPENDENT_AMBULATORY_CARE_PROVIDER_SITE_OTHER): Payer: Medicaid Other | Admitting: Infectious Diseases

## 2015-06-25 ENCOUNTER — Encounter: Payer: Self-pay | Admitting: Infectious Diseases

## 2015-06-25 VITALS — BP 110/66 | HR 89 | Temp 97.6°F | Wt 235.0 lb

## 2015-06-25 DIAGNOSIS — J45991 Cough variant asthma: Secondary | ICD-10-CM

## 2015-06-25 DIAGNOSIS — B182 Chronic viral hepatitis C: Secondary | ICD-10-CM

## 2015-06-25 DIAGNOSIS — Z79899 Other long term (current) drug therapy: Secondary | ICD-10-CM

## 2015-06-25 DIAGNOSIS — Z113 Encounter for screening for infections with a predominantly sexual mode of transmission: Secondary | ICD-10-CM | POA: Diagnosis not present

## 2015-06-25 DIAGNOSIS — B20 Human immunodeficiency virus [HIV] disease: Secondary | ICD-10-CM

## 2015-06-25 DIAGNOSIS — R0789 Other chest pain: Secondary | ICD-10-CM

## 2015-06-25 LAB — CBC
HCT: 37.7 % (ref 36.0–46.0)
Hemoglobin: 13.3 g/dL (ref 12.0–15.0)
MCH: 35.4 pg — ABNORMAL HIGH (ref 26.0–34.0)
MCHC: 35.3 g/dL (ref 30.0–36.0)
MCV: 100.3 fL — ABNORMAL HIGH (ref 78.0–100.0)
MPV: 9.1 fL (ref 8.6–12.4)
Platelets: 222 10*3/uL (ref 150–400)
RBC: 3.76 MIL/uL — ABNORMAL LOW (ref 3.87–5.11)
RDW: 14.7 % (ref 11.5–15.5)
WBC: 8.7 10*3/uL (ref 4.0–10.5)

## 2015-06-25 LAB — COMPREHENSIVE METABOLIC PANEL
ALT: 16 U/L (ref 6–29)
AST: 16 U/L (ref 10–35)
Albumin: 4 g/dL (ref 3.6–5.1)
Alkaline Phosphatase: 72 U/L (ref 33–115)
BUN: 10 mg/dL (ref 7–25)
CO2: 26 mmol/L (ref 20–31)
Calcium: 9.5 mg/dL (ref 8.6–10.2)
Chloride: 100 mmol/L (ref 98–110)
Creat: 0.88 mg/dL (ref 0.50–1.10)
Glucose, Bld: 74 mg/dL (ref 65–99)
Potassium: 4.1 mmol/L (ref 3.5–5.3)
Sodium: 134 mmol/L — ABNORMAL LOW (ref 135–146)
Total Bilirubin: 0.5 mg/dL (ref 0.2–1.2)
Total Protein: 7.2 g/dL (ref 6.1–8.1)

## 2015-06-25 LAB — LIPID PANEL
Cholesterol: 225 mg/dL — ABNORMAL HIGH (ref 125–200)
HDL: 40 mg/dL — ABNORMAL LOW (ref >=46)
LDL Cholesterol: 143 mg/dL — ABNORMAL HIGH (ref <130)
Total CHOL/HDL Ratio: 5.6 Ratio — ABNORMAL HIGH (ref <=5.0)
Triglycerides: 211 mg/dL — ABNORMAL HIGH (ref <150)
VLDL: 42 mg/dL — ABNORMAL HIGH (ref <30)

## 2015-06-25 MED ORDER — TRAMADOL-ACETAMINOPHEN 37.5-325 MG PO TABS: 1.0000 | ORAL_TABLET | Freq: Four times a day (QID) | ORAL | Status: DC | PRN

## 2015-06-25 MED ORDER — TRAMADOL-ACETAMINOPHEN 37.5-325 MG PO TABS: 1.0000 | ORAL_TABLET | Freq: Every day | ORAL | Status: DC | PRN

## 2015-06-25 NOTE — Assessment & Plan Note (Signed)
willl see if we can get her back into pulm, get her local MD in [place].

## 2015-06-25 NOTE — Assessment & Plan Note (Signed)
Trial of tramadol. Once.

## 2015-06-25 NOTE — Assessment & Plan Note (Signed)
Will continue on her current meds Refuses flu shot.  Will check her labs today.  Will see her back in 6 months.

## 2015-06-25 NOTE — Progress Notes (Signed)
   Subjective:    Patient ID: Nicole Silva, female    DOB: August 16, 1971, 44 y.o.   MRN: [id number]  HPI 44 yo F with HIV+, recently treated Hep C, cough variant asthma, obesity. Still smoking,has cut back.  Was in hospital for 2 weeks with pneumonia 3 weeks ago.  Can't find PCP in [place] due to her medicaid.  Has been using inhalers, help some.  Missed harvoni for the lat 2 weeks she was in hospital (they did not have and would not allow her to bring in herself). She has since resumed. Should complete in 8 more days.  Having shoulder and arm pain for last 3 weeks- can't wear a bra due to pain. No rash, bilateral.   HIV 1 RNA QUANT (copies/mL)  Date Value  01/29/2015 38*  09/04/2014 <20  04/05/2012 <20   CD4 T CELL ABS  Date Value  01/30/2015 240 /uL*  09/04/2014 210 /uL*  04/05/2012 130 cmm*     Review of Systems  Constitutional: Negative for fever, chills, appetite change and unexpected weight change.  Respiratory: Positive for cough and shortness of breath.   Cardiovascular: Positive for leg swelling.  Gastrointestinal: Negative for diarrhea and constipation.  Genitourinary: Negative for difficulty urinating.  having stress incontinence. Wearing depends.      Objective:   Physical Exam  Constitutional: She appears well-developed and well-nourished.  HENT:  Mouth/Throat: No oropharyngeal exudate.  Eyes: EOM are normal. Pupils are equal, round, and reactive to light.  Neck: Neck supple.  Cardiovascular: Normal rate, regular rhythm and normal heart sounds.   Pulmonary/Chest: Tachypnea noted. She has wheezes.  Abdominal: Bowel sounds are normal. There is no tenderness.  Musculoskeletal:  Trace edema  Lymphadenopathy:    She has no cervical adenopathy.      Assessment & Plan:

## 2015-06-25 NOTE — Assessment & Plan Note (Signed)
Will check her Hep C VL, hopefully lapse in rx did not lead to resistance.

## 2015-06-26 LAB — T-HELPER CELL (CD4) - (RCID CLINIC ONLY)
CD4 % Helper T Cell: 13 % — ABNORMAL LOW (ref 33–55)
CD4 T Cell Abs: 280 /uL — ABNORMAL LOW (ref 400–2700)

## 2015-06-27 LAB — HIV-1 RNA QUANT-NO REFLEX-BLD
HIV 1 RNA Quant: <20 copies/mL (ref <20)
HIV-1 RNA Quant, Log: <1.3 {Log} (ref <1.30)

## 2015-06-27 LAB — HEPATITIS C RNA QUANTITATIVE: HCV Quantitative: NOT DETECTED IU/mL (ref <15)

## 2015-07-01 ENCOUNTER — Ambulatory Visit: Payer: Medicaid Other | Admitting: Internal Medicine

## 2015-10-10 ENCOUNTER — Ambulatory Visit (INDEPENDENT_AMBULATORY_CARE_PROVIDER_SITE_OTHER): Payer: Medicaid Other | Admitting: Family Medicine

## 2015-10-10 ENCOUNTER — Encounter: Payer: Self-pay | Admitting: Family Medicine

## 2015-10-10 ENCOUNTER — Ambulatory Visit (INDEPENDENT_AMBULATORY_CARE_PROVIDER_SITE_OTHER): Payer: Medicaid Other

## 2015-10-10 VITALS — BP 120/86 | HR 75 | Temp 97.0°F | Ht 65.5 in | Wt 243.6 lb

## 2015-10-10 DIAGNOSIS — F3181 Bipolar II disorder: Secondary | ICD-10-CM | POA: Insufficient documentation

## 2015-10-10 DIAGNOSIS — Z131 Encounter for screening for diabetes mellitus: Secondary | ICD-10-CM

## 2015-10-10 DIAGNOSIS — Z13220 Encounter for screening for lipoid disorders: Secondary | ICD-10-CM

## 2015-10-10 DIAGNOSIS — B372 Candidiasis of skin and nail: Secondary | ICD-10-CM | POA: Diagnosis not present

## 2015-10-10 DIAGNOSIS — N3281 Overactive bladder: Secondary | ICD-10-CM

## 2015-10-10 DIAGNOSIS — S46912A Strain of unspecified muscle, fascia and tendon at shoulder and upper arm level, left arm, initial encounter: Secondary | ICD-10-CM

## 2015-10-10 MED ORDER — MIRABEGRON ER 25 MG PO TB24: 25.0000 mg | ORAL_TABLET | Freq: Every day | ORAL | Status: DC

## 2015-10-10 MED ORDER — NYSTATIN-TRIAMCINOLONE 100000-0.1 UNIT/GM-% EX OINT: 1.0000 "application " | TOPICAL_OINTMENT | Freq: Two times a day (BID) | CUTANEOUS | Status: DC

## 2015-10-10 MED ORDER — MELOXICAM 15 MG PO TABS: 15.0000 mg | ORAL_TABLET | Freq: Every day | ORAL | Status: DC
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
## 2015-10-14 ENCOUNTER — Telehealth: Payer: Self-pay

## 2015-10-14 MED ORDER — SOLIFENACIN SUCCINATE 5 MG PO TABS: 5.0000 mg | ORAL_TABLET | Freq: Every day | ORAL | Status: DC

## 2015-10-14 MED ORDER — CLOTRIMAZOLE-BETAMETHASONE 1-0.05 % EX CREA: 1.0000 "application " | TOPICAL_CREAM | Freq: Two times a day (BID) | CUTANEOUS | Status: DC

## 2015-10-14 NOTE — Telephone Encounter (Signed)
Patient aware.

## 2015-10-14 NOTE — Telephone Encounter (Signed)
Medicaid non preferred  Myrbetriq  Preferred are Oxybutynin syrup,tablets , Toviaz Tablet and Vesicare tablet   Medicaid non preferred Nystatin/Triamcinolone ointment  Preferred are ciclopirox cream, ciclopirox solution, clotrimazole RX cream, clotrimazole-bethamethasone cream, ketoconazole crem, Nyamyc powder, nystatin cream, ointment, powder and Nystop powder

## 2015-10-14 NOTE — Telephone Encounter (Signed)
Sent to different medications to replace the ones that were covered by insurance.

## 2015-10-22 ENCOUNTER — Other Ambulatory Visit: Payer: Medicaid Other

## 2015-10-22 DIAGNOSIS — Z13220 Encounter for screening for lipoid disorders: Secondary | ICD-10-CM

## 2015-10-22 DIAGNOSIS — Z131 Encounter for screening for diabetes mellitus: Secondary | ICD-10-CM

## 2015-10-22 NOTE — Progress Notes (Signed)
Lab only 

## 2015-10-23 ENCOUNTER — Other Ambulatory Visit: Payer: Self-pay | Admitting: *Deleted

## 2015-10-23 LAB — LIPID PANEL
Chol/HDL Ratio: 5.1 ratio units — ABNORMAL HIGH (ref 0.0–4.4)
Cholesterol, Total: 211 mg/dL — ABNORMAL HIGH (ref 100–199)
HDL: 41 mg/dL (ref >39)
LDL Calculated: 130 mg/dL — ABNORMAL HIGH (ref 0–99)
Triglycerides: 202 mg/dL — ABNORMAL HIGH (ref 0–149)
VLDL Cholesterol Cal: 40 mg/dL (ref 5–40)

## 2015-10-23 LAB — CMP14+EGFR
ALT: 16 IU/L (ref 0–32)
AST: 20 IU/L (ref 0–40)
Albumin/Globulin Ratio: 1.3 (ref 1.1–2.5)
Albumin: 4.2 g/dL (ref 3.5–5.5)
Alkaline Phosphatase: 77 IU/L (ref 39–117)
BUN/Creatinine Ratio: 19 (ref 9–23)
BUN: 18 mg/dL (ref 6–24)
Bilirubin Total: 0.4 mg/dL (ref 0.0–1.2)
CO2: 21 mmol/L (ref 18–29)
Calcium: 8.7 mg/dL (ref 8.7–10.2)
Chloride: 98 mmol/L (ref 96–106)
Creatinine, Ser: 0.93 mg/dL (ref 0.57–1.00)
GFR calc Af Amer: 85 mL/min/{1.73_m2} (ref >59)
GFR calc non Af Amer: 74 mL/min/{1.73_m2} (ref >59)
Globulin, Total: 3.3 g/dL (ref 1.5–4.5)
Glucose: 92 mg/dL (ref 65–99)
Potassium: 4.6 mmol/L (ref 3.5–5.2)
Sodium: 137 mmol/L (ref 134–144)
Total Protein: 7.5 g/dL (ref 6.0–8.5)

## 2015-10-23 MED ORDER — ATORVASTATIN CALCIUM 20 MG PO TABS: 20.0000 mg | ORAL_TABLET | Freq: Every day | ORAL | Status: DC

## 2015-10-29 ENCOUNTER — Encounter: Payer: Self-pay | Admitting: Family Medicine

## 2015-10-29 ENCOUNTER — Ambulatory Visit (INDEPENDENT_AMBULATORY_CARE_PROVIDER_SITE_OTHER): Payer: Medicaid Other | Admitting: Family Medicine

## 2015-10-29 VITALS — BP 107/67 | HR 86 | Temp 97.9°F | Ht 65.5 in | Wt 238.8 lb

## 2015-10-29 DIAGNOSIS — N3281 Overactive bladder: Secondary | ICD-10-CM | POA: Diagnosis not present

## 2015-10-29 DIAGNOSIS — M25512 Pain in left shoulder: Secondary | ICD-10-CM

## 2015-10-29 DIAGNOSIS — J439 Emphysema, unspecified: Secondary | ICD-10-CM | POA: Diagnosis not present

## 2015-10-29 MED ORDER — METHYLPREDNISOLONE ACETATE 80 MG/ML IJ SUSP
80.0000 mg | Freq: Once | INTRAMUSCULAR | Status: AC
  Administered 2015-10-29: 80 mg via INTRA_ARTICULAR

## 2015-10-29 MED ORDER — FLUTICASONE FUROATE-VILANTEROL 100-25 MCG/INH IN AEPB: 1.0000 | INHALATION_SPRAY | Freq: Every day | RESPIRATORY_TRACT | Status: DC

## 2015-10-29 NOTE — Assessment & Plan Note (Signed)
Doing much better on more better now, continue current medication

## 2015-10-29 NOTE — Assessment & Plan Note (Signed)
Chronic uncontrolled, patient is still smoking but trying to quit. Will try adding a maintenance inhaler on top of her albuterol.

## 2015-10-29 NOTE — Progress Notes (Signed)
BP 107/67 mmHg  Pulse 86  Temp(Src) 97.9 F (36.6 C) (Oral)  Ht 5' 5.5" (1.664 m)  Wt 238 lb 12.8 oz (108.319 kg)  BMI 39.12 kg/m2  SpO2 97%  LMP 02/01/2012   Subjective:    Patient ID: Nicole Silva, female    DOB: 15-Aug-1971, 45 y.o.   MRN: [id number]  HPI: Nicole Silva is a 45 y.o. female presenting on 10/29/2015 for Overactive bladder; COPD; and Left shoulder pain   HPI COPD recheck Patient is coming today for a COPD recheck. She feels like her breathing has gotten much better than it was. She is still having used her albuterol inhaler twice a day and has no improvement. She denies any fevers or chills. She has had wheezing and shortness of breath on exertion. She has never been on a maintenance before. She currently is smoking about 5-6 cigarettes a day which is down from what she was previously.  Overactive bladder recheck We added Norvasc to her previous regimen last time. She feels like her bladder control is much better and she denies any issues with urinary incontinence that are significant for at this time.  Left shoulder pain Patient was taken to [facility] for her left shoulder pain after fall couple months ago. She feels like she is not getting any benefit from the Mobic and started taking Advil on top of the Mobic. She did have one episode of vaginal bleeding after doing this for a few days but then has none since. Scars from taking both at the same time for her. The pain makes it difficult for arm movement and overhead movement.  Relevant past medical, surgical, family and social history reviewed and updated as indicated. Interim medical history since our last visit reviewed. Allergies and medications reviewed and updated.  Review of Systems  Constitutional: Negative for fever and chills.  HENT: Negative for congestion, ear discharge and ear pain.   Eyes: Negative for redness and visual disturbance.  Respiratory: Negative for chest tightness and shortness of  breath.   Cardiovascular: Negative for chest pain and leg swelling.  Genitourinary: Positive for urgency. Negative for dysuria, hematuria, decreased urine volume and difficulty urinating.  Musculoskeletal: Positive for arthralgias. Negative for back pain, joint swelling and gait problem.  Skin: Negative for rash and wound.  Neurological: Negative for light-headedness and headaches.  Psychiatric/Behavioral: Negative for behavioral problems and agitation.  All other systems reviewed and are negative.  Per HPI unless specifically indicated above    Medication List       This list is accurate as of: 10/29/15  2:03 PM.  Always use your most recent med list.               atorvastatin 20 MG tablet  Commonly known as:  LIPITOR  Take 1 tablet (20 mg total) by mouth daily.     clotrimazole-betamethasone cream  Commonly known as:  LOTRISONE  Apply 1 application topically 2 (two) times daily.     dolutegravir 50 MG tablet  Commonly known as:  TIVICAY  Take 1 tablet (50 mg total) by mouth daily.     emtricitabine-tenofovir AF 200-25 MG tablet  Commonly known as:  DESCOVY  Take 1 tablet by mouth daily.     fluticasone furoate-vilanterol 100-25 MCG/INH Aepb  Commonly known as:  BREO ELLIPTA  Inhale 1 puff into the lungs daily.     ibuprofen 200 MG tablet  Commonly known as:  ADVIL,MOTRIN  Take 800 mg by mouth  every 6 (six) hours as needed. pain     meloxicam 15 MG tablet  Commonly known as:  MOBIC  Take 1 tablet (15 mg total) by mouth daily.     mirabegron ER 25 MG Tb24 tablet  Commonly known as:  MYRBETRIQ  Take 1 tablet (25 mg total) by mouth daily.     nystatin-triamcinolone ointment  Commonly known as:  MYCOLOG  Apply 1 application topically 2 (two) times daily.     PARoxetine 40 MG tablet  Commonly known as:  PAXIL  Take 1 tablet by mouth daily.     QUEtiapine 200 MG tablet  Commonly known as:  SEROQUEL  Take 200 mg by mouth at bedtime. Takes 200 mg at bedtime and  100 mg three times during the day     solifenacin 5 MG tablet  Commonly known as:  VESICARE  Take 1 tablet (5 mg total) by mouth daily.     traZODone 100 MG tablet  Commonly known as:  DESYREL  Take 100 mg by mouth 2 (two) times daily.          Objective:    BP 107/67 mmHg  Pulse 86  Temp(Src) 97.9 F (36.6 C) (Oral)  Ht 5' 5.5" (1.664 m)  Wt 238 lb 12.8 oz (108.319 kg)  BMI 39.12 kg/m2  SpO2 97%  LMP 02/01/2012  Wt Readings from Last 3 Encounters:  10/29/15 238 lb 12.8 oz (108.319 kg)  10/10/15 243 lb 9.6 oz (110.496 kg)  06/25/15 235 lb (106.595 kg)    Physical Exam  Constitutional: She is oriented to person, place, and time. She appears well-developed and well-nourished. No distress.  Eyes: Conjunctivae and EOM are normal. Pupils are equal, round, and reactive to light.  Neck: Neck supple. No thyromegaly present.  Cardiovascular: Normal rate, regular rhythm, normal heart sounds and intact distal pulses.   No murmur heard. Pulmonary/Chest: Effort normal. No respiratory distress. She has wheezes. She has no rales.  Abdominal: Soft. Bowel sounds are normal. She exhibits no distension. There is no tenderness. There is no rebound and no guarding.  Musculoskeletal: Normal range of motion. She exhibits no edema or tenderness.       Left shoulder: She exhibits bony tenderness. She exhibits normal range of motion (Range of motion limited by tenderness but otherwise full), no swelling, no effusion, no crepitus and no deformity.  Lymphadenopathy:    She has no cervical adenopathy.  Neurological: She is alert and oriented to person, place, and time. Coordination normal.  Skin: Skin is warm and dry. No rash noted. She is not diaphoretic. No erythema.  Psychiatric: She has a normal mood and affect. Her behavior is normal.  Nursing note and vitals reviewed.   Results for orders placed or performed in visit on 10/22/15  CMP14+EGFR  Result Value Ref Range   Glucose 92 65 - 99  mg/dL   BUN 18 6 - 24 mg/dL   Creatinine, Ser 0.93 0.57 - 1.00 mg/dL   GFR calc non Af Amer 74 >59 mL/min/1.73   GFR calc Af Amer 85 >59 mL/min/1.73   BUN/Creatinine Ratio 19 9 - 23   Sodium 137 134 - 144 mmol/L   Potassium 4.6 3.5 - 5.2 mmol/L   Chloride 98 96 - 106 mmol/L   CO2 21 18 - 29 mmol/L   Calcium 8.7 8.7 - 10.2 mg/dL   Total Protein 7.5 6.0 - 8.5 g/dL   Albumin 4.2 3.5 - 5.5 g/dL   Globulin, Total 3.3  1.5 - 4.5 g/dL   Albumin/Globulin Ratio 1.3 1.1 - 2.5   Bilirubin Total 0.4 0.0 - 1.2 mg/dL   Alkaline Phosphatase 77 39 - 117 IU/L   AST 20 0 - 40 IU/L   ALT 16 0 - 32 IU/L  Lipid panel  Result Value Ref Range   Cholesterol, Total 211 (H) 100 - 199 mg/dL   Triglycerides 202 (H) 0 - 149 mg/dL   HDL 41 >39 mg/dL   VLDL Cholesterol Cal 40 5 - 40 mg/dL   LDL Calculated 130 (H) 0 - 99 mg/dL   Chol/HDL Ratio 5.1 (H) 0.0 - 4.4 ratio units   Subacromial injection: Risk factors of bleeding and infection discussed with patient and patient is agreeable towards injection. Patient prepped with Betadine. Posterior approach towards injection used. Injected 53m of Depo-Medrol and 1 mL of 2% lidocaine. Patient tolerated procedure well and no side effects from noted. Minimal to no bleeding. Simple bandage applied after.     Assessment & Plan:   Problem List Items Addressed This Visit      Respiratory   COPD (chronic obstructive pulmonary disease) with emphysema ([place] - Primary    Chronic uncontrolled, patient is still smoking but trying to quit. Will try adding a maintenance inhaler on top of her albuterol.      Relevant Medications   methylPREDNISolone acetate (DEPO-MEDROL) injection 80 mg (Start on 10/29/2015  2:15 PM)   fluticasone furoate-vilanterol (BREO ELLIPTA) 100-25 MCG/INH AEPB     Genitourinary   OAB (overactive bladder)    Doing much better on more better now, continue current medication       Other Visit Diagnoses    Left shoulder pain        X-ray was normal,  will try steroid injection if not improved then consider MRI    Relevant Medications    methylPREDNISolone acetate (DEPO-MEDROL) injection 80 mg (Start on 10/29/2015  2:15 PM)       Follow up plan: Return in about 4 weeks (around 11/26/2015), or if symptoms worsen or fail to improve, for Recheck breathing and COPD.  Counseling provided for all of the vaccine components No orders of the defined types were placed in this encounter.    JCaryl Pina MD [facility] 10/29/2015, 2:03 PM

## 2015-10-30 ENCOUNTER — Telehealth: Payer: Self-pay

## 2015-10-30 MED ORDER — BUDESONIDE-FORMOTEROL FUMARATE 80-4.5 MCG/ACT IN AERO: 2.0000 | INHALATION_SPRAY | Freq: Two times a day (BID) | RESPIRATORY_TRACT | Status: DC

## 2015-10-30 NOTE — Telephone Encounter (Signed)
Patient aware.

## 2015-10-30 NOTE — Telephone Encounter (Signed)
Medicaid non preferred Breo    Preferred are Advair diskus/HFA inhaler  Dulera Inhaler and Symbicort inhaler

## 2015-11-04 ENCOUNTER — Telehealth: Payer: Self-pay | Admitting: Family Medicine

## 2015-11-04 MED ORDER — FLUTICASONE-SALMETEROL 100-50 MCG/DOSE IN AEPB: 1.0000 | INHALATION_SPRAY | Freq: Two times a day (BID) | RESPIRATORY_TRACT | Status: DC

## 2015-11-04 NOTE — Telephone Encounter (Signed)
Advair Prescription sent to pharmacy. Pt needs to make sure she is not taking Breo and Symbicort.

## 2015-11-04 NOTE — Telephone Encounter (Signed)
Patient aware and verbalizes understanding that she only needs to use the advair.

## 2015-11-04 NOTE — Addendum Note (Signed)
Addended by: Evelina Dun A on: 11/04/2015 09:32 AM   Modules accepted: Orders

## 2015-11-04 NOTE — Telephone Encounter (Signed)
Needs Rx for Advair, [facility] is not covered

## 2015-11-06 ENCOUNTER — Telehealth: Payer: Self-pay

## 2015-11-06 NOTE — Telephone Encounter (Signed)
Medicaid prior authorized Advair x 1 year   [id number]

## 2015-11-26 ENCOUNTER — Ambulatory Visit: Payer: Medicaid Other | Admitting: Family Medicine

## 2015-11-27 ENCOUNTER — Encounter: Payer: Self-pay | Admitting: Family Medicine

## 2015-12-01 ENCOUNTER — Ambulatory Visit: Payer: Medicaid Other | Admitting: Infectious Diseases

## 2015-12-08 ENCOUNTER — Ambulatory Visit: Payer: Medicaid Other | Admitting: Family Medicine

## 2015-12-10 ENCOUNTER — Ambulatory Visit (INDEPENDENT_AMBULATORY_CARE_PROVIDER_SITE_OTHER): Payer: Medicaid Other | Admitting: Family Medicine

## 2015-12-10 ENCOUNTER — Encounter: Payer: Self-pay | Admitting: Family Medicine

## 2015-12-10 VITALS — BP 112/72 | HR 84 | Temp 97.4°F | Ht 65.5 in | Wt 249.8 lb

## 2015-12-10 DIAGNOSIS — M25512 Pain in left shoulder: Secondary | ICD-10-CM

## 2015-12-10 DIAGNOSIS — J439 Emphysema, unspecified: Secondary | ICD-10-CM

## 2015-12-10 MED ORDER — MELOXICAM 15 MG PO TABS: 15.0000 mg | ORAL_TABLET | Freq: Every day | ORAL | Status: DC

## 2015-12-10 MED ORDER — PREDNISONE 20 MG PO TABS: ORAL_TABLET | ORAL | Status: DC

## 2015-12-10 MED ORDER — CYCLOBENZAPRINE HCL 10 MG PO TABS: 10.0000 mg | ORAL_TABLET | Freq: Three times a day (TID) | ORAL | Status: DC | PRN

## 2015-12-10 NOTE — Progress Notes (Signed)
BP 112/72 mmHg  Pulse 84  Temp(Src) 97.4 F (36.3 C)  Ht 5' 5.5" (1.664 m)  Wt 249 lb 12.8 oz (113.309 kg)  BMI 40.92 kg/m2  LMP 02/01/2012   Subjective:    Patient ID: Nicole Silva, female    DOB: 03/04/1971, 45 y.o.   MRN: [id number]  HPI: Nicole Silva is a 45 y.o. female presenting on 12/10/2015 for COPD and Left shoulder pain   HPI COPD recheck Patient never got her maintenance inhaler and is still having a lot of wheezing and congestion and shortness of breath. She is still using her albuterol inhaler multiple times a day. She says her insurance didn't cover and we did have to go through prior off and tried 2 different medications before again when approved. It looks like one was approved just this week.  Left shoulder pain Patient continues to have left shoulder pain that is unchanged from what it was previously. She still will not let me move the shoulder very much to assess fully what's going on because she says the pain is 2 much. The pain is 8 out of 10 when she doesn't remove it and 10 out of 10 or more when she does move it at all. She denies any fevers or chills or overlying swelling or erythema. She does feel a grinding in the shoulder. We had an appointment with orthopedics and she missed that appointment. She says anti-inflammatories and muscle relaxers don't work for her.  Relevant past medical, surgical, family and social history reviewed and updated as indicated. Interim medical history since our last visit reviewed. Allergies and medications reviewed and updated.  Review of Systems  Constitutional: Negative for fever and chills.  HENT: Positive for congestion, rhinorrhea, sinus pressure and sneezing. Negative for ear discharge and ear pain.   Eyes: Negative for redness and visual disturbance.  Respiratory: Positive for cough, shortness of breath and wheezing. Negative for chest tightness.   Cardiovascular: Negative for chest pain and leg swelling.    Genitourinary: Negative for dysuria, urgency, hematuria, decreased urine volume and difficulty urinating.  Musculoskeletal: Positive for arthralgias. Negative for back pain, joint swelling and gait problem.  Skin: Negative for rash and wound.  Neurological: Negative for light-headedness and headaches.  Psychiatric/Behavioral: Negative for behavioral problems and agitation.  All other systems reviewed and are negative.   Per HPI unless specifically indicated above     Medication List       This list is accurate as of: 12/10/15  2:30 PM.  Always use your most recent med list.               atorvastatin 20 MG tablet  Commonly known as:  LIPITOR  Take 1 tablet (20 mg total) by mouth daily.     clotrimazole-betamethasone cream  Commonly known as:  LOTRISONE  Apply 1 application topically 2 (two) times daily.     cyclobenzaprine 10 MG tablet  Commonly known as:  FLEXERIL  Take 1 tablet (10 mg total) by mouth 3 (three) times daily as needed for muscle spasms.     dolutegravir 50 MG tablet  Commonly known as:  TIVICAY  Take 1 tablet (50 mg total) by mouth daily.     emtricitabine-tenofovir AF 200-25 MG tablet  Commonly known as:  DESCOVY  Take 1 tablet by mouth daily.     Fluticasone-Salmeterol 100-50 MCG/DOSE Aepb  Commonly known as:  ADVAIR  Inhale 1 puff into the lungs 2 (two) times daily.  meloxicam 15 MG tablet  Commonly known as:  MOBIC  Take 1 tablet (15 mg total) by mouth daily.     mirabegron ER 25 MG Tb24 tablet  Commonly known as:  MYRBETRIQ  Take 1 tablet (25 mg total) by mouth daily.     nystatin-triamcinolone ointment  Commonly known as:  MYCOLOG  Apply 1 application topically 2 (two) times daily.     PARoxetine 40 MG tablet  Commonly known as:  PAXIL  Take 1 tablet by mouth daily.     QUEtiapine 200 MG tablet  Commonly known as:  SEROQUEL  Take 200 mg by mouth at bedtime. Takes 200 mg at bedtime and 100 mg three times during the day      solifenacin 5 MG tablet  Commonly known as:  VESICARE  Take 1 tablet (5 mg total) by mouth daily.     traZODone 100 MG tablet  Commonly known as:  DESYREL  Take 100 mg by mouth 2 (two) times daily.           Objective:    BP 112/72 mmHg  Pulse 84  Temp(Src) 97.4 F (36.3 C)  Ht 5' 5.5" (1.664 m)  Wt 249 lb 12.8 oz (113.309 kg)  BMI 40.92 kg/m2  LMP 02/01/2012  Wt Readings from Last 3 Encounters:  12/10/15 249 lb 12.8 oz (113.309 kg)  10/29/15 238 lb 12.8 oz (108.319 kg)  10/10/15 243 lb 9.6 oz (110.496 kg)    Physical Exam  Constitutional: She is oriented to person, place, and time. She appears well-developed and well-nourished. No distress.  Eyes: Conjunctivae and EOM are normal. Pupils are equal, round, and reactive to light.  Neck: Neck supple. No thyromegaly present.  Cardiovascular: Normal rate, regular rhythm, normal heart sounds and intact distal pulses.   No murmur heard. Pulmonary/Chest: Effort normal and breath sounds normal. No respiratory distress. She has no wheezes. She has no rales.  Abdominal: Soft. Bowel sounds are normal. She exhibits no distension. There is no tenderness. There is no rebound and no guarding.  Musculoskeletal: She exhibits no edema or tenderness.       Left shoulder: She exhibits decreased range of motion (Range of motion limited by tenderness and patient will not let me move it) and bony tenderness. She exhibits no swelling, no effusion, no crepitus and no deformity.  Lymphadenopathy:    She has no cervical adenopathy.  Neurological: She is alert and oriented to person, place, and time. Coordination normal.  Skin: Skin is warm and dry. No rash noted. She is not diaphoretic. No erythema.  Psychiatric: She has a normal mood and affect. Her behavior is normal.  Nursing note and vitals reviewed.     Assessment & Plan:   Problem List Items Addressed This Visit      Respiratory   COPD (chronic obstructive pulmonary disease) with  emphysema ([place]) - Primary    Did not get a steroid inhaler yet. Looks like because of insurance we've gone through [place] and [facility] and have arrived Advair and it looks like Advair was approved for a year. We call pharmacy and it is over there and she will go pick it up. It shouldn't cause her more than $3. She has been using her rescue inhaler multiple times a day.      Relevant Medications   predniSONE (DELTASONE) 20 MG tablet    Other Visit Diagnoses    Pain in joint of left shoulder  Patient has appointment with orthopedic but he got pushed out since she missed her last one. We'll refill muscle relaxer and NSAID    Relevant Medications    meloxicam (MOBIC) 15 MG tablet    cyclobenzaprine (FLEXERIL) 10 MG tablet        Follow up plan: Return in about 4 weeks (around 01/07/2016), or if symptoms worsen or fail to improve, for COPD recheck.  Counseling provided for all of the vaccine components No orders of the defined types were placed in this encounter.    Caryl Pina, MD [facility] 12/10/2015, 2:30 PM

## 2015-12-10 NOTE — Assessment & Plan Note (Signed)
Did not get a steroid inhaler yet. Looks like because of insurance we've gone through [place] and [facility] and have arrived Advair and it looks like Advair was approved for a year. We call pharmacy and it is over there and she will go pick it up. It shouldn't cause her more than $3. She has been using her rescue inhaler multiple times a day.

## 2015-12-25 ENCOUNTER — Telehealth: Payer: Self-pay | Admitting: Family Medicine

## 2015-12-25 NOTE — Telephone Encounter (Signed)
Can not find a fax  Called and they will fax again

## 2015-12-31 ENCOUNTER — Telehealth: Payer: Self-pay | Admitting: Family Medicine

## 2015-12-31 NOTE — Telephone Encounter (Signed)
Yes,came today, pt aware

## 2016-01-01 ENCOUNTER — Telehealth: Payer: Self-pay | Admitting: Family Medicine

## 2016-01-01 NOTE — Telephone Encounter (Signed)
Form faxed this am. 

## 2016-01-06 ENCOUNTER — Telehealth: Payer: Self-pay | Admitting: Family Medicine

## 2016-01-07 ENCOUNTER — Ambulatory Visit: Payer: Medicaid Other | Admitting: Family Medicine

## 2016-01-07 NOTE — Telephone Encounter (Signed)
re-faxed

## 2016-01-08 ENCOUNTER — Encounter: Payer: Self-pay | Admitting: Family Medicine

## 2016-01-08 ENCOUNTER — Ambulatory Visit (INDEPENDENT_AMBULATORY_CARE_PROVIDER_SITE_OTHER): Payer: Medicaid Other | Admitting: Family Medicine

## 2016-01-08 ENCOUNTER — Encounter (INDEPENDENT_AMBULATORY_CARE_PROVIDER_SITE_OTHER): Payer: Self-pay

## 2016-01-08 VITALS — BP 125/81 | HR 81 | Temp 97.7°F | Ht 65.5 in | Wt 241.4 lb

## 2016-01-08 DIAGNOSIS — J439 Emphysema, unspecified: Secondary | ICD-10-CM

## 2016-01-08 DIAGNOSIS — H539 Unspecified visual disturbance: Secondary | ICD-10-CM

## 2016-01-08 DIAGNOSIS — M25512 Pain in left shoulder: Secondary | ICD-10-CM | POA: Diagnosis not present

## 2016-01-08 MED ORDER — FLUTICASONE PROPIONATE 50 MCG/ACT NA SUSP: 1.0000 | Freq: Two times a day (BID) | NASAL | Status: DC | PRN

## 2016-01-08 MED ORDER — CYCLOBENZAPRINE HCL 10 MG PO TABS: 10.0000 mg | ORAL_TABLET | Freq: Three times a day (TID) | ORAL | Status: DC | PRN

## 2016-01-08 NOTE — Assessment & Plan Note (Signed)
Her breathing is doing much better now that she is finally on the Advair. She is only have to use her rescue inhaler once or twice a week. She does have some nighttime postnasal drainage and recommended Flonase and an antihistamine to help with that.

## 2016-01-08 NOTE — Progress Notes (Signed)
BP 125/81 mmHg  Pulse 81  Temp(Src) 97.7 F (36.5 C) (Oral)  Ht 5' 5.5" (1.664 m)  Wt 241 lb 6.4 oz (109.498 kg)  BMI 39.55 kg/m2  LMP 02/01/2012   Subjective:    Patient ID: Nicole Silva, female    DOB: 1971/03/24, 45 y.o.   MRN: [id number]  HPI: Nicole Silva is a 45 y.o. female presenting on 01/08/2016 for Follow-up   HPI COPD recheck Patient is coming in today for a COPD recheck. She finally did get her Advair after we worked with her insurance to get it. She is doing a lot better on her Advair. She is only needing to use her rescue albuterol inhaler 1-2 times per week compared to the 2-3 times a day that she was using previously. She is still smoking but she says she is down to half a pack a day. She denies any shortness of breath or wheezing currently and has been doing quite well for the past 3 days. She does still get some postnasal drainage at nighttime which makes her feel congested and sometimes causes her to cough. She denies any fevers or chills or shortness of breath.  Visual disturbance Patient is coming in today also complaining of gradually worsening vision over the past few years. She feels like she needs to get her eyes checked out and see if she needs glasses versus having anything else wrong with her eyes. This is been a gradual change and is bilateral.  Pain in left shoulder Patient is having pain in left shoulder still but is much improved from what she was previously. She has been using the Flexeril and it has been doing real well for her now. She is gaining more range of motion in that shoulder.  Relevant past medical, surgical, family and social history reviewed and updated as indicated. Interim medical history since our last visit reviewed. Allergies and medications reviewed and updated.  Review of Systems  Constitutional: Negative for fever and chills.  HENT: Positive for congestion. Negative for ear discharge and ear pain.   Eyes: Positive for  visual disturbance. Negative for photophobia, pain and redness.  Respiratory: Positive for cough and wheezing. Negative for chest tightness and shortness of breath.   Cardiovascular: Negative for chest pain and leg swelling.  Genitourinary: Negative for dysuria and difficulty urinating.  Musculoskeletal: Positive for arthralgias. Negative for back pain and gait problem.  Skin: Negative for rash.  Neurological: Negative for light-headedness and headaches.  Psychiatric/Behavioral: Negative for behavioral problems, dysphoric mood, decreased concentration and agitation.  All other systems reviewed and are negative.   Per HPI unless specifically indicated above     Medication List       This list is accurate as of: 01/08/16  2:46 PM.  Always use your most recent med list.               atorvastatin 20 MG tablet  Commonly known as:  LIPITOR  Take 1 tablet (20 mg total) by mouth daily.     clotrimazole-betamethasone cream  Commonly known as:  LOTRISONE  Apply 1 application topically 2 (two) times daily.     cyclobenzaprine 10 MG tablet  Commonly known as:  FLEXERIL  Take 1 tablet (10 mg total) by mouth 3 (three) times daily as needed for muscle spasms.     dolutegravir 50 MG tablet  Commonly known as:  TIVICAY  Take 1 tablet (50 mg total) by mouth daily.     emtricitabine-tenofovir  AF 200-25 MG tablet  Commonly known as:  DESCOVY  Take 1 tablet by mouth daily.     fluticasone 50 MCG/ACT nasal spray  Commonly known as:  FLONASE  Place 1 spray into both nostrils 2 (two) times daily as needed for allergies or rhinitis.     Fluticasone-Salmeterol 100-50 MCG/DOSE Aepb  Commonly known as:  ADVAIR  Inhale 1 puff into the lungs 2 (two) times daily.     meloxicam 15 MG tablet  Commonly known as:  MOBIC  Take 1 tablet (15 mg total) by mouth daily.     mirabegron ER 25 MG Tb24 tablet  Commonly known as:  MYRBETRIQ  Take 1 tablet (25 mg total) by mouth daily.      nystatin-triamcinolone ointment  Commonly known as:  MYCOLOG  Apply 1 application topically 2 (two) times daily.     PARoxetine 40 MG tablet  Commonly known as:  PAXIL  Take 1 tablet by mouth daily.     predniSONE 20 MG tablet  Commonly known as:  DELTASONE  2 po at same time daily for 5 days     QUEtiapine 200 MG tablet  Commonly known as:  SEROQUEL  Take 200 mg by mouth at bedtime. Takes 200 mg at bedtime and 100 mg three times during the day     solifenacin 5 MG tablet  Commonly known as:  VESICARE  Take 1 tablet (5 mg total) by mouth daily.     traZODone 100 MG tablet  Commonly known as:  DESYREL  Take 100 mg by mouth 2 (two) times daily.           Objective:    BP 125/81 mmHg  Pulse 81  Temp(Src) 97.7 F (36.5 C) (Oral)  Ht 5' 5.5" (1.664 m)  Wt 241 lb 6.4 oz (109.498 kg)  BMI 39.55 kg/m2  LMP 02/01/2012  Wt Readings from Last 3 Encounters:  01/08/16 241 lb 6.4 oz (109.498 kg)  12/10/15 249 lb 12.8 oz (113.309 kg)  10/29/15 238 lb 12.8 oz (108.319 kg)    Physical Exam  Constitutional: She is oriented to person, place, and time. She appears well-developed and well-nourished. No distress.  Eyes: Conjunctivae and EOM are normal. Pupils are equal, round, and reactive to light. Right eye exhibits no discharge. Left eye exhibits no discharge. No scleral icterus.  Neck: Neck supple. No thyromegaly present.  Cardiovascular: Normal rate, regular rhythm, normal heart sounds and intact distal pulses.   No murmur heard. Pulmonary/Chest: Effort normal and breath sounds normal. No respiratory distress. She has no wheezes.  Musculoskeletal: Normal range of motion. She exhibits tenderness (Continued left shoulder tenderness but improved from previous, worse with range of motion.). She exhibits no edema.  Lymphadenopathy:    She has no cervical adenopathy.  Neurological: She is alert and oriented to person, place, and time. Coordination normal.  Skin: Skin is warm and dry.  No rash noted. She is not diaphoretic.  Psychiatric: She has a normal mood and affect. Her behavior is normal.  Nursing note and vitals reviewed.   Results for orders placed or performed in visit on 10/22/15  CMP14+EGFR  Result Value Ref Range   Glucose 92 65 - 99 mg/dL   BUN 18 6 - 24 mg/dL   Creatinine, Ser 0.93 0.57 - 1.00 mg/dL   GFR calc non Af Amer 74 >59 mL/min/1.73   GFR calc Af Amer 85 >59 mL/min/1.73   BUN/Creatinine Ratio 19 9 - 23   Sodium  137 134 - 144 mmol/L   Potassium 4.6 3.5 - 5.2 mmol/L   Chloride 98 96 - 106 mmol/L   CO2 21 18 - 29 mmol/L   Calcium 8.7 8.7 - 10.2 mg/dL   Total Protein 7.5 6.0 - 8.5 g/dL   Albumin 4.2 3.5 - 5.5 g/dL   Globulin, Total 3.3 1.5 - 4.5 g/dL   Albumin/Globulin Ratio 1.3 1.1 - 2.5   Bilirubin Total 0.4 0.0 - 1.2 mg/dL   Alkaline Phosphatase 77 39 - 117 IU/L   AST 20 0 - 40 IU/L   ALT 16 0 - 32 IU/L  Lipid panel  Result Value Ref Range   Cholesterol, Total 211 (H) 100 - 199 mg/dL   Triglycerides 202 (H) 0 - 149 mg/dL   HDL 41 >39 mg/dL   VLDL Cholesterol Cal 40 5 - 40 mg/dL   LDL Calculated 130 (H) 0 - 99 mg/dL   Chol/HDL Ratio 5.1 (H) 0.0 - 4.4 ratio units      Assessment & Plan:       Problem List Items Addressed This Visit      Respiratory   COPD (chronic obstructive pulmonary disease) with emphysema ([place]) - Primary    Her breathing is doing much better now that she is finally on the Advair. She is only have to use her rescue inhaler once or twice a week. She does have some nighttime postnasal drainage and recommended Flonase and an antihistamine to help with that.      Relevant Medications   fluticasone (FLONASE) 50 MCG/ACT nasal spray    Other Visit Diagnoses    Visual disturbance        Gradually worsening visual disturbance, we'll like to go see an eye doctor for an exam.    Relevant Orders    Ambulatory referral to Ophthalmology    Pain in joint of left shoulder        Patient has appointment with orthopedic  but he got pushed out since she missed her last one. We'll refill muscle relaxer and NSAID    Relevant Medications    cyclobenzaprine (FLEXERIL) 10 MG tablet        Follow up plan: Return in about 3 months (around 04/08/2016), or if symptoms worsen or fail to improve, for COPD recheck.  Counseling provided for all of the vaccine components Orders Placed This Encounter  Procedures  . Ambulatory referral to Ophthalmology    Caryl Pina, MD [facility] 01/08/2016, 2:46 PM

## 2016-01-20 ENCOUNTER — Other Ambulatory Visit: Payer: Self-pay | Admitting: Family Medicine

## 2016-01-27 ENCOUNTER — Other Ambulatory Visit: Payer: Self-pay | Admitting: Family Medicine

## 2016-01-27 DIAGNOSIS — M25512 Pain in left shoulder: Secondary | ICD-10-CM

## 2016-01-27 MED ORDER — CYCLOBENZAPRINE HCL 10 MG PO TABS: 10.0000 mg | ORAL_TABLET | Freq: Three times a day (TID) | ORAL | Status: DC | PRN

## 2016-02-04 ENCOUNTER — Ambulatory Visit: Payer: Self-pay | Admitting: Infectious Diseases

## 2016-02-27 ENCOUNTER — Ambulatory Visit: Payer: Self-pay | Admitting: Infectious Diseases

## 2016-04-05 ENCOUNTER — Ambulatory Visit: Payer: Self-pay | Admitting: *Deleted

## 2016-04-05 ENCOUNTER — Ambulatory Visit (INDEPENDENT_AMBULATORY_CARE_PROVIDER_SITE_OTHER): Payer: Self-pay | Admitting: Infectious Diseases

## 2016-04-05 VITALS — BP 128/85 | HR 86 | Temp 98.8°F | Wt 251.0 lb

## 2016-04-05 DIAGNOSIS — F1910 Other psychoactive substance abuse, uncomplicated: Secondary | ICD-10-CM

## 2016-04-05 DIAGNOSIS — J439 Emphysema, unspecified: Secondary | ICD-10-CM

## 2016-04-05 DIAGNOSIS — Z79899 Other long term (current) drug therapy: Secondary | ICD-10-CM

## 2016-04-05 DIAGNOSIS — Z113 Encounter for screening for infections with a predominantly sexual mode of transmission: Secondary | ICD-10-CM

## 2016-04-05 DIAGNOSIS — B20 Human immunodeficiency virus [HIV] disease: Secondary | ICD-10-CM

## 2016-04-05 NOTE — BH Specialist Note (Signed)
Counselor met with Nicole Silva in the exam room for a warm hand off.  Patient was oriented times four with good affect.  Patient was alert and talkative.  Patient was soft spoken and did not have a lot to say.  Patient was cooperative and answered all questions posed by counselor.  Patient shared that she was busy right now with the care of her mother of which whom she lives with.  Patient indicated that she would make an appointment with counselor if she ever had a need. Counselor provided support and encouragement accordingly.   Rolena Infante, MA, LPC [facility]

## 2016-04-05 NOTE — Progress Notes (Signed)
   Subjective:    Patient ID: Nicole Silva, female    DOB: 1971-04-05, 45 y.o.   MRN: [id number]  HPI 45 yo F with HIV+, treated Hep C (VL - 06-2015), cough variant asthma, obesity. Still smoking, has cut back. Doing well with Desc/DTGv.  Breathing has been better.  Has been drinking 16 oz water/day.   HIV 1 RNA Quant (copies/mL)  Date Value  06/25/2015 <20  01/29/2015 38 (H)  09/04/2014 <20   CD4 T Cell Abs (/uL)  Date Value  06/25/2015 280 (L)  01/30/2015 240 (L)  09/04/2014 210 (L)   Gets pap via PCP. Has not had mammo.   Review of Systems  Constitutional: Negative for appetite change and unexpected weight change.  Respiratory: Negative for shortness of breath.   Gastrointestinal: Negative for constipation and diarrhea.  Genitourinary: Negative for difficulty urinating.  has been having issues with pain in her L arm and foot after fall. Has ortho f/u. Would like pain rx until then.      Objective:   Physical Exam  Constitutional: She appears well-developed and well-nourished.  HENT:  Mouth/Throat: No oropharyngeal exudate.  Eyes: EOM are normal. Pupils are equal, round, and reactive to light.  Neck: Neck supple.  Cardiovascular: Normal rate, regular rhythm and normal heart sounds.   Pulmonary/Chest: Effort normal and breath sounds normal.  Abdominal: Soft. Bowel sounds are normal. There is no tenderness. There is no rebound.  Musculoskeletal: She exhibits tenderness. She exhibits no edema.  Lymphadenopathy:    She has no cervical adenopathy.  tenderness at L ankle, L shoulder, no bony defect palpated.      Assessment & Plan:

## 2016-04-05 NOTE — Assessment & Plan Note (Signed)
She is doing well Will get her mammo scholarship Offered/refused condoms.  Will see her back in 6 months

## 2016-04-05 NOTE — Assessment & Plan Note (Signed)
She is encouraged to quit smoking.  She will f/u with PCP

## 2016-04-05 NOTE — Assessment & Plan Note (Signed)
She asks for percocet today, will let her f/u with her prev presciber.  Will give her 10 tramadol.

## 2016-04-19 ENCOUNTER — Other Ambulatory Visit: Payer: Self-pay | Admitting: Infectious Disease

## 2016-05-18 ENCOUNTER — Other Ambulatory Visit: Payer: Self-pay | Admitting: Infectious Diseases

## 2016-06-07 ENCOUNTER — Ambulatory Visit: Payer: Medicaid Other | Admitting: Family Medicine

## 2016-06-11 ENCOUNTER — Ambulatory Visit (INDEPENDENT_AMBULATORY_CARE_PROVIDER_SITE_OTHER): Payer: Medicaid Other | Admitting: Family Medicine

## 2016-06-11 ENCOUNTER — Other Ambulatory Visit: Payer: Self-pay | Admitting: *Deleted

## 2016-06-11 ENCOUNTER — Encounter: Payer: Self-pay | Admitting: Family Medicine

## 2016-06-11 VITALS — BP 125/79 | HR 95 | Temp 98.1°F | Ht 65.5 in | Wt 240.2 lb

## 2016-06-11 DIAGNOSIS — S161XXA Strain of muscle, fascia and tendon at neck level, initial encounter: Secondary | ICD-10-CM

## 2016-06-11 DIAGNOSIS — R2689 Other abnormalities of gait and mobility: Secondary | ICD-10-CM

## 2016-06-11 DIAGNOSIS — H539 Unspecified visual disturbance: Secondary | ICD-10-CM | POA: Diagnosis not present

## 2016-06-11 DIAGNOSIS — H43393 Other vitreous opacities, bilateral: Secondary | ICD-10-CM

## 2016-06-11 DIAGNOSIS — R29818 Other symptoms and signs involving the nervous system: Secondary | ICD-10-CM | POA: Diagnosis not present

## 2016-06-11 MED ORDER — CYCLOBENZAPRINE HCL 10 MG PO TABS
10.0000 mg | ORAL_TABLET | Freq: Three times a day (TID) | ORAL | 1 refills | Status: DC | PRN
Start: 2016-06-11 — End: 2016-09-10

## 2016-06-11 MED ORDER — CYCLOBENZAPRINE HCL 10 MG PO TABS: 10.0000 mg | ORAL_TABLET | Freq: Three times a day (TID) | ORAL | 1 refills | Status: DC | PRN

## 2016-06-11 NOTE — Progress Notes (Signed)
BP 125/79 (Cuff Size: Large)   Pulse 95   Temp 98.1 F (36.7 C) (Oral)   Ht 5' 5.5" (1.664 m)   Wt 240 lb 4 oz (109 kg)   LMP 02/01/2012   BMI 39.37 kg/m    Subjective:    Patient ID: Nicole Silva, female    DOB: 1970/09/19, 45 y.o.   MRN: [id number]  HPI: Nicole Silva is a 45 y.o. female presenting on 06/11/2016 for Visual changes (seeing floaters, black spots in eyes) and Falling   HPI Visual disturbance and floaters and balance issues Patient comes in today with visual disturbance and seeing floaters in her eyes that has been going on for the past 2 months but increasing. She's been seeing black spots and then she's been feeling off balance. She denies any eye pain or ear pain or congestion or sinus pressure. She feels like she is off balance and leaning towards the left when she walks. She denies any spinning sensation or any feeling of lightheadedness. She has not seen an ophthalmologist for quite a few months and has not seen one since these issues started.  Neck muscle pain Patient has been having some neck muscle soreness because of her left shoulder pain that she has chronically and she feels like she is having to use the right side a lot more which is been causing the soreness in her neck. She wants to know if she can get a Flexeril refill.  Relevant past medical, surgical, family and social history reviewed and updated as indicated. Interim medical history since our last visit reviewed. Allergies and medications reviewed and updated.  Review of Systems  Constitutional: Negative for chills and fever.  HENT: Negative for congestion, ear discharge and ear pain.   Eyes: Positive for visual disturbance. Negative for photophobia and redness.  Respiratory: Negative for chest tightness and shortness of breath.   Cardiovascular: Negative for chest pain and leg swelling.  Genitourinary: Negative for difficulty urinating and dysuria.  Musculoskeletal: Positive for neck  pain and neck stiffness. Negative for back pain and gait problem.  Skin: Negative for color change and rash.  Neurological: Negative for dizziness, weakness, light-headedness, numbness and headaches.  Psychiatric/Behavioral: Negative for agitation and behavioral problems.  All other systems reviewed and are negative.   Per HPI unless specifically indicated above     Medication List       Accurate as of 06/11/16  1:47 PM. Always use your most recent med list.          atorvastatin 20 MG tablet Commonly known as:  LIPITOR Take 1 tablet (20 mg total) by mouth daily.   clotrimazole-betamethasone cream Commonly known as:  LOTRISONE Apply 1 application topically 2 (two) times daily.   cyclobenzaprine 10 MG tablet Commonly known as:  FLEXERIL Take 1 tablet (10 mg total) by mouth 3 (three) times daily as needed for muscle spasms.   DESCOVY 200-25 MG tablet Generic drug:  emtricitabine-tenofovir AF TAKE 1 TABLET BY MOUTH DAILY   fluticasone 50 MCG/ACT nasal spray Commonly known as:  FLONASE Place 1 spray into both nostrils 2 (two) times daily as needed for allergies or rhinitis.   Fluticasone-Salmeterol 100-50 MCG/DOSE Aepb Commonly known as:  ADVAIR Inhale 1 puff into the lungs 2 (two) times daily.   meloxicam 15 MG tablet Commonly known as:  MOBIC Take 1 tablet (15 mg total) by mouth daily.   mirabegron ER 25 MG Tb24 tablet Commonly known as:  MYRBETRIQ Take 1  tablet (25 mg total) by mouth daily.   nystatin-triamcinolone ointment Commonly known as:  MYCOLOG Apply 1 application topically 2 (two) times daily.   PARoxetine 40 MG tablet Commonly known as:  PAXIL Take 1 tablet by mouth daily.   QUEtiapine 200 MG tablet Commonly known as:  SEROQUEL Take 200 mg by mouth at bedtime. Takes 200 mg at bedtime and 100 mg times per day   TIVICAY 50 MG tablet Generic drug:  dolutegravir TAKE 1 TABLET(50 MG) BY MOUTH DAILY   traZODone 100 MG tablet Commonly known as:   DESYREL Take 100 mg by mouth 2 (two) times daily.   VESICARE 5 MG tablet Generic drug:  solifenacin TAKE ONE TABLET BY MOUTH DAILY          Objective:    BP 125/79 (Cuff Size: Large)   Pulse 95   Temp 98.1 F (36.7 C) (Oral)   Ht 5' 5.5" (1.664 m)   Wt 240 lb 4 oz (109 kg)   LMP 02/01/2012   BMI 39.37 kg/m   Wt Readings from Last 3 Encounters:  06/11/16 240 lb 4 oz (109 kg)  04/05/16 251 lb (113.9 kg)  01/08/16 241 lb 6.4 oz (109.5 kg)    Physical Exam  Constitutional: She is oriented to person, place, and time. She appears well-developed and well-nourished. No distress.  Eyes: Conjunctivae and EOM are normal. Pupils are equal, round, and reactive to light. Right eye exhibits no discharge. Left eye exhibits no discharge. No scleral icterus.  Cardiovascular: Normal rate, regular rhythm, normal heart sounds and intact distal pulses.   No murmur heard. Pulmonary/Chest: Effort normal and breath sounds normal. No respiratory distress. She has no wheezes.  Musculoskeletal: Normal range of motion. She exhibits no edema or tenderness.  Neurological: She is alert and oriented to person, place, and time. No cranial nerve deficit. Coordination normal.  Skin: Skin is warm and dry. No rash noted. She is not diaphoretic.  Psychiatric: She has a normal mood and affect. Her behavior is normal.  Nursing note and vitals reviewed.     Assessment & Plan:   Problem List Items Addressed This Visit    None    Visit Diagnoses    Visual disturbance    -  Primary   Seeing floaters in the back of her eyes and feeling off balance.   Relevant Orders   Ambulatory referral to Ophthalmology   Visual floaters, bilateral       Relevant Orders   Ambulatory referral to Ophthalmology   Balance problem       Patient has been having a balance issue since she started developing these worse floaters in her eyes. We'll send her back to ophthalmology   Neck muscle strain, initial encounter       Feels  like she strained her neck because she is having to use her right side more with her left shoulder issues   Relevant Medications   cyclobenzaprine (FLEXERIL) 10 MG tablet       Follow up plan: Return if symptoms worsen or fail to improve.  Counseling provided for all of the vaccine components Orders Placed This Encounter  Procedures  . Ambulatory referral to Ophthalmology    Caryl Pina, MD [facility] 06/11/2016, 1:47 PM

## 2016-06-14 ENCOUNTER — Telehealth: Payer: Self-pay | Admitting: Family Medicine

## 2016-08-11 ENCOUNTER — Encounter: Payer: Self-pay | Admitting: *Deleted

## 2016-08-11 ENCOUNTER — Telehealth: Payer: Self-pay | Admitting: *Deleted

## 2016-08-11 NOTE — Telephone Encounter (Signed)
Patient called to request a letter from Dr. Johnnye Sima to assist her in obtaining food stamps. Letter completed and called patient and left a voice mail stating it was upfront and ready for pick up. Myrtis Hopping

## 2016-08-18 ENCOUNTER — Ambulatory Visit (INDEPENDENT_AMBULATORY_CARE_PROVIDER_SITE_OTHER): Payer: Self-pay

## 2016-08-18 ENCOUNTER — Other Ambulatory Visit: Payer: Self-pay

## 2016-08-18 DIAGNOSIS — Z79899 Other long term (current) drug therapy: Secondary | ICD-10-CM

## 2016-08-18 DIAGNOSIS — Z113 Encounter for screening for infections with a predominantly sexual mode of transmission: Secondary | ICD-10-CM

## 2016-08-18 DIAGNOSIS — Z23 Encounter for immunization: Secondary | ICD-10-CM

## 2016-08-18 DIAGNOSIS — B20 Human immunodeficiency virus [HIV] disease: Secondary | ICD-10-CM

## 2016-08-18 LAB — COMPREHENSIVE METABOLIC PANEL WITH GFR
ALT: 18 U/L (ref 6–29)
AST: 20 U/L (ref 10–35)
Albumin: 4.2 g/dL (ref 3.6–5.1)
Alkaline Phosphatase: 61 U/L (ref 33–115)
BUN: 11 mg/dL (ref 7–25)
CO2: 20 mmol/L (ref 20–31)
Calcium: 9 mg/dL (ref 8.6–10.2)
Chloride: 103 mmol/L (ref 98–110)
Creat: 0.83 mg/dL (ref 0.50–1.10)
Glucose, Bld: 117 mg/dL — ABNORMAL HIGH (ref 65–99)
Potassium: 3.9 mmol/L (ref 3.5–5.3)
Sodium: 136 mmol/L (ref 135–146)
Total Bilirubin: 0.4 mg/dL (ref 0.2–1.2)
Total Protein: 7.6 g/dL (ref 6.1–8.1)

## 2016-08-18 LAB — CBC
HCT: 43.6 % (ref 35.0–45.0)
Hemoglobin: 14.9 g/dL (ref 11.7–15.5)
MCH: 34.9 pg — ABNORMAL HIGH (ref 27.0–33.0)
MCHC: 34.2 g/dL (ref 32.0–36.0)
MCV: 102.1 fL — ABNORMAL HIGH (ref 80.0–100.0)
MPV: 9.5 fL (ref 7.5–12.5)
Platelets: 294 10*3/uL (ref 140–400)
RBC: 4.27 MIL/uL (ref 3.80–5.10)
RDW: 14.9 % (ref 11.0–15.0)
WBC: 9.2 10*3/uL (ref 3.8–10.8)

## 2016-08-18 LAB — LIPID PANEL
Cholesterol: 213 mg/dL — ABNORMAL HIGH (ref <200)
HDL: 41 mg/dL — ABNORMAL LOW (ref >50)
LDL Cholesterol: 133 mg/dL — ABNORMAL HIGH (ref <100)
Total CHOL/HDL Ratio: 5.2 Ratio — ABNORMAL HIGH (ref <5.0)
Triglycerides: 196 mg/dL — ABNORMAL HIGH (ref <150)
VLDL: 39 mg/dL — ABNORMAL HIGH (ref <30)

## 2016-08-19 LAB — T-HELPER CELL (CD4) - (RCID CLINIC ONLY)
CD4 % Helper T Cell: 11 % — ABNORMAL LOW (ref 33–55)
CD4 T Cell Abs: 330 /uL — ABNORMAL LOW (ref 400–2700)

## 2016-08-19 LAB — URINE CYTOLOGY ANCILLARY ONLY
Chlamydia: NEGATIVE
Neisseria Gonorrhea: NEGATIVE

## 2016-08-19 LAB — RPR

## 2016-08-20 LAB — HIV-1 RNA QUANT-NO REFLEX-BLD
HIV 1 RNA Quant: <20 copies/mL (ref <20)
HIV-1 RNA Quant, Log: <1.3 Log copies/mL (ref <1.30)

## 2016-08-26 ENCOUNTER — Telehealth: Payer: Self-pay | Admitting: *Deleted

## 2016-08-26 NOTE — Telephone Encounter (Signed)
Patient called and advised the letter written for her 07/2016 was not worded correctly. She advised it needs to state she can not work until after her disability hearing. Advised her not sure he will write that but can ask and give her a call back. She advised she needs is faxed to [place] at [phone]. She advised she just needs to keep her food stamps until she goes to her hearing. Advised can not promise anything but I will ask the doctor and give her a call back soon.

## 2016-08-30 ENCOUNTER — Encounter: Payer: Self-pay | Admitting: *Deleted

## 2016-08-30 NOTE — Telephone Encounter (Signed)
Notified the patient that her letter has been faxed and I am placing a copy in the mail to her.

## 2016-09-10 ENCOUNTER — Ambulatory Visit (INDEPENDENT_AMBULATORY_CARE_PROVIDER_SITE_OTHER): Payer: Medicaid Other | Admitting: Family Medicine

## 2016-09-10 ENCOUNTER — Encounter: Payer: Self-pay | Admitting: Family Medicine

## 2016-09-10 VITALS — BP 115/83 | HR 94 | Temp 97.2°F | Ht 65.5 in | Wt 245.0 lb

## 2016-09-10 DIAGNOSIS — S161XXA Strain of muscle, fascia and tendon at neck level, initial encounter: Secondary | ICD-10-CM

## 2016-09-10 DIAGNOSIS — J441 Chronic obstructive pulmonary disease with (acute) exacerbation: Secondary | ICD-10-CM

## 2016-09-10 MED ORDER — METHYLPREDNISOLONE ACETATE 80 MG/ML IJ SUSP
80.0000 mg | Freq: Once | INTRAMUSCULAR | Status: AC
  Administered 2016-09-10: 80 mg via INTRAMUSCULAR

## 2016-09-10 MED ORDER — CYCLOBENZAPRINE HCL 10 MG PO TABS: 10.0000 mg | ORAL_TABLET | Freq: Three times a day (TID) | ORAL | 1 refills | Status: DC | PRN

## 2016-09-10 MED ORDER — DOXYCYCLINE HYCLATE 100 MG PO TABS: 100.0000 mg | ORAL_TABLET | Freq: Two times a day (BID) | ORAL | 0 refills | Status: DC
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
## 2016-09-22 ENCOUNTER — Ambulatory Visit: Payer: Self-pay | Admitting: Infectious Diseases

## 2016-09-28 ENCOUNTER — Other Ambulatory Visit: Payer: Self-pay | Admitting: Family Medicine

## 2016-09-28 DIAGNOSIS — S161XXA Strain of muscle, fascia and tendon at neck level, initial encounter: Secondary | ICD-10-CM

## 2016-10-11 ENCOUNTER — Ambulatory Visit: Payer: Self-pay | Admitting: Infectious Diseases

## 2016-10-27 ENCOUNTER — Encounter: Payer: Self-pay | Admitting: Infectious Diseases

## 2016-10-27 ENCOUNTER — Ambulatory Visit (INDEPENDENT_AMBULATORY_CARE_PROVIDER_SITE_OTHER): Payer: Medicaid Other | Admitting: Infectious Diseases

## 2016-10-27 VITALS — BP 113/78 | HR 78 | Temp 98.0°F | Ht 65.5 in | Wt 256.0 lb

## 2016-10-27 DIAGNOSIS — J439 Emphysema, unspecified: Secondary | ICD-10-CM

## 2016-10-27 DIAGNOSIS — B20 Human immunodeficiency virus [HIV] disease: Secondary | ICD-10-CM

## 2016-10-27 DIAGNOSIS — F1991 Other psychoactive substance use, unspecified, in remission: Secondary | ICD-10-CM

## 2016-10-27 DIAGNOSIS — Z87898 Personal history of other specified conditions: Secondary | ICD-10-CM | POA: Diagnosis not present

## 2016-10-27 MED ORDER — ABACAVIR-DOLUTEGRAVIR-LAMIVUD 600-50-300 MG PO TABS: 1.0000 | ORAL_TABLET | Freq: Every day | ORAL | 11 refills | Status: DC

## 2016-10-27 MED ORDER — ABACAVIR-DOLUTEGRAVIR-LAMIVUD 600-50-300 MG PO TABS: 1.0000 | ORAL_TABLET | Freq: Every day | ORAL | 3 refills | Status: DC

## 2016-10-27 NOTE — Progress Notes (Signed)
HPI: Nicole Silva is a 46 y.o. female who is here for her HIV f/u.   Allergies: Allergies  Allergen Reactions  . Food     Peaches. Hives   . Latex Hives  . Penicillins     REACTION: hives    Vitals: Temp: 98 F (36.7 C) (02/14 1107) Temp Source: Oral (02/14 1107) BP: 113/78 (02/14 1107) Pulse Rate: 78 (02/14 1107)  Past Medical History: Past Medical History:  Diagnosis Date  . Anxiety   . Asthma   . Cancer ([place])    lymphoma  . Chronic hepatitis C without hepatic coma ([place]) 01/29/2015  . Complication of anesthesia   . COPD exacerbation ([place]) 01/29/2015  . Depression   . H/O intravenous drug use in remission 01/29/2015  . Hep C w/o coma, chronic ([place])   . HIV (human immunodeficiency virus infection) ([place]) 1998  . HIV disease ([place])   . 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 PONV (postoperative nausea and vomiting)   . PTSD (post-traumatic stress disorder)   . Shortness of breath    with exertion  . Smoker 01/29/2015    Social History: Social History   Social History  . Marital status: Married    Spouse name: N/A  . Number of children: N/A  . Years of education: N/A   Social History Main Topics  . Smoking status: Current Every Day Smoker    Packs/day: 0.50    Years: 30.00    Types: Cigarettes  . Smokeless tobacco: Never Used     Comment: not yet ready  . Alcohol use No     Comment: recovering alcoholic   . Drug use: No  . Sexual activity: Not Currently    Partners: Male    Birth control/ protection: Condom     Comment: pt. declined condoms   Other Topics Concern  . None   Social History Narrative  . None   HIV Genotype Composite Data Genotype Dates:   Mutations in Bold impact drug susceptibility RT Mutations K103KN, K101KQ, V179I  PI Mutations None  Integrase Mutations E92EG   Interpretation of Genotype Data per [employer] HIV Database Nucleoside RTIs  abacavir  (ABC) Susceptible zidovudine (AZT) Susceptible emtricitabine (FTC) Susceptible lamivudine (3TC) Susceptible tenofovir (TDF) Susceptible   Non-Nucleoside RTIs  efavirenz (EFV) High-Level Resistance etravirine (ETR) Susceptible nevirapine (NVP) High-Level Resistance rilpivirine (RPV) Susceptible   Protease Inhibitors  atazanavir/r (ATV/r) Susceptible darunavir/r (DRV/r) Susceptible lopinavir/r (LPV/r) Susceptible   Integrase Inhibitors  dolutegravir (DTG) Susceptible elvitegravir (EVG) Intermediate Resistance raltegravir (RAL) Low-Level Resistance    Previous Regimen: ISN, TRV, ATV/r  Current Regimen: Descovy/Tivicay  Labs: HIV 1 RNA Quant (copies/mL)  Date Value  08/18/2016 <20  06/25/2015 <20  01/29/2015 38 (H)   CD4 T Cell Abs (/uL)  Date Value  08/18/2016 330 (L)  06/25/2015 280 (L)  01/30/2015 240 (L)   Hep B S Ab (no units)  Date Value  09/04/2014 NEG   Hepatitis B Surface Ag (no units)  Date Value  09/04/2014 NEGATIVE   HCV Ab (no units)  Date Value  09/04/2014 REACTIVE (A)    CrCl: CrCl cannot be calculated (Patient's most recent lab result is older than the maximum 21 days allowed.).  Lipids:    Component Value Date/Time   CHOL 213 (H) 08/18/2016 0853   CHOL 211 (H) 10/22/2015 1220   TRIG 196 (H) 08/18/2016 0853   HDL 41 (L) 08/18/2016 0853   HDL  41 10/22/2015 1220   CHOLHDL 5.2 (H) 08/18/2016 0853   VLDL 39 (H) 08/18/2016 0853   LDLCALC 133 (H) 08/18/2016 0853   LDLCALC 130 (H) 10/22/2015 1220    Assessment: Nicole Silva is currently doing well on her regimen of DTG/Descovy. She is complaining of night sweats and nausea. She would like to go to a once a day regimen. Based on her genosure back in 2016 has had integrase resistance already so EVG will be out the window. Instead, Dr Johnnye Sima will try Triumeq in her. Told her to take it in the morning with her breakfast. We will send the rx to [facility] so they can mail it to her.    Recommendations:  Dc Descovy/DTG Start Triumeq 1 PO qday Send rx to [facility], PharmD, BCPS, AAHIVP, CPP [facility] [place] [facility] 10/27/2016, 11:58 AM

## 2016-10-27 NOTE — Assessment & Plan Note (Signed)
Appears stable. She will f/u with PCP.

## 2016-10-27 NOTE — Progress Notes (Signed)
   Subjective:    Patient ID: Nicole Silva, female    DOB: 19-Sep-1970, 46 y.o.   MRN: [id number]  HPI 46 yo F with HIV+, treated Hep C (VL - 06-2015), cough variant asthma, obesity. Still smoking, trying to cut back. When she doesn't smoke, she "eats all day long". On Desc/DTGv. Wants to change to [place].  She was in [place] 07-2016.  Has been having night sweats for last month. She atributes it to the ART. When she takes ART in AM she does not have night sweats but does have nausea.   HIV 1 RNA Quant (copies/mL)  Date Value  08/18/2016 <20  06/25/2015 <20  01/29/2015 38 (H)   CD4 T Cell Abs (/uL)  Date Value  08/18/2016 330 (L)  06/25/2015 280 (L)  01/30/2015 240 (L)    Review of Systems  Constitutional: Positive for appetite change. Negative for fever and unexpected weight change.  Respiratory: Positive for cough. Negative for shortness of breath.   Cardiovascular: Positive for leg swelling.  Gastrointestinal: Positive for constipation. Negative for diarrhea.  Genitourinary: Negative for difficulty urinating.  occas yellow/black sputum.  has been taking dulcolax for constipation. Drinking 40 oz water/day.  Has disabilty hearing on Jan 19, 2017.  Still smokes marijuana to improve her appetite.  Had CXR 08-24-16 clear.  She has been noticing LE edema.  Equilibrium is off on her L side.     Objective:   Physical Exam  Constitutional: She appears well-developed and well-nourished.  HENT:  Mouth/Throat: No oropharyngeal exudate.  Eyes: EOM are normal. Pupils are equal, round, and reactive to light.  Neck: Neck supple.  Cardiovascular: Normal rate, regular rhythm and normal heart sounds.   Pulmonary/Chest: Effort normal. She has wheezes.  Mild wheezes, upper airway noise.   Abdominal: Soft. Bowel sounds are normal. She exhibits no distension. There is no tenderness. There is no rebound.  Musculoskeletal: She exhibits edema.  Lymphadenopathy:    She has no cervical  adenopathy.      Assessment & Plan:

## 2016-10-27 NOTE — Assessment & Plan Note (Signed)
She has changed her friend circle. Commits that she is staying clean.

## 2016-10-27 NOTE — Assessment & Plan Note (Addendum)
Spoke with pharm - genvoya would have multiple drug interactions  Will try Triumeq Bictegravir would be alternative as well, should be out soon.  Will get her into food pantry.  rtc in 3 months

## 2016-11-09 ENCOUNTER — Other Ambulatory Visit: Payer: Self-pay | Admitting: Pharmacist Clinician (PhC)/ Clinical Pharmacy Specialist

## 2016-11-09 DIAGNOSIS — B182 Chronic viral hepatitis C: Secondary | ICD-10-CM

## 2016-11-09 MED FILL — TRIUMEQ 600-50-300 MG TABS: 600-50-300 | 30 days supply | Qty: 30 | Fill #0

## 2016-11-09 NOTE — Progress Notes (Signed)
Bringing in for a hep C [id number]

## 2016-11-18 ENCOUNTER — Other Ambulatory Visit: Payer: Self-pay | Admitting: Infectious Diseases

## 2016-11-19 ENCOUNTER — Other Ambulatory Visit: Payer: Self-pay | Admitting: Infectious Diseases

## 2016-11-23 ENCOUNTER — Other Ambulatory Visit: Payer: Medicaid Other

## 2016-12-08 ENCOUNTER — Other Ambulatory Visit: Payer: Self-pay | Admitting: Pharmacist Clinician (PhC)/ Clinical Pharmacy Specialist

## 2016-12-08 ENCOUNTER — Telehealth: Payer: Self-pay | Admitting: Pharmacist Clinician (PhC)/ Clinical Pharmacy Specialist

## 2016-12-08 MED ORDER — ONDANSETRON HCL 4 MG PO TABS: 4.0000 mg | ORAL_TABLET | Freq: Three times a day (TID) | ORAL | 0 refills | Status: DC | PRN

## 2016-12-08 MED FILL — ONDANSETRON HCL 4 MG TABLET: 4 | 6 days supply | Qty: 20 | Fill #0

## 2016-12-08 MED FILL — TRIUMEQ 600-50-300 MG TABS: 600-50-300 | 30 days supply | Qty: 30 | Fill #1

## 2016-12-08 NOTE — Progress Notes (Signed)
Nicole Silva called to complain about some nausea. She said that it has improved. Will give her about 20 tabs of zofran to use only as needed. [facility] will mail to her.

## 2016-12-08 NOTE — Telephone Encounter (Signed)
Gianny called yesterday and left a VM that she has had some nausea with Triumeq. Her mother will get her to call me back.

## 2017-01-06 ENCOUNTER — Other Ambulatory Visit: Payer: Self-pay | Admitting: Infectious Diseases

## 2017-01-06 MED FILL — TRIUMEQ 600-50-300 MG TABS: 600-50-300 | 30 days supply | Qty: 30 | Fill #2

## 2017-01-07 ENCOUNTER — Other Ambulatory Visit: Payer: Self-pay | Admitting: Pharmacist Clinician (PhC)/ Clinical Pharmacy Specialist

## 2017-01-07 ENCOUNTER — Other Ambulatory Visit: Payer: Self-pay | Admitting: Family Medicine

## 2017-01-07 DIAGNOSIS — S161XXA Strain of muscle, fascia and tendon at neck level, initial encounter: Secondary | ICD-10-CM

## 2017-01-07 MED ORDER — ONDANSETRON HCL 4 MG PO TABS: ORAL_TABLET | ORAL | 0 refills | Status: DC

## 2017-01-07 MED FILL — ONDANSETRON HCL 4 MG TABLET: 4 | 6 days supply | Qty: 20 | Fill #0

## 2017-01-07 NOTE — Progress Notes (Signed)
Give 1 more refill of zofran

## 2017-02-01 ENCOUNTER — Ambulatory Visit: Payer: Medicaid Other | Admitting: Infectious Diseases

## 2017-02-18 MED FILL — TRIUMEQ 600-50-300 MG TABS: 600-50-300 | 30 days supply | Qty: 30 | Fill #3

## 2017-03-09 ENCOUNTER — Ambulatory Visit: Payer: Medicaid Other | Admitting: Family Medicine

## 2017-03-21 ENCOUNTER — Ambulatory Visit: Payer: Medicaid Other | Admitting: Family Medicine

## 2017-03-22 ENCOUNTER — Telehealth: Payer: Self-pay | Admitting: Family Medicine

## 2017-03-22 ENCOUNTER — Encounter: Payer: Self-pay | Admitting: Family Medicine

## 2017-03-22 NOTE — Telephone Encounter (Signed)
Spoke to pt and she states she missed appt due to [facility] not picking her up. She did want to reschedule and handed the phone to her mother to set up appt time. Appt with Dr Dettinger given 7/19 at 1:10.

## 2017-03-31 ENCOUNTER — Ambulatory Visit (INDEPENDENT_AMBULATORY_CARE_PROVIDER_SITE_OTHER): Payer: Medicaid Other | Admitting: Family Medicine

## 2017-03-31 ENCOUNTER — Encounter: Payer: Self-pay | Admitting: Family Medicine

## 2017-03-31 VITALS — BP 120/76 | HR 74 | Temp 98.1°F | Ht 65.5 in | Wt 253.0 lb

## 2017-03-31 DIAGNOSIS — R8789 Other abnormal findings in specimens from female genital organs: Secondary | ICD-10-CM

## 2017-03-31 DIAGNOSIS — M1612 Unilateral primary osteoarthritis, left hip: Secondary | ICD-10-CM

## 2017-03-31 DIAGNOSIS — Z01419 Encounter for gynecological examination (general) (routine) without abnormal findings: Secondary | ICD-10-CM

## 2017-03-31 DIAGNOSIS — R87618 Other abnormal cytological findings on specimens from cervix uteri: Secondary | ICD-10-CM

## 2017-03-31 DIAGNOSIS — N926 Irregular menstruation, unspecified: Secondary | ICD-10-CM

## 2017-03-31 LAB — PREGNANCY, URINE: Preg Test, Ur: NEGATIVE

## 2017-03-31 MED ORDER — CYCLOBENZAPRINE HCL 10 MG PO TABS: ORAL_TABLET | ORAL | 0 refills | Status: DC

## 2017-03-31 MED ORDER — METHYLPREDNISOLONE ACETATE 80 MG/ML IJ SUSP
80.0000 mg | Freq: Once | INTRAMUSCULAR | Status: AC
  Administered 2017-03-31: 80 mg via INTRAMUSCULAR

## 2017-03-31 NOTE — Progress Notes (Signed)
BP 120/76   Pulse 74   Temp 98.1 F (36.7 C) (Oral)   Ht 5' 5.5" (1.664 m)   Wt 253 lb (114.8 kg)   LMP 02/01/2012   BMI 41.46 kg/m    Subjective:    Patient ID: Nicole Silva, female    DOB: 03-30-1971, 46 y.o.   MRN: [id number]  HPI: Nicole Silva is a 46 y.o. female presenting on 03/31/2017 for Gynecologic Exam and Possible Pregnancy (missed cycle this month, only spotting)   HPI Well woman exam with gynecological exam Patient is coming in for routine well woman exam with gynecological exam and breast exam. She denies any problems with her breasts such as nipple drainage or skin changes except for 2 scratches that she has on her left breast which she said she sustained just recently from catching it on a hanger. She has no other rashes or lumps or nodules that she's noted. She is due for mammogram and will schedule that as well. She denies any vaginal discharge or irritation or bleeding outside of her regular menstrual cycles. She does say that she has missed her menstrual cycle this month and only had a little bit of spotting and wants to be checked for a urine pregnancy test. She denies any dysuria or hematuria.  Left hip pain Patient has known arthritis in her left hip and says that it is flared up and is bothering her more and is also causing her left lower back to be sore as well and she would like a steroid injection today like she has had previously to help with this. She would also like a refill of Flexeril. She is trying to walk more but denies doing regular stretches. She denies any numbness or weakness or giving way or popping or catching. She denies any fevers or chills. She denies any overlying skin changes  Relevant past medical, surgical, family and social history reviewed and updated as indicated. Interim medical history since our last visit reviewed. Allergies and medications reviewed and updated.  Review of Systems  Constitutional: Negative for chills and  fever.  HENT: Negative for congestion, ear discharge and ear pain.   Eyes: Negative for redness and visual disturbance.  Respiratory: Negative for chest tightness and shortness of breath.   Cardiovascular: Negative for chest pain and leg swelling.  Genitourinary: Positive for menstrual problem. Negative for difficulty urinating, dysuria, hematuria, pelvic pain, vaginal bleeding, vaginal discharge and vaginal pain.  Musculoskeletal: Positive for arthralgias. Negative for back pain and gait problem.  Skin: Negative for color change and rash.  Neurological: Negative for dizziness, light-headedness and headaches.  Psychiatric/Behavioral: Negative for agitation and behavioral problems.  All other systems reviewed and are negative.   Per HPI unless specifically indicated above        Objective:    BP 120/76   Pulse 74   Temp 98.1 F (36.7 C) (Oral)   Ht 5' 5.5" (1.664 m)   Wt 253 lb (114.8 kg)   LMP 02/01/2012   BMI 41.46 kg/m   Wt Readings from Last 3 Encounters:  03/31/17 253 lb (114.8 kg)  10/27/16 256 lb (116.1 kg)  09/10/16 245 lb (111.1 kg)    Physical Exam  Constitutional: She is oriented to person, place, and time. She appears well-developed and well-nourished. No distress.  Eyes: Conjunctivae are normal.  Neck: Neck supple. No thyromegaly present.  Cardiovascular: Normal rate, regular rhythm, normal heart sounds and intact distal pulses.   No murmur heard. Pulmonary/Chest:  Effort normal and breath sounds normal. No respiratory distress. She has no wheezes. Right breast exhibits no inverted nipple, no mass, no nipple discharge, no skin change and no tenderness. Left breast exhibits no inverted nipple, no mass, no nipple discharge, no skin change and no tenderness. Breasts are symmetrical (Large pendulous breasts bilaterally).  Abdominal: Soft. Bowel sounds are normal. She exhibits no distension. There is no tenderness. There is no rebound and no guarding.  Genitourinary:  Vagina normal and uterus normal. No breast swelling, tenderness, discharge or bleeding. Pelvic exam was performed with patient supine. There is no rash or lesion on the right labia. There is no rash or lesion on the left labia. Uterus is not deviated, not enlarged, not fixed and not tender. Cervix exhibits no motion tenderness, no discharge and no friability. Right adnexum displays no mass and no tenderness. Left adnexum displays no mass and no tenderness.  Musculoskeletal: Normal range of motion. She exhibits no edema.       Left hip: She exhibits tenderness. She exhibits normal range of motion and normal strength.  Lymphadenopathy:    She has no cervical adenopathy.    She has no axillary adenopathy.  Neurological: She is alert and oriented to person, place, and time. Coordination normal.  Skin: Skin is warm and dry. No rash noted. She is not diaphoretic.  Psychiatric: She has a normal mood and affect. Her behavior is normal.  Nursing note and vitals reviewed.       Assessment & Plan:   Problem List Items Addressed This Visit    None    Visit Diagnoses    Well woman exam with routine gynecological exam    -  Primary   Relevant Orders   Pap IG, rfx HPV all pth   Irregular menstrual cycle       Relevant Orders   Pregnancy, urine   Osteoarthritis of left hip, unspecified osteoarthritis type       Relevant Medications   cyclobenzaprine (FLEXERIL) 10 MG tablet   methylPREDNISolone acetate (DEPO-MEDROL) injection 80 mg (Start on 03/31/2017  2:30 PM)       Follow up plan: Return if symptoms worsen or fail to improve.  Counseling provided for all of the vaccine components Orders Placed This Encounter  Procedures  . Pregnancy, urine    Caryl Pina, MD [facility] 03/31/2017, 2:21 PM

## 2017-04-06 LAB — PAP IG, RFX HPV ALL PTH: PAP Smear Comment: 0

## 2017-04-06 LAB — HPV DNA PROBE HIGH RISK, AMPLIFIED: HPV, high-risk: POSITIVE — AB

## 2017-04-07 NOTE — Addendum Note (Signed)
Addended by: Antonietta Barcelona D on: 04/07/2017 11:14 AM   Modules accepted: Orders

## 2017-04-11 ENCOUNTER — Telehealth: Payer: Self-pay | Admitting: Pharmacist

## 2017-04-11 MED FILL — TRIUMEQ 600-50-300 MG TABS: 600-50-300 | 30 days supply | Qty: 30 | Fill #4

## 2017-04-11 NOTE — Telephone Encounter (Signed)
We have been trying to get a hold of Nicole Silva for several months now regarding her medications.  She has been getting refills but states she has missed several doses per month due to falling asleep and forgetting.  She doesn't see Dr. Johnnye Silva until October, but I will see her when she comes in for dental appt 8/16 at 1130am. Will do labs and adherence counseling with her then.

## 2017-04-25 ENCOUNTER — Encounter: Payer: Self-pay | Admitting: Obstetrics and Gynecology

## 2017-04-25 ENCOUNTER — Ambulatory Visit (INDEPENDENT_AMBULATORY_CARE_PROVIDER_SITE_OTHER): Payer: Medicaid Other | Admitting: Obstetrics and Gynecology

## 2017-04-25 VITALS — BP 140/80 | HR 78 | Wt 245.2 lb

## 2017-04-25 DIAGNOSIS — D071 Carcinoma in situ of vulva: Secondary | ICD-10-CM | POA: Diagnosis not present

## 2017-04-25 DIAGNOSIS — R87612 Low grade squamous intraepithelial lesion on cytologic smear of cervix (LGSIL): Secondary | ICD-10-CM | POA: Diagnosis not present

## 2017-04-25 DIAGNOSIS — N904 Leukoplakia of vulva: Secondary | ICD-10-CM

## 2017-04-25 NOTE — Progress Notes (Addendum)
  Nicole Silva 46 y.o. No obstetric history on file. here for colposcopy for low-grade squamous intraepithelial neoplasia (LGSIL - encompassing HPV,mild dysplasia,CIN I) pap smear on 03/31/17.  Discussed role for HPV in cervical dysplasia, need for surveillance. Pt has hx of HIV for 22 years. Her lower pelvic pain has been occurring for one month. She has hx of early (first  Trimester) miscarriages.  Patient given informed consent, signed copy in the chart, time out was performed.  Placed in lithotomy position. Cervix viewed with speculum and colposcope after application of acetic acid.   Colposcopy adequate? Yes Vulvar biopsies obtained of vulvar lesions at left; 4 oclock and right; 8 o clock at the introitus ECC specimen obtained All specimens were labelled and sent to pathology.   Colposcopy  IMPRESSION: No visible dysplasia External genitalia has thickening and hyperkeratosis skin changes all the way around to posterior perineum biopsies will be taken at 5:00 and 8:00 Smooth cervix with minimal changes ECC is performed. The cervix itself appears completely smooth without distinct lesions  Vulvar biopsy After 5 cc of 1% local anesthesia was applied to the areas on the vulva at 5:00 and 8:00 where biopsies are planned, skin biopsy is taken from each area using colposcopic biopsy device, and silver nitrate applied to achieve hemostasis Patient was given post procedure instructions. Will follow up pathology and manage accordingly.  Routine preventative health maintenance measures emphasized. Impression:  Impression: VIN 2 By signing my name below, I, Izna Ahmed, attest that this documentation has been prepared under the direction and in the presence of Jonnie Kind, MD. Electronically Signed: Jabier Gauss, Medical Scribe. 04/25/17. 2:44 PM.  I personally performed the services described in this documentation, which was SCRIBED in my presence. The recorded information has been reviewed  and considered accurate. It has been edited as necessary during review. Jonnie Kind, MD

## 2017-04-28 ENCOUNTER — Other Ambulatory Visit: Payer: Self-pay | Admitting: Pharmacist

## 2017-04-28 ENCOUNTER — Ambulatory Visit: Payer: Medicaid Other

## 2017-05-05 ENCOUNTER — Encounter: Payer: Self-pay | Admitting: Obstetrics and Gynecology

## 2017-05-05 ENCOUNTER — Ambulatory Visit (INDEPENDENT_AMBULATORY_CARE_PROVIDER_SITE_OTHER): Payer: Medicaid Other | Admitting: Obstetrics and Gynecology

## 2017-05-05 VITALS — BP 100/70 | HR 78 | Wt 248.6 lb

## 2017-05-05 DIAGNOSIS — D071 Carcinoma in situ of vulva: Secondary | ICD-10-CM

## 2017-05-05 DIAGNOSIS — Z21 Asymptomatic human immunodeficiency virus [HIV] infection status: Secondary | ICD-10-CM | POA: Diagnosis not present

## 2017-05-05 DIAGNOSIS — B1920 Unspecified viral hepatitis C without hepatic coma: Secondary | ICD-10-CM | POA: Diagnosis not present

## 2017-05-05 MED FILL — TRIUMEQ 600-50-300 MG TABS: 600-50-300 | 30 days supply | Qty: 30 | Fill #5
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
## 2017-05-12 ENCOUNTER — Other Ambulatory Visit: Payer: Medicaid Other

## 2017-05-12 DIAGNOSIS — B20 Human immunodeficiency virus [HIV] disease: Secondary | ICD-10-CM

## 2017-05-12 DIAGNOSIS — B182 Chronic viral hepatitis C: Secondary | ICD-10-CM

## 2017-05-13 LAB — T-HELPER CELL (CD4) - (RCID CLINIC ONLY)
CD4 % Helper T Cell: 9 % — ABNORMAL LOW (ref 33–55)
CD4 T Cell Abs: 244 /uL — ABNORMAL LOW (ref 400–2700)

## 2017-05-18 LAB — HIV-1 RNA QUANT-NO REFLEX-BLD
HIV 1 RNA Quant: <20 copies/mL — AB
HIV-1 RNA Quant, Log: <1.3 Log copies/mL — AB

## 2017-05-18 LAB — HEPATITIS C RNA QUANTITATIVE
HCV Quantitative Log: <1.18 Log IU/mL
HCV Quantitative: <15 IU/mL

## 2017-05-24 MED FILL — TRIUMEQ 600-50-300 MG TABS: 600-50-300 | 30 days supply | Qty: 30 | Fill #6

## 2017-05-25 ENCOUNTER — Encounter: Payer: Medicaid Other | Admitting: Obstetrics and Gynecology

## 2017-05-30 ENCOUNTER — Other Ambulatory Visit: Payer: Medicaid Other

## 2017-05-31 ENCOUNTER — Ambulatory Visit: Payer: Self-pay

## 2017-06-01 ENCOUNTER — Telehealth: Payer: Self-pay | Admitting: *Deleted

## 2017-06-01 NOTE — Telephone Encounter (Signed)
Patient states she is breaking out with boils on breast and right thight. She also has one on stomach. Pt concerned and wants to see if we can prescribe something. Informed patient that since this is a new problem she would need to be evaluated for it. Also states she has seen infectious disease so will wait until her appointment on 9/27.

## 2017-06-08 ENCOUNTER — Ambulatory Visit (INDEPENDENT_AMBULATORY_CARE_PROVIDER_SITE_OTHER): Payer: Medicaid Other | Admitting: Family Medicine

## 2017-06-08 ENCOUNTER — Encounter: Payer: Self-pay | Admitting: Family Medicine

## 2017-06-08 VITALS — BP 140/84 | HR 100 | Temp 97.3°F | Ht 65.5 in | Wt 253.0 lb

## 2017-06-08 DIAGNOSIS — L089 Local infection of the skin and subcutaneous tissue, unspecified: Secondary | ICD-10-CM

## 2017-06-08 MED ORDER — DOXYCYCLINE HYCLATE 100 MG PO TABS: 100.0000 mg | ORAL_TABLET | Freq: Two times a day (BID) | ORAL | 0 refills | Status: DC

## 2017-06-08 MED ORDER — NAPROXEN 500 MG PO TABS: 500.0000 mg | ORAL_TABLET | Freq: Two times a day (BID) | ORAL | 0 refills | Status: DC

## 2017-06-08 NOTE — Progress Notes (Signed)
Subjective: CC: wound PCP: Dettinger, Fransisca Kaufmann, MD [id number] Nicole Silva is a 46 y.o. female presenting to clinic today for:  1. Wound Patient reports that she developed a boil on the left side of her lower belly a couple of days ago. She notes that she been applying a salve to affected area and attempts to heal it. She notes that boil has now opened up and progressed to an open wound. She reports associated tenderness, increased warmth. She reports purulence. She denies fevers, chills, nausea, vomiting. No preceding injury. Of note, she does have HIV and is an active tobacco user.  Allergies  Allergen Reactions  . Latex Hives  . Peach [Prunus Persica]   . Penicillins     REACTION: hives   Past Medical History:  Diagnosis Date  . Anxiety   . Asthma   . Cancer ([place])    lymphoma  . Chronic hepatitis Nicole without hepatic coma ([place]) 01/29/2015  . Complication of anesthesia   . COPD exacerbation ([place]) 01/29/2015  . Depression   . H/O intravenous drug use in remission 01/29/2015  . Hep Nicole w/o coma, chronic ([place])   . HIV (human immunodeficiency virus infection) ([place]) 1998  . HIV disease ([place])   . 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 PONV (postoperative nausea and vomiting)   . PTSD (post-traumatic stress disorder)   . Shortness of breath    with exertion  . Smoker 01/29/2015   Family History  Problem Relation Age of Onset  . Diverticulitis Mother   . COPD Mother   . Alcohol abuse Father   . Hypertension Father   . Schizophrenia Father   . Anesthesia problems Neg Hx   . Hypotension Neg Hx   . Malignant hyperthermia Neg Hx   . Pseudochol deficiency Neg Hx    Social Hx: active smoker.Current medications reviewed.   ROS: Per HPI  Objective: Office vital signs reviewed. BP 140/84   Pulse 100   Temp (!) 97.3 F (36.3 Nicole) (Oral)   Ht 5' 5.5" (1.664 m)   Wt 253 lb (114.8 kg)   LMP 02/01/2012   BMI 41.46 kg/m    Physical Examination:  General: Awake, alert, morbidly obese, No acute distress Skin: left lower abdomen with a 1 cm x 2 cm ulceration. Wound is indurated (about 3 inches in diameter). There is no palpable fluctuance. There is exquisite tenderness to palpation. Mild increased warmth. Mild surrounding erythema. There is an associated pustule about 1 inch superior to ulceration.  Assessment/ Plan: 46 y.o. female   1. Soft tissue infection Her exam is significant for moderate induration and what appears to be ulceration of initial lesion. She is at increased risk for serious infection given her HIV status. Her last CD4 count was obtained in August 2018, this was 244. Her HIV RNA Quant was <20. We discussed consideration for evaluation in the emergency department. Patient wishes to proceed with outpatient treatment with doxycycline and agrees to follow up closely in the next 24-48 hours. If her symptoms are not improving in the next 24-48 hours, we will attempt to have surgery see her urgently versus send her to the emergency Department. We did discuss that the infection may be deeper than what we can see on today's exam. Strict return precautions and reasons for emergent evaluation the emergency department were reviewed with the patient. She voiced good understanding. I did advise her that she should call her OB/GYN  and informed him of current soft tissue infection, as this will likely delay her planned surgery. Patient will follow-up in the next 24-48 hours with either myself or her PCP, who was present for today's exam. - doxycycline (VIBRA-TABS) 100 MG tablet; Take 1 tablet (100 mg total) by mouth 2 (two) times daily.  Dispense: 20 tablet; Refill: 0 - naproxen (NAPROSYN) 500 MG tablet; Take 1 tablet (500 mg total) by mouth 2 (two) times daily with a meal.  Dispense: 14 tablet; Refill: 0   Meds ordered this encounter  Medications  . doxycycline (VIBRA-TABS) 100 MG tablet    Sig: Take 1 tablet (100  mg total) by mouth 2 (two) times daily.    Dispense:  20 tablet    Refill:  0  . naproxen (NAPROSYN) 500 MG tablet    Sig: Take 1 tablet (500 mg total) by mouth 2 (two) times daily with a meal.    Dispense:  14 tablet    Refill:  0    Nicole Silva Windell Moulding, DO [place] [phone]

## 2017-06-08 NOTE — Patient Instructions (Signed)
You have a soft tissue infection. I prescribed doxycycline for you to take twice a day. Take this medication with food. Schedule follow-up appointment to be seen on Friday with either myself or Dr. Warrick Parisian, try make this for the morning time. If your wound gets worse, the redness spreads further, you develop worsening pain, you have a fever, nausea, vomiting or chills please seek immediate medical attention in the emergency department. Continue keeping the area clean.   Skin Abscess A skin abscess is an infected area on or under your skin that contains a collection of pus and other material. An abscess may also be called a furuncle, carbuncle, or boil. An abscess can occur in or on almost any part of your body. Some abscesses break open (rupture) on their own. Most continue to get worse unless they are treated. The infection can spread deeper into the body and eventually into your blood, which can make you feel ill. Treatment usually involves draining the abscess. What are the causes? An abscess occurs when germs, often bacteria, pass through your skin and cause an infection. This may be caused by:  A scrape or cut on your skin.  A puncture wound through your skin, including a needle injection.  Blocked oil or sweat glands.  Blocked and infected hair follicles.  A cyst that forms beneath your skin (sebaceous cyst) and becomes infected.  What increases the risk? This condition is more likely to develop in people who:  Have a weak body defense system (immune system).  Have diabetes.  Have dry and irritated skin.  Get frequent injections or use illegal IV drugs.  Have a foreign body in a wound, such as a splinter.  Have problems with their lymph system or veins.  What are the signs or symptoms? An abscess may start as a painful, firm bump under the skin. Over time, the abscess may get larger or become softer. Pus may appear at the top of the abscess, causing pressure and pain. It  may eventually break through the skin and drain. Other symptoms include:  Redness.  Warmth.  Swelling.  Tenderness.  A sore on the skin.  How is this diagnosed? This condition is diagnosed based on your medical history and a physical exam. A sample of pus may be taken from the abscess to find out what is causing the infection and what antibiotics can be used to treat it. You also may have:  Blood tests to look for signs of infection or spread of an infection to your blood.  Imaging studies such as ultrasound, CT scan, or MRI if the abscess is deep.  How is this treated? Small abscesses that drain on their own may not need treatment. Treatment for an abscess that does not rupture on its own may include:  Warm compresses applied to the area several times per day.  Incision and drainage. Your health care provider will make an incision to open the abscess and will remove pus and any foreign body or dead tissue. The incision area may be packed with gauze to keep it open for a few days while it heals.  Antibiotic medicines to treat infection. For a severe abscess, you may first get antibiotics through an IV and then change to oral antibiotics.  Follow these instructions at home: Abscess Care  If you have an abscess that has not drained, place a warm, clean, wet washcloth over the abscess several times a day. Do this as told by your health care provider.  Follow instructions from your health care provider about how to take care of your abscess. Make sure you: ? Cover the abscess with a bandage (dressing). ? Change your dressing or gauze as told by your health care provider. ? Wash your hands with soap and water before you change the dressing or gauze. If soap and water are not available, use hand sanitizer.  Check your abscess every day for signs of a worsening infection. Check for: ? More redness, swelling, or pain. ? More fluid or blood. ? Warmth. ? More pus or a bad  smell. Medicines  Take over-the-counter and prescription medicines only as told by your health care provider.  If you were prescribed an antibiotic medicine, take it as told by your health care provider. Do not stop taking the antibiotic even if you start to feel better. General instructions  To avoid spreading the infection: ? Do not share personal care items, towels, or hot tubs with others. ? Avoid making skin contact with other people.  Keep all follow-up visits as told by your health care provider. This is important. Contact a health care provider if:  You have more redness, swelling, or pain around your abscess.  You have more fluid or blood coming from your abscess.  Your abscess feels warm to the touch.  You have more pus or a bad smell coming from your abscess.  You have a fever.  You have muscle aches.  You have chills or a general ill feeling. Get help right away if:  You have severe pain.  You see red streaks on your skin spreading away from the abscess. This information is not intended to replace advice given to you by your health care provider. Make sure you discuss any questions you have with your health care provider. Document Released: 06/09/2005 Document Revised: 04/25/2016 Document Reviewed: 07/09/2015 Elsevier Interactive Patient Education  [employer].

## 2017-06-09 ENCOUNTER — Encounter: Payer: Medicaid Other | Admitting: Obstetrics and Gynecology

## 2017-06-10 ENCOUNTER — Ambulatory Visit: Payer: Medicaid Other | Admitting: Family Medicine

## 2017-06-13 ENCOUNTER — Ambulatory Visit: Payer: Medicaid Other | Admitting: Infectious Diseases

## 2017-06-14 NOTE — Progress Notes (Signed)
Subjective: HK:VQQV tissue infection follow up PCP: Dettinger, Fransisca Kaufmann, MD [id number] Nicole Silva is a 46 y.o. female presenting to clinic today for:  1. Soft tissue infection Patient was seen on 06/07/2017 for left-sided lower abdominal skin infection. She has a known history of HIV, chronic hepatitis Nicole and cervical cancer. On that exam, there was no evidence of systemic infection. Though, there was some concern for possible progression to deeper infection. She was started on oral doxycycline, given NSAID for pain and advised to return in 24-48 hours. Patient canceled her follow-up appointments because of lack of transportation. She presents to office today for reevaluation of soft tissue infection. She reports that symptoms improved greatly by Friday. She notes that is scabbed over. No further purulence or bleeding from lesion. She has one remaining tablet of doxycycline. No fevers, chills, nausea, vomiting. She is tolerating food and drink without difficulty. She plans on rescheduling her gynecologic surgery today.  Allergies  Allergen Reactions  . Latex Hives  . Peach [Prunus Persica]   . Penicillins     REACTION: hives   Past Medical History:  Diagnosis Date  . Anxiety   . Asthma   . Cancer ([place])    lymphoma  . Chronic hepatitis Nicole without hepatic coma ([place]) 01/29/2015  . Complication of anesthesia   . COPD exacerbation ([place]) 01/29/2015  . Depression   . H/O intravenous drug use in remission 01/29/2015  . Hep Nicole w/o coma, chronic ([place])   . HIV (human immunodeficiency virus infection) ([place]) 1998  . HIV disease ([place])   . 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 PONV (postoperative nausea and vomiting)   . PTSD (post-traumatic stress disorder)   . Shortness of breath    with exertion  . Smoker 01/29/2015   Family History  Problem Relation Age of Onset  . Diverticulitis Mother   . COPD Mother   . Alcohol abuse Father   .  Hypertension Father   . Schizophrenia Father   . Anesthesia problems Neg Hx   . Hypotension Neg Hx   . Malignant hyperthermia Neg Hx   . Pseudochol deficiency Neg Hx    Social Hx: active smoker.Current medications reviewed.    Health Maintenance: Flu shot   ROS: Per HPI  Objective: Office vital signs reviewed. BP 134/87   Pulse 83   Temp 97.7 F (36.5 Nicole) (Oral)   Ht 5\' 5"  (1.651 m)   Wt 252 lb (114.3 kg)   LMP 02/01/2012   BMI 41.93 kg/m   Physical Examination:  General: Awake, alert, obese, No acute distress Skin: 1 inch x 0.5 inch healing lesion with overlying scab. Very minimal erythema at the border of the lesion. No palpable induration or fluctuance. Lesion is nontender. No exudate or bleeding. No increased warmth.  Assessment/ Plan: 46 y.o. female   1. Soft tissue infection Lesion/infection resolving. She has 1 day left of antibiotics. I instructed her to finish off the antibiotic. I again reviewed signs and symptoms of recurrence or insufficiently treated infection. There is no evidence of a deeper seated infection on today's exam. The lesion appears to be well-healing. She'll follow up as needed.  2. Need for vaccination Flu shot administered today.   No orders of the defined types were placed in this encounter.  No orders of the defined types were placed in this encounter.    Janora Norlander, DO [place] [phone]

## 2017-06-16 ENCOUNTER — Telehealth: Payer: Self-pay | Admitting: Pharmacist

## 2017-06-16 NOTE — Telephone Encounter (Signed)
Received notification from [facility], [facility], that pharmacy spoke with patient and patient stated that the cancer doctor told her to stop Triumeq because she has cervical cancer.  She also stated that Dr. Johnnye Sima was aware of this.   In reviewing her records, it seems like she had a biopsy that showed pre-cancer and is scheduled for surgery.  She has no showed Dr. Johnnye Sima as well as pharmacy many times.   Will route to Dr. Johnnye Sima to see if he is aware.

## 2017-06-17 ENCOUNTER — Encounter: Payer: Self-pay | Admitting: Family Medicine

## 2017-06-17 ENCOUNTER — Ambulatory Visit (INDEPENDENT_AMBULATORY_CARE_PROVIDER_SITE_OTHER): Payer: Medicaid Other | Admitting: Family Medicine

## 2017-06-17 VITALS — BP 134/87 | HR 83 | Temp 97.7°F | Ht 65.0 in | Wt 252.0 lb

## 2017-06-17 DIAGNOSIS — L089 Local infection of the skin and subcutaneous tissue, unspecified: Secondary | ICD-10-CM

## 2017-06-17 DIAGNOSIS — Z23 Encounter for immunization: Secondary | ICD-10-CM

## 2017-06-17 MED FILL — TRIUMEQ 600-50-300 MG TABS: 600-50-300 | 30 days supply | Qty: 30 | Fill #7

## 2017-06-20 NOTE — Telephone Encounter (Signed)
Not aware and I would NOT stop her medication for this indication thanks

## 2017-06-21 ENCOUNTER — Other Ambulatory Visit: Payer: Self-pay | Admitting: Pharmacist

## 2017-06-21 NOTE — Telephone Encounter (Signed)
Called and spoke to patient. She stated she was recently diagnosed with HPV and had a biopsy.  She developed boils on her skin and had to be on antibiotics.  She stopped her Triumeq x 3 days while she was taking her antibiotics because it was making her sick. She states she knows to never stop taking her HIV medication.  She is having surgery on the 17th and will call (hopefully) for a follow-up after that.

## 2017-06-27 ENCOUNTER — Encounter: Payer: Medicaid Other | Admitting: Obstetrics and Gynecology

## 2017-06-29 ENCOUNTER — Encounter: Payer: Self-pay | Admitting: Obstetrics and Gynecology

## 2017-06-29 ENCOUNTER — Ambulatory Visit (INDEPENDENT_AMBULATORY_CARE_PROVIDER_SITE_OTHER): Payer: Medicaid Other | Admitting: Obstetrics and Gynecology

## 2017-06-29 VITALS — BP 130/80 | HR 105 | Wt 254.0 lb

## 2017-06-29 DIAGNOSIS — F3112 Bipolar disorder, current episode manic without psychotic features, moderate: Secondary | ICD-10-CM

## 2017-06-29 DIAGNOSIS — D071 Carcinoma in situ of vulva: Secondary | ICD-10-CM | POA: Diagnosis not present

## 2017-06-29 DIAGNOSIS — B182 Chronic viral hepatitis C: Secondary | ICD-10-CM

## 2017-06-29 DIAGNOSIS — Z21 Asymptomatic human immunodeficiency virus [HIV] infection status: Secondary | ICD-10-CM | POA: Diagnosis not present
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
## 2017-07-11 DIAGNOSIS — D071 Carcinoma in situ of vulva: Secondary | ICD-10-CM | POA: Insufficient documentation

## 2017-07-11 MED FILL — TRIUMEQ 600-50-300 MG TABS: 600-50-300 | 30 days supply | Qty: 30 | Fill #8

## 2017-07-12 ENCOUNTER — Telehealth: Payer: Self-pay | Admitting: *Deleted

## 2017-07-12 NOTE — Telephone Encounter (Signed)
Spoke to patient who states she has already had her infectious disease appointment. Will be here for pre-op on Thursday.

## 2017-07-14 ENCOUNTER — Other Ambulatory Visit: Payer: Self-pay | Admitting: Obstetrics and Gynecology

## 2017-07-14 ENCOUNTER — Ambulatory Visit (INDEPENDENT_AMBULATORY_CARE_PROVIDER_SITE_OTHER): Payer: Medicaid Other | Admitting: Obstetrics and Gynecology

## 2017-07-14 VITALS — BP 130/76 | HR 92 | Ht 65.5 in | Wt 254.0 lb

## 2017-07-14 DIAGNOSIS — Z01818 Encounter for other preprocedural examination: Secondary | ICD-10-CM

## 2017-07-14 DIAGNOSIS — F3181 Bipolar II disorder: Secondary | ICD-10-CM

## 2017-07-14 DIAGNOSIS — D071 Carcinoma in situ of vulva: Secondary | ICD-10-CM | POA: Diagnosis not present

## 2017-07-14 DIAGNOSIS — Z21 Asymptomatic human immunodeficiency virus [HIV] infection status: Secondary | ICD-10-CM | POA: Diagnosis not present

## 2017-07-14 DIAGNOSIS — B182 Chronic viral hepatitis C: Secondary | ICD-10-CM
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
## 2017-07-14 NOTE — Patient Instructions (Signed)
Nicole Silva  07/14/2017     @PREFPERIOPPHARMACY @   Your procedure is scheduled on  07/26/2017   Report to [facility] at  900   A.M.  Call this number if you have problems the morning of surgery:  [phone]   Remember:  Do not eat food or drink liquids after midnight.  Take these medicines the morning of surgery with A SIP OF WATER  Triumeq, klonopin, paxil, seroquel, vyvanse.   Do not wear jewelry, make-up or nail polish.  Do not wear lotions, powders, or perfumes, or deoderant.  Do not shave 48 hours prior to surgery.  Men may shave face and neck.  Do not bring valuables to the hospital.  [facility] is not responsible for any belongings or valuables.  Contacts, dentures or bridgework may not be worn into surgery.  Leave your suitcase in the car.  After surgery it may be brought to your room.  For patients admitted to the hospital, discharge time will be determined by your treatment team.  Patients discharged the day of surgery will not be allowed to drive home.   Name and phone number of your driver:   family Special instructions:  None Please read over the following fact sheets that you were given. Anesthesia Post-op Instructions and Care and Recovery After Surgery       Vulvectomy Vulvectomy is a surgical procedure to remove all or part of the outer female genital organs (vulva). The vulva includes the outer and inner lips of the vagina and the clitoris. You may need this surgery if you have a cancerous growth in your vulva. There are two types of vulvectomy:  A simple vulvectomy. This is the removal of the entire vulva.  A radical vulvectomy. A radical vulvectomy can be partial or complete. ? A partial radical vulvectomy is when part of the vulva and surrounding deep tissue is removed. ? A complete radical vulvectomy is when the vulva, clitoris, and surrounding deep tissue is removed.  During a radical vulvectomy, some lymph nodes near  the vulva may also be removed. Tell a health care provider about:  Any allergies you have.  All medicines you are taking, including vitamins, herbs, eye drops, creams, and over-the-counter medicines.  Any problems you or family members have had with anesthetic medicines.  Any blood disorders you have.  Any surgeries you have had.  Any medical conditions you have.  Whether you are pregnant or may be pregnant. What are the risks? Generally, this is a safe procedure. However, problems may occur, including:  Infection.  Bleeding.  Allergic reactions to medicines.  Damage to other structures or organs.  Urinary tract infections.  Lymphedema. This is when your legs swell after the removal of lymph nodes from your groin area.  Pain or decreased sexual pleasure when having sex.  Long-term vaginal swelling, tightness, numbness, or pain.  A blood clot that may travel to the lung (pulmonary embolism).  What happens before the procedure?  Follow instructions from your health care provider about eating or drinking restrictions.  Ask your health care provider about: ? Changing or stopping your regular medicines. This is especially important if you are taking diabetes medicines or blood thinners. ? Taking medicines such as aspirin and ibuprofen. These medicines can thin your blood. Do not take these medicines before your procedure if your health care provider instructs you not to.  Ask your health care provider how your surgical  site will be marked or identified.  You may be given antibiotic medicine to help prevent infection.  Plan to have someone take you home after the procedure.  If you will be going home right after the procedure, plan to have someone with you for 24 hours. What happens during the procedure?  To reduce your risk of infection: ? Your health care team will wash or sanitize their hands. ? Your skin will be washed with soap.  An IV tube will be inserted  into one of your veins.  You will be given one or more of the following: ? A medicine to help you relax (sedative). ? A medicine to make you fall asleep (general anesthetic). ? A medicine that is injected into your spine to numb the area below and slightly above the injection site (spinal anesthetic).  A tube (catheter) may be inserted through the outer opening of your bladder (urethra) to drain urine during and after surgery.  Depending on the type of vulvectomy you are having, your surgeon will make an incision and remove the affected area. This may include: ? Removing the entire vulva. ? Removing part of the vulva, surrounding deep tissue, and lymph nodes. ? Removing the vulva, clitoris, surrounding deep tissue, and lymph nodes.  If your lymph nodes are removed, a drain may be placed in the area to help avoid fluid buildup.  Your incisions will be closed and covered with a bandage (dressing). The procedure may vary among health care providers and hospitals. What happens after the procedure?  Your blood pressure, heart rate, breathing rate, and blood oxygen level will be monitored often until the medicines you were given have worn off.  You will get medicine for pain as needed.  You may get medicine to prevent constipation.  You may be on a liquid diet at first, and then switch to a regular diet.  When you are taking fluids well, your IV will be removed.  If your catheter was left in place after surgery, it will be removed when your health care provider approves.  You will be asked to breathe deeply and to get out of bed and walk as soon as you can.  You may have to wear compression stockings. These stockings help to prevent blood clots and reduce swelling in your legs.  Do not drive for 24 hours if you received a sedative. This information is not intended to replace advice given to you by your health care provider. Make sure you discuss any questions you have with your health  care provider. Document Released: 09/26/2015 Document Revised: 02/05/2016 Document Reviewed: 08/25/2015 Elsevier Interactive Patient Education  2018 [place] After Refer to this sheet in the next few weeks. These instructions provide you with information about caring for yourself after your procedure. Your health care provider may also give you more specific instructions. Your treatment has been planned according to current medical practices, but problems sometimes occur. Call your health care provider if you have any problems or questions after your procedure. What can I expect after the procedure? After the procedure, it is common to have:  Vaginal pain.  Vaginal numbness.  Vaginal swelling.  Bloody vaginal discharge.  Follow these instructions at home: Activity   Rest as told by your health care provider.  Do not lift, push, or pull more than 5 lb (2.3 kg).  Avoid activities that require a lot of energy for as long as told by your health care provider. This  includes any exercise.  Raise (elevate) your legs while sitting or lying down.  Avoid standing or sitting in one place for long periods of time.  Do not cross your legs, especially when sitting. Bathing  Do not take baths, swim, or use a hot tub until your health care provider approves. Ask your health care provider if you can take showers. You may only be allowed to take sponge baths for bathing.  After passing urine or a bowel movement, wipe yourself from front to back and clean your vaginal area using a spray bottle.  If told by your health care provider, take a sitz bath to help with discomfort. This is a warm water bath you take while sitting down. ? Do this 3-4 times per day, or as often as told by your health care provider. ? The water should only come up to your hips and cover your buttocks. ? You may pat the area dry with a soft, clean towel. If needed, you may then gently dry the area  with a hair dryer on a cool setting for 5-10 minutes. An enclosed box fan may also be used to gently dry the area. Incision care   Follow instructions from your health care provider about how to take care of your incision. Make sure you: ? Wash your hands with soap and water before you change your bandage (dressing). If soap and water are not available, use hand sanitizer. ? Change your dressing as told by your health care provider. ? Leave stitches (sutures), skin glue, adhesive strips, or surgical clips in place. These skin closures may need to stay in place for 2 weeks or longer. If adhesive strip edges start to loosen and curl up, you may trim the loose edges. Do not remove adhesive strips completely unless your health care provider tells you to do that.  Check your incision area every day for signs of infection. Check for: ? More redness, swelling, or pain. ? More fluid or blood. ? Warmth. ? Pus or a bad smell. Lifestyle  Do not douche or use tampons until your health care provider approves.  Do not have sex until your health care provider approves. Tell your health care provider if you have pain or numbness when you return to sexual activity.  Wear comfortable, loose-fitting clothing. Driving  Do not drive or operate heavy machinery while taking prescription pain medicine.  Do not drive for 24 hours if you received a medicine to help you relax (sedative). General instructions   Take over-the-counter and prescription medicines only as told by your health care provider.  Take a stool softener to help prevent constipation as told by your health care provider. You may need to do this if you are taking prescription pain medicine.  Drink enough fluid to keep your urine clear or pale yellow.  If you were sent home with a drain, take care of it as told by your health care provider.  Wear compression stockings as told by your health care provider. These stockings help to prevent  blood clots and reduce swelling in your legs.  Keep all follow-up visits as told by your health care provider. This is important. Contact a health care provider if:  You have more redness, swelling, or pain around your incision.  You have more fluid or blood coming from your incision.  Your incision feels warm to the touch.  You have pus or a bad smell coming from your incision.  You have a fever.  You  have painful or bloody urination.  You feel nauseous or you vomit.  You develop diarrhea.  You develop constipation.  You develop a rash.  You feel dizzy or light-headed.  You have pain that does not get better with medicine.  Your incision breaks open. Get help right away if:  You faint.  You develop leg or chest pain.  You develop abdominal pain.  You develop shortness of breath. This information is not intended to replace advice given to you by your health care provider. Make sure you discuss any questions you have with your health care provider. Document Released: 04/13/2004 Document Revised: 02/05/2016 Document Reviewed: 08/25/2015 Elsevier Interactive Patient Education  2018 [place] Anesthesia, Adult General anesthesia is the use of medicines to make a person "go to sleep" (be unconscious) for a medical procedure. General anesthesia is often recommended when a procedure:  Is long.  Requires you to be still or in an unusual position.  Is major and can cause you to lose blood.  Is impossible to do without general anesthesia.  The medicines used for general anesthesia are called general anesthetics. In addition to making you sleep, the medicines:  Prevent pain.  Control your blood pressure.  Relax your muscles.  Tell a health care provider about:  Any allergies you have.  All medicines you are taking, including vitamins, herbs, eye drops, creams, and over-the-counter medicines.  Any problems you or family members have had with  anesthetic medicines.  Types of anesthetics you have had in the past.  Any bleeding disorders you have.  Any surgeries you have had.  Any medical conditions you have.  Any history of heart or lung conditions, such as heart failure, sleep apnea, or chronic obstructive pulmonary disease (COPD).  Whether you are pregnant or may be pregnant.  Whether you use tobacco, alcohol, marijuana, or street drugs.  Any history of Armed forces logistics/support/administrative officer.  Any history of depression or anxiety. What are the risks? Generally, this is a safe procedure. However, problems may occur, including:  Allergic reaction to anesthetics.  Lung and heart problems.  Inhaling food or liquids from your stomach into your lungs (aspiration).  Injury to nerves.  Waking up during your procedure and being unable to move (rare).  Extreme agitation or a state of mental confusion (delirium) when you wake up from the anesthetic.  Air in the bloodstream, which can lead to stroke.  These problems are more likely to develop if you are having a major surgery or if you have an advanced medical condition. You can prevent some of these complications by answering all of your health care provider's questions thoroughly and by following all pre-procedure instructions. General anesthesia can cause side effects, including:  Nausea or vomiting  A sore throat from the breathing tube.  Feeling cold or shivery.  Feeling tired, washed out, or achy.  Sleepiness or drowsiness.  Confusion or agitation.  What happens before the procedure? Staying hydrated Follow instructions from your health care provider about hydration, which may include:  Up to 2 hours before the procedure - you may continue to drink clear liquids, such as water, clear fruit juice, black coffee, and plain tea.  Eating and drinking restrictions Follow instructions from your health care provider about eating and drinking, which may include:  8 hours before the  procedure - stop eating heavy meals or foods such as meat, fried foods, or fatty foods.  6 hours before the procedure - stop eating light meals or foods,  such as toast or cereal.  6 hours before the procedure - stop drinking milk or drinks that contain milk.  2 hours before the procedure - stop drinking clear liquids.  Medicines  Ask your health care provider about: ? Changing or stopping your regular medicines. This is especially important if you are taking diabetes medicines or blood thinners. ? Taking medicines such as aspirin and ibuprofen. These medicines can thin your blood. Do not take these medicines before your procedure if your health care provider instructs you not to. ? Taking new dietary supplements or medicines. Do not take these during the week before your procedure unless your health care provider approves them.  If you are told to take a medicine or to continue taking a medicine on the day of the procedure, take the medicine with sips of water. General instructions   Ask if you will be going home the same day, the following day, or after a longer hospital stay. ? Plan to have someone take you home. ? Plan to have someone stay with you for the first 24 hours after you leave the hospital or clinic.  For 3-6 weeks before the procedure, try not to use any tobacco products, such as cigarettes, chewing tobacco, and e-cigarettes.  You may brush your teeth on the morning of the procedure, but make sure to spit out the toothpaste. What happens during the procedure?  You will be given anesthetics through a mask and through an IV tube in one of your veins.  You may receive medicine to help you relax (sedative).  As soon as you are asleep, a breathing tube may be used to help you breathe.  An anesthesia specialist will stay with you throughout the procedure. He or she will help keep you comfortable and safe by continuing to give you medicines and adjusting the amount of  medicine that you get. He or she will also watch your blood pressure, pulse, and oxygen levels to make sure that the anesthetics do not cause any problems.  If a breathing tube was used to help you breathe, it will be removed before you wake up. The procedure may vary among health care providers and hospitals. What happens after the procedure?  You will wake up, often slowly, after the procedure is complete, usually in a recovery area.  Your blood pressure, heart rate, breathing rate, and blood oxygen level will be monitored until the medicines you were given have worn off.  You may be given medicine to help you calm down if you feel anxious or agitated.  If you will be going home the same day, your health care provider may check to make sure you can stand, drink, and urinate.  Your health care providers will treat your pain and side effects before you go home.  Do not drive for 24 hours if you received a sedative.  You may: ? Feel nauseous and vomit. ? Have a sore throat. ? Have mental slowness. ? Feel cold or shivery. ? Feel sleepy. ? Feel tired. ? Feel sore or achy, even in parts of your body where you did not have surgery. This information is not intended to replace advice given to you by your health care provider. Make sure you discuss any questions you have with your health care provider. Document Released: 12/07/2007 Document Revised: 02/10/2016 Document Reviewed: 08/14/2015 Elsevier Interactive Patient Education  2018 [place] Anesthesia, Adult, Care After These instructions provide you with information about caring for yourself after your  procedure. Your health care provider may also give you more specific instructions. Your treatment has been planned according to current medical practices, but problems sometimes occur. Call your health care provider if you have any problems or questions after your procedure. What can I expect after the procedure? After the  procedure, it is common to have:  Vomiting.  A sore throat.  Mental slowness.  It is common to feel:  Nauseous.  Cold or shivery.  Sleepy.  Tired.  Sore or achy, even in parts of your body where you did not have surgery.  Follow these instructions at home: For at least 24 hours after the procedure:  Do not: ? Participate in activities where you could fall or become injured. ? Drive. ? Use heavy machinery. ? Drink alcohol. ? Take sleeping pills or medicines that cause drowsiness. ? Make important decisions or sign legal documents. ? Take care of children on your own.  Rest. Eating and drinking  If you vomit, drink water, juice, or soup when you can drink without vomiting.  Drink enough fluid to keep your urine clear or pale yellow.  Make sure you have little or no nausea before eating solid foods.  Follow the diet recommended by your health care provider. General instructions  Have a responsible adult stay with you until you are awake and alert.  Return to your normal activities as told by your health care provider. Ask your health care provider what activities are safe for you.  Take over-the-counter and prescription medicines only as told by your health care provider.  If you smoke, do not smoke without supervision.  Keep all follow-up visits as told by your health care provider. This is important. Contact a health care provider if:  You continue to have nausea or vomiting at home, and medicines are not helpful.  You cannot drink fluids or start eating again.  You cannot urinate after 8-12 hours.  You develop a skin rash.  You have fever.  You have increasing redness at the site of your procedure. Get help right away if:  You have difficulty breathing.  You have chest pain.  You have unexpected bleeding.  You feel that you are having a life-threatening or urgent problem. This information is not intended to replace advice given to you by your  health care provider. Make sure you discuss any questions you have with your health care provider. Document Released: 12/06/2000 Document Revised: 02/02/2016 Document Reviewed: 08/14/2015 Elsevier Interactive Patient Education  [employer].

## 2017-07-14 NOTE — Telephone Encounter (Signed)
Seen today. 

## 2017-07-14 NOTE — Addendum Note (Signed)
Addended by: Christiana Pellant A on: 07/14/2017 04:22 PM   Modules accepted: Orders

## 2017-07-21 ENCOUNTER — Encounter (HOSPITAL_COMMUNITY)
Admission: RE | Admit: 2017-07-21 | Discharge: 2017-07-21 | Disposition: A | Discharge status: Home / Self Care | Payer: Medicaid Other | Source: Ambulatory Visit | Attending: Obstetrics and Gynecology | Admitting: Obstetrics and Gynecology

## 2017-07-21 ENCOUNTER — Other Ambulatory Visit: Payer: Self-pay

## 2017-07-21 ENCOUNTER — Encounter (HOSPITAL_COMMUNITY): Payer: Self-pay

## 2017-07-21 DIAGNOSIS — R9431 Abnormal electrocardiogram [ECG] [EKG]: Secondary | ICD-10-CM | POA: Insufficient documentation

## 2017-07-21 DIAGNOSIS — Z01812 Encounter for preprocedural laboratory examination: Secondary | ICD-10-CM | POA: Diagnosis not present

## 2017-07-21 DIAGNOSIS — Z01818 Encounter for other preprocedural examination: Secondary | ICD-10-CM | POA: Diagnosis not present

## 2017-07-21 LAB — COMPREHENSIVE METABOLIC PANEL
ALT: 23 U/L (ref 14–54)
AST: 27 U/L (ref 15–41)
Albumin: 3.7 g/dL (ref 3.5–5.0)
Alkaline Phosphatase: 72 U/L (ref 38–126)
Anion gap: 11 (ref 5–15)
BUN: 13 mg/dL (ref 6–20)
CO2: 22 mmol/L (ref 22–32)
Calcium: 8.8 mg/dL — ABNORMAL LOW (ref 8.9–10.3)
Chloride: 99 mmol/L — ABNORMAL LOW (ref 101–111)
Creatinine, Ser: 0.73 mg/dL (ref 0.44–1.00)
GFR calc Af Amer: >60 mL/min (ref >60)
GFR calc non Af Amer: >60 mL/min (ref >60)
Glucose, Bld: 116 mg/dL — ABNORMAL HIGH (ref 65–99)
Potassium: 3.8 mmol/L (ref 3.5–5.1)
Sodium: 132 mmol/L — ABNORMAL LOW (ref 135–145)
Total Bilirubin: 0.4 mg/dL (ref 0.3–1.2)
Total Protein: 7.9 g/dL (ref 6.5–8.1)

## 2017-07-21 LAB — CBC
HCT: 39.4 % (ref 36.0–46.0)
Hemoglobin: 13.6 g/dL (ref 12.0–15.0)
MCH: 35.3 pg — ABNORMAL HIGH (ref 26.0–34.0)
MCHC: 34.5 g/dL (ref 30.0–36.0)
MCV: 102.3 fL — ABNORMAL HIGH (ref 78.0–100.0)
Platelets: 214 10*3/uL (ref 150–400)
RBC: 3.85 MIL/uL — ABNORMAL LOW (ref 3.87–5.11)
RDW: 14.8 % (ref 11.5–15.5)
WBC: 5.7 10*3/uL (ref 4.0–10.5)

## 2017-07-21 LAB — HCG, SERUM, QUALITATIVE: Preg, Serum: NEGATIVE

## 2017-07-26 ENCOUNTER — Inpatient Hospital Stay (HOSPITAL_COMMUNITY): Payer: Medicaid Other | Admitting: Anesthesiology

## 2017-07-26 ENCOUNTER — Encounter (HOSPITAL_COMMUNITY): Payer: Self-pay | Admitting: *Deleted

## 2017-07-26 ENCOUNTER — Encounter (HOSPITAL_COMMUNITY)
Admission: RE | Disposition: A | Discharge status: Home / Self Care | Payer: Self-pay | Source: Ambulatory Visit | Attending: Obstetrics and Gynecology

## 2017-07-26 ENCOUNTER — Ambulatory Visit (HOSPITAL_COMMUNITY)
Admission: RE | Admit: 2017-07-26 | Discharge: 2017-07-26 | Disposition: A | Discharge status: Home / Self Care | Payer: Medicaid Other | Source: Ambulatory Visit | Attending: Obstetrics and Gynecology | Admitting: Obstetrics and Gynecology

## 2017-07-26 DIAGNOSIS — F419 Anxiety disorder, unspecified: Secondary | ICD-10-CM | POA: Insufficient documentation

## 2017-07-26 DIAGNOSIS — D069 Carcinoma in situ of cervix, unspecified: Secondary | ICD-10-CM | POA: Insufficient documentation

## 2017-07-26 DIAGNOSIS — F17200 Nicotine dependence, unspecified, uncomplicated: Secondary | ICD-10-CM | POA: Insufficient documentation

## 2017-07-26 DIAGNOSIS — F319 Bipolar disorder, unspecified: Secondary | ICD-10-CM | POA: Diagnosis not present

## 2017-07-26 DIAGNOSIS — Z79899 Other long term (current) drug therapy: Secondary | ICD-10-CM | POA: Insufficient documentation

## 2017-07-26 DIAGNOSIS — D072 Carcinoma in situ of vagina: Secondary | ICD-10-CM | POA: Diagnosis not present

## 2017-07-26 DIAGNOSIS — J449 Chronic obstructive pulmonary disease, unspecified: Secondary | ICD-10-CM | POA: Diagnosis not present

## 2017-07-26 DIAGNOSIS — D071 Carcinoma in situ of vulva: Secondary | ICD-10-CM | POA: Diagnosis not present

## 2017-07-26 DIAGNOSIS — Z880 Allergy status to penicillin: Secondary | ICD-10-CM | POA: Insufficient documentation

## 2017-07-26 DIAGNOSIS — B1920 Unspecified viral hepatitis C without hepatic coma: Secondary | ICD-10-CM | POA: Diagnosis not present

## 2017-07-26 DIAGNOSIS — Z21 Asymptomatic human immunodeficiency virus [HIV] infection status: Secondary | ICD-10-CM | POA: Diagnosis not present

## 2017-07-26 HISTORY — PX: VULVECTOMY PARTIAL: SHX6187

## 2017-07-26 SURGERY — VULVECTOMY, PARTIAL
Anesthesia: General

## 2017-07-26 MED ORDER — HYDROMORPHONE HCL 1 MG/ML IJ SOLN: 0.2500 mg | INTRAMUSCULAR | Status: DC | PRN

## 2017-07-26 MED ORDER — ROCURONIUM 10MG/ML (10ML) SYRINGE FOR MEDFUSION PUMP - OPTIME
INTRAVENOUS | Status: DC | PRN
  Administered 2017-07-26: 15 mg via INTRAVENOUS

## 2017-07-26 MED ORDER — SUCCINYLCHOLINE 20MG/ML (10ML) SYRINGE FOR MEDFUSION PUMP - OPTIME
INTRAMUSCULAR | Status: DC | PRN
  Administered 2017-07-26: 120 mg via INTRAVENOUS

## 2017-07-26 MED ORDER — MIDAZOLAM HCL 2 MG/2ML IJ SOLN
1.0000 mg | INTRAMUSCULAR | Status: AC
  Administered 2017-07-26 (×2): 2 mg via INTRAVENOUS
  Filled 2017-07-26: qty 2

## 2017-07-26 MED ORDER — OXYCODONE-ACETAMINOPHEN 5-325 MG PO TABS: 1.0000 | ORAL_TABLET | Freq: Four times a day (QID) | ORAL | 0 refills | Status: DC | PRN

## 2017-07-26 MED ORDER — LACTATED RINGERS IV SOLN
INTRAVENOUS | Status: DC
  Administered 2017-07-26 (×2): via INTRAVENOUS

## 2017-07-26 MED ORDER — ONDANSETRON HCL 4 MG/2ML IJ SOLN
INTRAMUSCULAR | Status: AC
  Filled 2017-07-26: qty 2

## 2017-07-26 MED ORDER — FENTANYL CITRATE (PF) 100 MCG/2ML IJ SOLN
INTRAMUSCULAR | Status: DC | PRN
  Administered 2017-07-26: 50 ug via INTRAVENOUS
  Administered 2017-07-26: 25 ug via INTRAVENOUS
  Administered 2017-07-26 (×3): 50 ug via INTRAVENOUS
  Administered 2017-07-26: 25 ug via INTRAVENOUS

## 2017-07-26 MED ORDER — MIDAZOLAM HCL 2 MG/2ML IJ SOLN
INTRAMUSCULAR | Status: AC
  Filled 2017-07-26: qty 2

## 2017-07-26 MED ORDER — CEFAZOLIN SODIUM-DEXTROSE 2-4 GM/100ML-% IV SOLN
2.0000 g | INTRAVENOUS | Status: AC
  Administered 2017-07-26: 2 g via INTRAVENOUS
  Filled 2017-07-26: qty 100

## 2017-07-26 MED ORDER — FENTANYL CITRATE (PF) 250 MCG/5ML IJ SOLN
INTRAMUSCULAR | Status: AC
  Filled 2017-07-26: qty 5

## 2017-07-26 MED ORDER — SUCCINYLCHOLINE CHLORIDE 20 MG/ML IJ SOLN
INTRAMUSCULAR | Status: AC
  Filled 2017-07-26: qty 1

## 2017-07-26 MED ORDER — LIDOCAINE HCL (PF) 1 % IJ SOLN
INTRAMUSCULAR | Status: AC
  Filled 2017-07-26: qty 5

## 2017-07-26 MED ORDER — ONDANSETRON HCL 4 MG/2ML IJ SOLN
4.0000 mg | Freq: Once | INTRAMUSCULAR | Status: AC
  Administered 2017-07-26: 4 mg via INTRAVENOUS

## 2017-07-26 MED ORDER — BUPIVACAINE-EPINEPHRINE (PF) 0.5% -1:200000 IJ SOLN
INTRAMUSCULAR | Status: AC
  Filled 2017-07-26: qty 30

## 2017-07-26 MED ORDER — IPRATROPIUM-ALBUTEROL 0.5-2.5 (3) MG/3ML IN SOLN
3.0000 mL | Freq: Once | RESPIRATORY_TRACT | Status: AC
  Administered 2017-07-26: 3 mL via RESPIRATORY_TRACT

## 2017-07-26 MED ORDER — PROPOFOL 10 MG/ML IV BOLUS
INTRAVENOUS | Status: DC | PRN
  Administered 2017-07-26: 60 mg via INTRAVENOUS
  Administered 2017-07-26: 180 mg via INTRAVENOUS
  Administered 2017-07-26: 50 mg via INTRAVENOUS

## 2017-07-26 MED ORDER — LIDOCAINE HCL (CARDIAC) 10 MG/ML IV SOLN
INTRAVENOUS | Status: DC | PRN
  Administered 2017-07-26: 40 mg via INTRAVENOUS

## 2017-07-26 MED ORDER — IPRATROPIUM-ALBUTEROL 0.5-2.5 (3) MG/3ML IN SOLN
RESPIRATORY_TRACT | Status: AC
  Filled 2017-07-26: qty 3

## 2017-07-26 MED ORDER — PROPOFOL 10 MG/ML IV BOLUS
INTRAVENOUS | Status: AC
  Filled 2017-07-26: qty 20

## 2017-07-26 MED ORDER — GLYCOPYRROLATE 0.2 MG/ML IJ SOLN
INTRAMUSCULAR | Status: AC
  Filled 2017-07-26: qty 3

## 2017-07-26 MED ORDER — ROCURONIUM BROMIDE 50 MG/5ML IV SOLN
INTRAVENOUS | Status: AC
  Filled 2017-07-26: qty 1

## 2017-07-26 MED ORDER — 0.9 % SODIUM CHLORIDE (POUR BTL) OPTIME
TOPICAL | Status: DC | PRN
  Administered 2017-07-26: 1000 mL

## 2017-07-26 MED ORDER — BUPIVACAINE-EPINEPHRINE (PF) 0.5% -1:200000 IJ SOLN
INTRAMUSCULAR | Status: DC | PRN
Start: 2017-07-26 — End: 2017-07-26
  Administered 2017-07-26: 20 mL

## 2017-07-26 SURGICAL SUPPLY — 33 items
BAG HAMPER (MISCELLANEOUS) ×2 IMPLANT
CLOTH BEACON ORANGE TIMEOUT ST (SAFETY) ×2 IMPLANT
COVER LIGHT HANDLE STERIS (MISCELLANEOUS) ×4 IMPLANT
DECANTER SPIKE VIAL GLASS SM (MISCELLANEOUS) ×2 IMPLANT
ELECT REM PT RETURN 9FT ADLT (ELECTROSURGICAL) ×2
ELECTRODE REM PT RTRN 9FT ADLT (ELECTROSURGICAL) ×1 IMPLANT
FORMALIN 10 PREFIL 120ML (MISCELLANEOUS) ×1 IMPLANT
GLOVE BIOGEL PI IND STRL 6.5 (GLOVE) IMPLANT
GLOVE BIOGEL PI IND STRL 7.0 (GLOVE) ×1 IMPLANT
GLOVE BIOGEL PI IND STRL 9 (GLOVE) ×1 IMPLANT
GLOVE BIOGEL PI INDICATOR 6.5 (GLOVE) ×1
GLOVE BIOGEL PI INDICATOR 7.0 (GLOVE) ×1
GLOVE BIOGEL PI INDICATOR 9 (GLOVE) ×1
GLOVE ECLIPSE 9.0 STRL (GLOVE) ×2 IMPLANT
GLOVE SURG SS PI 6.5 STRL IVOR (GLOVE) ×1 IMPLANT
GOWN SPEC L3 XXLG W/TWL (GOWN DISPOSABLE) ×3 IMPLANT
GOWN STRL REUS W/TWL LRG LVL3 (GOWN DISPOSABLE) ×4 IMPLANT
KIT ROOM TURNOVER AP CYSTO (KITS) ×2 IMPLANT
MANIFOLD NEPTUNE II (INSTRUMENTS) ×2 IMPLANT
NDL HYPO 25X1 1.5 SAFETY (NEEDLE) ×1 IMPLANT
NEEDLE HYPO 25X1 1.5 SAFETY (NEEDLE) ×2 IMPLANT
NS IRRIG 1000ML POUR BTL (IV SOLUTION) ×2 IMPLANT
PACK PERI GYN (CUSTOM PROCEDURE TRAY) ×2 IMPLANT
PAD ARMBOARD 7.5X6 YLW CONV (MISCELLANEOUS) ×2 IMPLANT
PAD TELFA 3X4 1S STER (GAUZE/BANDAGES/DRESSINGS) ×1 IMPLANT
SET BASIN LINEN APH (SET/KITS/TRAYS/PACK) ×2 IMPLANT
SUT CHROMIC 3 0 SH 27 (SUTURE) ×1 IMPLANT
SUT VIC AB 2-0 CT1 27 (SUTURE) ×2
SUT VIC AB 2-0 CT1 TAPERPNT 27 (SUTURE) IMPLANT
SUT VIC AB 2-0 CT2 27 (SUTURE) ×1 IMPLANT
SUT VICRYL AB 3-0 FS1 BRD 27IN (SUTURE) ×2 IMPLANT
SYR CONTROL 10ML LL (SYRINGE) ×2 IMPLANT
TRAY FOLEY BAG SILVER LF 16FR (SET/KITS/TRAYS/PACK) ×1 IMPLANT

## 2017-07-26 NOTE — Anesthesia Procedure Notes (Addendum)
Procedure Name: LMA Insertion Date/Time: 07/26/2017 10:21 AM Performed by: Ollen Bowl, CRNA Pre-anesthesia Checklist: Patient identified, Patient being monitored, Emergency Drugs available, Timeout performed and Suction available Patient Re-evaluated:Patient Re-evaluated prior to induction Oxygen Delivery Method: Circle System Utilized Preoxygenation: Pre-oxygenation with 100% oxygen Induction Type: IV induction Ventilation: Mask ventilation without difficulty LMA: LMA inserted LMA Size: 4.0 Number of attempts: 1 Placement Confirmation: positive ETCO2 and breath sounds checked- equal and bilateral

## 2017-07-26 NOTE — Anesthesia Procedure Notes (Signed)
Procedure Name: Intubation Date/Time: 07/26/2017 10:41 AM Performed by: Ollen Bowl, CRNA Pre-anesthesia Checklist: Patient identified, Patient being monitored, Timeout performed, Emergency Drugs available and Suction available Patient Re-evaluated:Patient Re-evaluated prior to induction Oxygen Delivery Method: Circle system utilized Preoxygenation: Pre-oxygenation with 100% oxygen Induction Type: IV induction Ventilation: Mask ventilation without difficulty Laryngoscope Size: Mac and 3 Grade View: Grade I Tube type: Oral Tube size: 7.0 mm Number of attempts: 1 Airway Equipment and Method: Stylet Placement Confirmation: ETT inserted through vocal cords under direct vision,  positive ETCO2 and breath sounds checked- equal and bilateral Secured at: 21 cm Tube secured with: Tape Dental Injury: Teeth and Oropharynx as per pre-operative assessment

## 2017-07-26 NOTE — Anesthesia Postprocedure Evaluation (Signed)
Anesthesia Post Note  Patient: NAAVA JANEWAY  Procedure(s) Performed: WIDE EXCISON OF VULVAR LESION (PARTIAL VULVECTOMY) (N/A )  Patient location during evaluation: PACU Anesthesia Type: General Level of consciousness: awake and alert and oriented Pain management: pain level controlled Vital Signs Assessment: post-procedure vital signs reviewed and stable Respiratory status: spontaneous breathing Cardiovascular status: blood pressure returned to baseline and stable Postop Assessment: no apparent nausea or vomiting Anesthetic complications: no     Last Vitals:  Vitals:   07/26/17 1200 07/26/17 1215  BP: 108/68 101/65  Pulse: 93 86  Resp: (!) 29 (!) 22  Temp:    SpO2: 93% 93%    Last Pain:  Vitals:   07/26/17 1215  TempSrc:   PainSc: 0-No pain                 Galdino Hinchman

## 2017-07-26 NOTE — Anesthesia Preprocedure Evaluation (Signed)
Anesthesia Evaluation  Patient identified by MRN, date of birth, ID band Patient awake    Reviewed: Allergy & Precautions, NPO status , Patient's Chart, lab work & pertinent test results  History of Anesthesia Complications (+) PONV and history of anesthetic complications  Airway Mallampati: I  TM Distance: >3 FB     Dental  (+) Partial Lower, Partial Upper   Pulmonary shortness of breath, asthma , COPD, Current Smoker,    breath sounds clear to auscultation       Cardiovascular negative cardio ROS   Rhythm:Regular Rate:Normal     Neuro/Psych PSYCHIATRIC DISORDERS Anxiety Depression Bipolar Disorder    GI/Hepatic negative GI ROS, (+) Hepatitis -, C  Endo/Other    Renal/GU      Musculoskeletal   Abdominal   Peds  Hematology   Anesthesia Other Findings HIV positive  Reproductive/Obstetrics                             Anesthesia Physical Anesthesia Plan  ASA: III  Anesthesia Plan: General   Post-op Pain Management:    Induction: Intravenous  PONV Risk Score and Plan:   Airway Management Planned: LMA  Additional Equipment:   Intra-op Plan:   Post-operative Plan: Extubation in OR  Informed Consent: I have reviewed the patients History and Physical, chart, labs and discussed the procedure including the risks, benefits and alternatives for the proposed anesthesia with the patient or authorized representative who has indicated his/her understanding and acceptance.     Plan Discussed with:   Anesthesia Plan Comments:         Anesthesia Quick Evaluation

## 2017-07-26 NOTE — Transfer of Care (Signed)
Immediate Anesthesia Transfer of Care Note  Patient: Nicole Silva  Procedure(s) Performed: WIDE EXCISON OF VULVAR LESION (PARTIAL VULVECTOMY) (N/A )  Patient Location: PACU  Anesthesia Type:General  Level of Consciousness: awake, alert  and oriented  Airway & Oxygen Therapy: Patient Spontanous Breathing and Patient connected to face mask oxygen  Post-op Assessment: Report given to RN  Post vital signs: Reviewed and stable  Last Vitals:  Vitals:   07/26/17 1000 07/26/17 1005  BP: 130/81 139/90  Pulse:    Resp: 20 (!) 29  Temp:    SpO2: 97% 98%    Last Pain:  Vitals:   07/26/17 0930  TempSrc: Oral  PainSc: 4       Patients Stated Pain Goal: 4 (54/62/70 3500)  Complications: No apparent anesthesia complications

## 2017-07-26 NOTE — Brief Op Note (Signed)
07/26/2017  11:43 AM  PATIENT:  Nicole Silva  46 y.o. 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   Delay start of Pharmacological VTE agent (>24hrs) due to surgical blood loss or risk of bleeding: not applicable Details of procedure: Patient was taken operating room prepped and draped for vaginal procedure with legs in yellow fin leg support. Timeout was conducted. Ancef was administered. The area of thin 4 over dysplasia was marked with a skin marker and procedure consisted of an tissue from 4:00 around the 8:00 of the posterior fourchette including an area on the patient right side where an old cyst had been opened. The entire bed of the cyst was irregular and was removed as part of the specimen. The specimen was sharply dissected off beginning on the external edges and beginning the patient left side. We dissected underneath tissue giving full-thickness removal of the skin. Specimen was removed almost entirely intact but overall on the patient's right side at 8 to 9:00 there was disruption of the specimen which required further excision of the right labium minora up to around 9:00 . There was generous point  bleeding throughout the surgical bed. On the patient's left side we were able to remove a little bit of the fatty tissue underlying it to allow for better reapproximation. 4:00 to 5:00 on the patient's left side and from 7:00 to 9:00 on the patient right side were able to reapproximate from the vaginal epithelium to the external genitalia epithelium. The perineal bed was closed in a mattress similar to an old episiotomy repair with 2 layers of interrupted 2-0 Vicryl suture followed by interrupted 3-0 Vicryl subcuticular skin edge closure with acceptable satisfactory tissue approximation achieved EBL was 2 50 cc due to the generous bleeding tendencies Foley catheter was inserted at the end of the procedure draining clear urine. The patient urinated at start of the case with Valsalva maneuver. We will discontinue the Foley if the patient's able to go home
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
## 2017-07-26 NOTE — Op Note (Signed)
Please see the brief operative note

## 2017-07-26 NOTE — Discharge Instructions (Signed)
Vulva Biopsy  A vulva biopsy is a procedure to remove a small sample of tissue from the vulva. The vulva is the outside part of the female genitals. The vulva includes the outside folds of skin (labia majora), the inner lips (labia minora), the clitoris, and the openings of the urethra and vagina.  You may have this procedure to get more information about or diagnose a lesion, growth, rash, blister, or some other unusual discoloration. This procedure may also be done to remove a mole or wart.  Tell a health care provider about:   Any allergies you have.   All medicines you are taking, including vitamins, herbs, eye drops, creams, and over-the-counter medicines.   Any problems you or family members have had with anesthetic medicines.   Any blood disorders you have.   Any surgeries you have had.   Any medical conditions you have.   Whether you are pregnant or may be pregnant.  What are the risks?  Generally, this is a safe procedure. However, problems may occur, including:   Infection.   Bleeding.   Allergic reactions to medicines.   Damage to other structures or organs.   Pain at the biopsy site.    What happens before the procedure?   Wear loose and comfortable pants and underwear for the procedure.   Follow instructions from your health care provider about eating or drinking restrictions.   Ask your health care provider about:  ? Changing or stopping your regular medicines. This is especially important if you are taking diabetes medicines or blood thinners.  ? Taking medicines such as aspirin and ibuprofen. These medicines can thin your blood. Do not take these medicines before your procedure if your health care provider instructs you not to.   Ask your health care provider how your surgical site will be marked or identified.   You may be given antibiotic medicine to help prevent infection.  What happens during the procedure?   To reduce your risk of infection:  ? Your health care team will wash  or sanitize their hands.  ? Your skin will be washed with soap.   You will be given a medicine to numb the area (local anesthetic).   A small tissue sample will be removed (excised). This sample may be sent for further examination depending on why you are having a biopsy.   A medicine may be applied to the biopsy site to help stop the bleeding.   The biopsy site may be closed with stitches (sutures).  The procedure may vary among health care providers and hospitals.  What happens after the procedure?   You may be given pain medicine.   If the sample is being sent for testing, it is your responsibility to get the results of your procedure. Ask your health care provider or the department performing the procedure when your results will be ready.   You may be given antibiotic ointment medicine.  This information is not intended to replace advice given to you by your health care provider. Make sure you discuss any questions you have with your health care provider.  Document Released: 08/16/2012 Document Revised: 02/11/2016 Document Reviewed: 07/21/2015  Elsevier Interactive Patient Education  2018 [employer]

## 2017-07-27 ENCOUNTER — Encounter (HOSPITAL_COMMUNITY): Payer: Self-pay | Admitting: Obstetrics and Gynecology

## 2017-07-27 NOTE — Anesthesia Postprocedure Evaluation (Signed)
Anesthesia Post Note  Patient: Nicole Silva  Procedure(s) Performed: WIDE EXCISON OF VULVAR LESION (PARTIAL VULVECTOMY) (N/A )  Patient location during evaluation: Nursing Unit Anesthesia Type: General Level of consciousness: awake and alert, oriented and awake Pain management: pain level controlled Vital Signs Assessment: post-procedure vital signs reviewed and stable Respiratory status: spontaneous breathing, nonlabored ventilation and respiratory function stable Cardiovascular status: stable Postop Assessment: no apparent nausea or vomiting Anesthetic complications: no     Last Vitals:  Vitals:   07/26/17 1230 07/26/17 1254  BP: 118/69 (!) 130/93  Pulse: 82 82  Resp: (!) 22 16  Temp:  36.6 C  SpO2: 95% 96%    Last Pain:  Vitals:   07/26/17 1254  TempSrc: Oral  PainSc:                  Weslie Rasmus L

## 2017-07-27 NOTE — Addendum Note (Signed)
Addendum  created 07/27/17 1318 by Raenette Rover, CRNA   Sign clinical note

## 2017-08-01 ENCOUNTER — Telehealth: Payer: Self-pay | Admitting: *Deleted

## 2017-08-01 ENCOUNTER — Telehealth: Payer: Self-pay | Admitting: Obstetrics & Gynecology

## 2017-08-01 MED ORDER — CIPROFLOXACIN HCL 500 MG PO TABS: 500.0000 mg | ORAL_TABLET | Freq: Two times a day (BID) | ORAL | 0 refills | Status: DC

## 2017-08-01 NOTE — Telephone Encounter (Signed)
E prescribed cipro for patient

## 2017-08-01 NOTE — Telephone Encounter (Signed)
Patient called with complaints of severe burning with urination. Had surgery 11/13 by Dr Glo Herring and thinks she has an UTI. Please advise.

## 2017-08-02 NOTE — Telephone Encounter (Signed)
Informed patient Cipro was sent to pharmacy. Verbalized understanding.

## 2017-08-02 NOTE — Discharge Summary (Signed)
Physician Discharge Summary  Patient ID: Nicole Silva MRN: [id number] DOB/AGE: Nov 24, 1970 46 y.o.  Admit date: 07/26/2017 Discharge date: 08/02/2017  Admission Diagnoses:vulvar carcinoma in situ (VIN III) Discharge Diagnoses: CIN-3 Active Problems:   * No active hospital problems. * hepatitis C, stable HIV positive, stable and un-detectable viral load  Discharged Condition: good  Hospital Course: the patient was admitted to day surgery on 07/26/2017 for wide excision of a posterior vulvar lesion with hyperkeratosis previously biopsied as VIN 3 there was extensive area of thickened hyperkeratotic tissue.  Excision of the entire specimen in 11 days was attempted. The left upper side became fragmented and required additional trimming. The possibility that some degree of dysplasia may still exist on the patient's right surgical margin, at 8 to 9:00. The perineal body was reconstructed with some reduction of the introitus size as we attempted to close the surgical defect site  Pathology report showed VIN 3 with no invasion. There was some involvement of the margin but I suspect this was on the right side at 8 to 9:00 where the specimen handed be taken off and patient will need follow-up regardless of the possibility of clear or involved margins that she will be prone to recurrence of this vulvar pathology  Consults: None  Significant Diagnostic Studies: labs:  CBC Latest Ref Rng & Units 07/21/2017 08/18/2016 06/25/2015  WBC 4.0 - 10.5 K/uL 5.7 9.2 8.7  Hemoglobin 12.0 - 15.0 g/dL 13.6 14.9 13.3  Hematocrit 36.0 - 46.0 % 39.4 43.6 37.7  Platelets 150 - 400 K/uL 214 294 222     Treatments: surgery: simple partial vulvectomy  Discharge Exam: Blood pressure (!) 130/93, pulse 82, temperature 97.8 F (36.6 C), temperature source Oral, resp. rate 16, height 5\' 5"  (1.651 m), weight 257 lb (116.6 kg), last menstrual period 02/01/2012, SpO2 96 %. Physical exam notable forgood tissue  reapproximation as we repaired the partial vulvectomy by rebuilding the support of the perineum which reduced the exposed vulvar tissues and should result in better perineal support  Disposition: 01-Home or Self Care  Discharge Instructions    Call MD for:  persistant nausea and vomiting   Complete by:  As directed    Call MD for:  severe uncontrolled pain   Complete by:  As directed    Call MD for:  temperature >100.4   Complete by:  As directed    Diet - low sodium heart healthy   Complete by:  As directed    Increase activity slowly   Complete by:  As directed    Sexual Activity Restrictions   Complete by:  As directed    No sexual activity until fully recovered, estimated 6 weeks     Allergies as of 07/26/2017      Reactions   Latex Hives   Peach [prunus Persica] Hives   Penicillins Hives   Has patient had a PCN reaction causing immediate rash, facial/tongue/throat swelling, SOB or lightheadedness with hypotension: Yes Has patient had a PCN reaction causing severe rash involving mucus membranes or skin necrosis: No Has patient had a PCN reaction that required hospitalization: No Has patient had a PCN reaction occurring within the last 10 years: No If all of the above answers are "NO", then may proceed with Cephalosporin use.      Medication List    TAKE these medications   abacavir-dolutegravir-lamiVUDine 510-25-852 MG tablet Commonly known as:  TRIUMEQ Take 1 tablet by mouth daily.   atorvastatin 20 MG tablet Commonly known  as:  LIPITOR Take 1 tablet (20 mg total) by mouth daily.   clonazePAM 1 MG tablet Commonly known as:  KLONOPIN Take 1 mg by mouth 4 (four) times daily.   fluticasone 50 MCG/ACT nasal spray Commonly known as:  FLONASE Place 1 spray into both nostrils 2 (two) times daily as needed for allergies or rhinitis.   Fluticasone-Salmeterol 100-50 MCG/DOSE Aepb Commonly known as:  ADVAIR Inhale 1 puff into the lungs 2 (two) times daily.   naproxen  500 MG tablet Commonly known as:  NAPROSYN Take 1 tablet (500 mg total) by mouth 2 (two) times daily with a meal.   naproxen sodium 220 MG tablet Commonly known as:  ALEVE Take 440 mg by mouth 2 (two) times daily as needed (pain).   oxyCODONE-acetaminophen 5-325 MG tablet Commonly known as:  ROXICET Take 1 tablet every 6 (six) hours as needed by mouth.   PARoxetine 40 MG tablet Commonly known as:  PAXIL Take 1 tablet by mouth daily.   QUEtiapine 100 MG tablet Commonly known as:  SEROQUEL Take 100 mg by mouth 3 (three) times daily.   QUEtiapine 300 MG tablet Commonly known as:  SEROQUEL Take 300 mg by mouth at bedtime.   traZODone 100 MG tablet Commonly known as:  DESYREL Take 100 mg by mouth at bedtime.   VYVANSE 50 MG capsule Generic drug:  lisdexamfetamine Take 50 mg by mouth every morning.      Follow-up Information    Jonnie Kind, MD In 2 weeks.   Specialties:  Obstetrics and Gynecology, Radiology Why:  As needed Contact information: [place] [address] [phone]           Signed: Jonnie Kind 08/02/2017, 1:59 PM

## 2017-08-03 ENCOUNTER — Ambulatory Visit (INDEPENDENT_AMBULATORY_CARE_PROVIDER_SITE_OTHER): Payer: Medicaid Other | Admitting: Obstetrics and Gynecology

## 2017-08-03 VITALS — BP 124/60 | HR 70 | Ht 65.5 in | Wt 255.0 lb

## 2017-08-03 DIAGNOSIS — D071 Carcinoma in situ of vulva: Secondary | ICD-10-CM

## 2017-08-03 DIAGNOSIS — Z08 Encounter for follow-up examination after completed treatment for malignant neoplasm: Secondary | ICD-10-CM

## 2017-08-03 DIAGNOSIS — Z9889 Other specified postprocedural states: Secondary | ICD-10-CM

## 2017-08-03 MED ORDER — OXYCODONE-ACETAMINOPHEN 5-325 MG PO TABS: 1.0000 | ORAL_TABLET | Freq: Four times a day (QID) | ORAL | 0 refills | Status: DC | PRN

## 2017-08-03 MED ORDER — SILVER SULFADIAZINE 1 % EX CREA
1.0000 | TOPICAL_CREAM | Freq: Every day | CUTANEOUS | 1 refills | Status: DC
Start: 2017-08-03 — End: 2017-08-17

## 2017-08-03 NOTE — Progress Notes (Signed)
° °  Subjective:  Nicole Silva is a 46 y.o. female now 1 weeks status post wide excision of vulvar lesion (partial vulvectomy).  Review of Systems Negative except pain and tenderness at excision site. Pt has been taking Cipro. She states her first decent bowel movement was 3 days ago, and had some bleeding. Pt is taking stool softeners.   Diet:   normal   Bowel movements : normal.  Pain is controlled with current analgesics. Medications being used: oxycodone.  Objective:  LMP 02/01/2012  General:Well developed, well nourished.  No acute distress. Abdomen: Bowel sounds normal, soft, non-tender. Pelvic Exam:    External Genitalia:  separation of efforts to pull perineum together to reduce introitus size, stitches are tearing through the tissue, pt noted tearing with first BM. Remainder of stitches removed. Will allow to heal by secondary intention. And cover with Silvadene  Incision(s):   Healing well, no drainage, no erythema, no hernia, no swelling, no dehiscence,     Assessment:  Post-Op 1 weeks s/p partial vulvectomy    Doing well postoperatively. Moderate amount of pain and tenderness.   separation of efforts to pull perineum together to reduce introitus size, stitches are tearing through the tissue. Remaining stitches removed.   Plan:  1.Wound care discussed.Remainder of stitches removed. Will allow to heal by secondary intention. And cover with Silvadene  Coat incision site with Silvadene 2. current medications: Cipro, oxycodone, Prescribe Silvadene, Refill pain medication 3. Activity restrictions: limit her activity while site heals 4. return to work: not applicable. 5. Follow up in 4 weeks.    By signing my name below, I, Izna Ahmed, attest that this documentation has been prepared under the direction and in the presence of Jonnie Kind, MD. Electronically Signed: Jabier Gauss, Medical Scribe. 08/03/17. 11:04 AM.  I personally performed the services described in  this documentation, which was SCRIBED in my presence. The recorded information has been reviewed and considered accurate. It has been edited as necessary during review. Jonnie Kind, MD

## 2017-08-03 NOTE — Patient Instructions (Signed)
sSilver Sulfadiazine skin cream What is this medicine? SILVER SULFADIAZINE (SIL ver sul fa DYE a zeen) is a sulfonamide antibiotic. It is used on the skin for second or third degree burns. It helps to prevent or treat serious infection. This medicine may be used for other purposes; ask your health care provider or pharmacist if you have questions. COMMON BRAND NAME(S): Silvadene, SSD, SSD AF, Thermazene What should I tell my health care provider before I take this medicine? They need to know if you have any of these conditions: -anemia or other blood disorders -glucose-6-phosphate dehydrogenase (G6PD) deficiency -kidney disease -liver disease -porphyria -an unusual or allergic reaction to silver sulfadiazine, sulfa drugs, other medicines, foods, dyes, or preservatives -pregnant or trying to get pregnant -breast-feeding How should I use this medicine? This medicine is for external use only. Follow the directions on the prescription label. Clean the affected area and remove burned or dead skin. Wear a sterile glove to apply the cream. Apply the cream to cover the whole area evenly. Treated areas can be left uncovered, but a gauze dressing may be used. Do not get this medicine in your eyes. If you do, rinse out with plenty of cool tap water. Finish the full course of medicine prescribed by your doctor or health care professional even if you think your condition is better. Do not stop using except on your doctor's advice. Talk to your pediatrician regarding the use of this medicine in children. Special care may be needed. Overdosage: If you think you have taken too much of this medicine contact a poison control center or emergency room at once. NOTE: This medicine is only for you. Do not share this medicine with others. What if I miss a dose? If you miss a dose, use it as soon as you can. If it is almost time for your next dose, use only that dose. Do not use double or extra doses. What may  interact with this medicine? -collagenase, papain, or sutilains This list may not describe all possible interactions. Give your health care provider a list of all the medicines, herbs, non-prescription drugs, or dietary supplements you use. Also tell them if you smoke, drink alcohol, or use illegal drugs. Some items may interact with your medicine. What should I watch for while using this medicine? Tell your doctor or health care professional if your skin condition does not begin to get better within 3 to 5 days. This medicine can make you more sensitive to the sun. Keep out of the sun. If you cannot avoid being in the sun, wear protective clothing and use sunscreen. Do not use sun lamps or tanning beds/booths. What side effects may I notice from receiving this medicine? Side effects that you should report to your doctor or health care professional as soon as possible: -fever, sore throat, chills -increased sensitivity to the sun or ultraviolet light -lower back pain -pain or difficulty passing urine -rash that appears or worsens following treatment, continued redness, swelling, burning, itching, stinging, or pain at the area of use -redness, blistering, peeling or loosening of the skin -unusual bleeding or bruising Side effects that usually do not require medical attention (report to your doctor or health care professional if they continue or are bothersome): -brownish gray discoloration of skin, nails or clothing -itching This list may not describe all possible side effects. Call your doctor for medical advice about side effects. You may report side effects to FDA at 1-800-FDA-1088. Where should I keep my medicine? Keep  out of the reach of children. Store at room temperature between 15 and 30 degrees C (59 and 86 degrees F). Throw away any unused medicine after the expiration date. NOTE: This sheet is a summary. It may not cover all possible information. If you have questions about this  medicine, talk to your doctor, pharmacist, or health care provider.  2018 Elsevier/Gold Standard (2008-05-01 15:26:44)

## 2017-08-08 MED FILL — TRIUMEQ 600-50-300 MG TABS: 600-50-300 | 30 days supply | Qty: 30 | Fill #9

## 2017-08-17 ENCOUNTER — Encounter: Payer: Self-pay | Admitting: Obstetrics and Gynecology

## 2017-08-17 ENCOUNTER — Ambulatory Visit (INDEPENDENT_AMBULATORY_CARE_PROVIDER_SITE_OTHER): Payer: Medicaid Other | Admitting: Obstetrics and Gynecology

## 2017-08-17 VITALS — BP 130/80 | HR 89 | Ht 65.5 in | Wt 254.0 lb

## 2017-08-17 DIAGNOSIS — Z4889 Encounter for other specified surgical aftercare: Secondary | ICD-10-CM

## 2017-08-17 DIAGNOSIS — D071 Carcinoma in situ of vulva: Secondary | ICD-10-CM

## 2017-08-17 MED ORDER — SILVER SULFADIAZINE 1 % EX CREA: 1.0000 "application " | TOPICAL_CREAM | Freq: Every day | CUTANEOUS | 1 refills | Status: DC

## 2017-08-17 MED ORDER — HYDROCODONE-ACETAMINOPHEN 5-325 MG PO TABS: 1.0000 | ORAL_TABLET | Freq: Four times a day (QID) | ORAL | 0 refills | Status: DC | PRN

## 2017-08-17 NOTE — Progress Notes (Signed)
Patient ID: Nicole Silva, female   DOB: 03/03/1971, 46 y.o.   MRN: [id number]    Subjective:  Nicole Silva is a 46 y.o. female now 3 weeks status post wide excision of vulvar lesion (partial vulvectomy).  That is healing in by secondary intention she has noticed a stitch that has been hanging out. She tried to lightly pull it out, but it began bleeding. She is still experiencing pain. She has been using silvadene as prescribed. She also experiencing dysuria.   Review of Systems Negative except pain and tenderness to site. She has also been experiencing mild bleeding.    Diet:   normal   Bowel movements : normal.  Pain is controlled with current analgesics. Medications being used: oxycodone.  Objective:  BP 130/80 (BP Location: Right Arm, Patient Position: Sitting, Cuff Size: Large)   Pulse 89   Ht 5' 5.5" (1.664 m)   Wt 254 lb (115.2 kg)   LMP 02/01/2012   BMI 41.62 kg/m  General:Well developed, well nourished.  No acute distress. Abdomen: Bowel sounds normal, soft, non-tender. Pelvic Exam: Large area of the posterior fourchette healing by secondary intention smooth margins and developing a firm base Remainder tissue bed: smooth and healing nicely. anticipate 4 more weeks      Incision(s):   Healing well, no drainage, no erythema, no hernia, no swelling, no dehiscence,     Assessment:  Post-Op 3 weeks s/p wide excision of vulvar lesion (partial vulvectomy)   Healing well postoperatively.   Plan:  1.Wound care discussed continue Silvadene prescription renewed 2. Rx oxycodone 5mg  3. Activity restrictions: no bending, stooping, or squatting and no lifting more than 15 pounds 4. return to work: not applicable. 5. Follow up in 4 weeks.  By signing my name below, I, Margit Banda, attest that this documentation has been prepared under the direction and in the presence of Jonnie Kind, MD. Electronically Signed: Margit Banda, Medical Scribe. 08/17/17. 11:34  AM.  I personally performed the services described in this documentation, which was SCRIBED in my presence. The recorded information has been reviewed and considered accurate. It has been edited as necessary during review. Jonnie Kind, MD

## 2017-09-14 ENCOUNTER — Encounter: Payer: Self-pay | Admitting: Obstetrics and Gynecology

## 2017-09-14 ENCOUNTER — Ambulatory Visit (INDEPENDENT_AMBULATORY_CARE_PROVIDER_SITE_OTHER): Payer: Medicaid Other | Admitting: Obstetrics and Gynecology

## 2017-09-14 VITALS — BP 122/80 | HR 82 | Ht 65.5 in | Wt 266.0 lb

## 2017-09-14 DIAGNOSIS — Z9889 Other specified postprocedural states: Secondary | ICD-10-CM

## 2017-09-14 DIAGNOSIS — D071 Carcinoma in situ of vulva: Secondary | ICD-10-CM

## 2017-09-14 DIAGNOSIS — Z09 Encounter for follow-up examination after completed treatment for conditions other than malignant neoplasm: Secondary | ICD-10-CM

## 2017-09-14 NOTE — Progress Notes (Signed)
Patient ID: [name], female   DOB: June 04, 1971, 47 y.o.   MRN: [id number]    Subjective:  Nicole Silva is a 47 y.o. female now 7 weeks status post wide excision of vulvar lesion (partial vulvectomy). She reports that the area has been improving. She ha been using the silvadene as prescribed and states she no longer experiences pain with urination. It is still tender to palpation.   Review of Systems Negative   Diet:   normal   Bowel movements : normal.  The patient is not having any pain.  Objective:  BP 122/80 (BP Location: Left Arm, Patient Position: Sitting, Cuff Size: Large)   Pulse 82   Ht 5' 5.5" (1.664 m)   Wt 266 lb (120.7 kg)   LMP 02/01/2012   BMI 43.59 kg/m  General:Well developed, well nourished.  No acute distress. Abdomen: Bowel sounds normal, soft, non-tender. Pelvic Exam:    External Genitalia:  Normal.    Vagina: small 75mm x 40mm area posterior fourchette still healing by secondary intention    Cervix: Normal     Uterus: Normal    Adnexa/Bimanual: Normal  Incision(s):   Healing well, no drainage, no erythema, no hernia, no swelling, no dehiscence,     Assessment:  Post-Op 7 weeks s/p wide excision of vulvar lesion (partial vulvectomy)    Doing well postoperatively.   Plan:  1.Wound care discussed   2. .current medications. 3. Activity restrictions: none 4. return to work: not applicable. 5. Follow up in 3 months.  By signing my name below, I, Margit Banda, attest that this documentation has been prepared under the direction and in the presence of Jonnie Kind, MD. Electronically Signed: Margit Banda, Medical Scribe. 09/14/17. 3:09 PM.  I personally performed the services described in this documentation, which was SCRIBED in my presence. The recorded information has been reviewed and considered accurate. It has been edited as necessary during review. Jonnie Kind, MD

## 2017-09-15 MED FILL — TRIUMEQ 600-50-300 MG TABS: 600-50-300 | 30 days supply | Qty: 30 | Fill #10

## 2017-09-16 ENCOUNTER — Other Ambulatory Visit: Payer: Self-pay | Admitting: Obstetrics and Gynecology

## 2017-09-16 NOTE — Telephone Encounter (Signed)
refil for silvadene rx'd written

## 2017-09-26 ENCOUNTER — Ambulatory Visit (INDEPENDENT_AMBULATORY_CARE_PROVIDER_SITE_OTHER): Payer: Medicaid Other | Admitting: Infectious Diseases

## 2017-09-26 ENCOUNTER — Encounter: Payer: Self-pay | Admitting: Infectious Diseases

## 2017-09-26 VITALS — Temp 97.5°F | Wt 266.0 lb

## 2017-09-26 DIAGNOSIS — B20 Human immunodeficiency virus [HIV] disease: Secondary | ICD-10-CM | POA: Diagnosis not present

## 2017-09-26 DIAGNOSIS — Z79899 Other long term (current) drug therapy: Secondary | ICD-10-CM

## 2017-09-26 DIAGNOSIS — Z113 Encounter for screening for infections with a predominantly sexual mode of transmission: Secondary | ICD-10-CM | POA: Diagnosis not present

## 2017-09-26 DIAGNOSIS — F1910 Other psychoactive substance abuse, uncomplicated: Secondary | ICD-10-CM | POA: Diagnosis not present

## 2017-09-26 DIAGNOSIS — D071 Carcinoma in situ of vulva: Secondary | ICD-10-CM | POA: Diagnosis not present

## 2017-09-26 DIAGNOSIS — R21 Rash and other nonspecific skin eruption: Secondary | ICD-10-CM | POA: Diagnosis not present

## 2017-09-26 MED ORDER — VALACYCLOVIR HCL 1 G PO TABS: 1000.0000 mg | ORAL_TABLET | Freq: Three times a day (TID) | ORAL | 1 refills | Status: DC

## 2017-09-26 MED ORDER — HYDROCODONE-ACETAMINOPHEN 5-325 MG PO TABS: 1.0000 | ORAL_TABLET | Freq: Four times a day (QID) | ORAL | 0 refills | Status: DC | PRN

## 2017-09-26 NOTE — Assessment & Plan Note (Signed)
Suspect this is shingles.  Will give her valtrex Get mammogram.

## 2017-09-26 NOTE — Assessment & Plan Note (Signed)
Greatly appreciate GYN f/u.

## 2017-09-26 NOTE — Progress Notes (Signed)
   Subjective:    Patient ID: Nicole Silva, female    DOB: [birth year], 47 y.o.   MRN: [id number]  HPI 47 yo F with HIV+, treated Hep C (VL - 06-2015), cough variant asthma, obesity. Still smoking, trying to cut back, down to 1/2 ppd.  Has been drug free for 5 yrs.  On descovy/tivicay--> triumeq (changed 10-2016).  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    She has recently noted a lump on her R breast.  Yesterday had pop/crack when she had a BM. She has been constipated due to pain rx. Has been applying silvadene.  Has GYN f/u on 09-30-17.  Has been taking alleve, tramadol, norco for pain relief.   HIV 1 RNA Quant (copies/mL)  Date Value  05/12/2017 <20 DETECTED (A)  08/18/2016 <20  06/25/2015 <20   CD4 T Cell Abs (/uL)  Date Value  05/12/2017 244 (L)  08/18/2016 330 (L)  06/25/2015 280 (L)    Review of Systems  Constitutional: Negative for appetite change, chills, fever and unexpected weight change.  Gastrointestinal: Positive for constipation. Negative for diarrhea.  Genitourinary: Positive for difficulty urinating.  Skin: Positive for rash.   Please see HPI. All other systems reviewed and negative.     Objective:   Physical Exam  Constitutional: She appears well-developed and well-nourished.  HENT:  Mouth/Throat: No oropharyngeal exudate.  Eyes: EOM are normal. Pupils are equal, round, and reactive to light.  Neck: Neck supple.  Cardiovascular: Normal rate, regular rhythm and normal heart sounds.  Pulmonary/Chest: Effort normal and breath sounds normal.    Abdominal: Soft. Bowel sounds are normal. There is no tenderness. There is no rebound.  Genitourinary:     Musculoskeletal: She exhibits tenderness.  Lymphadenopathy:    She has no cervical adenopathy.     Pt examined with NP and CMA     Assessment & Plan:

## 2017-09-26 NOTE — Addendum Note (Signed)
Addended by: Dolan Amen D on: 09/26/2017 12:25 PM   Modules accepted: Orders

## 2017-09-26 NOTE — Assessment & Plan Note (Signed)
She needs flu vax today She needs labs today.  Will cotninue her current rx (triumeq) Offered/refused condoms.  She will rtc in 4 months.

## 2017-09-26 NOTE — Assessment & Plan Note (Addendum)
States she has been drug free for 5 years.  Will give her 10 norco due to recent surgery.  I searched the [place] narc db. Her last rx was 7 days (28 tab) from Dr Glo Herring in early Dec.  I let he know that this is a 1 time rx.  She will f/u with GYN.

## 2017-09-27 LAB — T-HELPER CELL (CD4) - (RCID CLINIC ONLY)
CD4 % Helper T Cell: 9 % — ABNORMAL LOW (ref 33–55)
CD4 T Cell Abs: 240 /uL — ABNORMAL LOW (ref 400–2700)

## 2017-09-28 LAB — COMPREHENSIVE METABOLIC PANEL
AG Ratio: 1.1 (calc) (ref 1.0–2.5)
ALT: 13 U/L (ref 6–29)
AST: 11 U/L (ref 10–35)
Albumin: 4.2 g/dL (ref 3.6–5.1)
Alkaline phosphatase (APISO): 76 U/L (ref 33–115)
BUN: 12 mg/dL (ref 7–25)
CO2: 27 mmol/L (ref 20–32)
Calcium: 9.7 mg/dL (ref 8.6–10.2)
Chloride: 100 mmol/L (ref 98–110)
Creat: 0.96 mg/dL (ref 0.50–1.10)
Globulin: 4 g/dL (calc) — ABNORMAL HIGH (ref 1.9–3.7)
Glucose, Bld: 114 mg/dL — ABNORMAL HIGH (ref 65–99)
Potassium: 4.3 mmol/L (ref 3.5–5.3)
Sodium: 135 mmol/L (ref 135–146)
Total Bilirubin: 0.3 mg/dL (ref 0.2–1.2)
Total Protein: 8.2 g/dL — ABNORMAL HIGH (ref 6.1–8.1)

## 2017-09-28 LAB — CBC
HCT: 40.7 % (ref 35.0–45.0)
Hemoglobin: 14.3 g/dL (ref 11.7–15.5)
MCH: 34.1 pg — ABNORMAL HIGH (ref 27.0–33.0)
MCHC: 35.1 g/dL (ref 32.0–36.0)
MCV: 97.1 fL (ref 80.0–100.0)
MPV: 9.5 fL (ref 7.5–12.5)
Platelets: 296 10*3/uL (ref 140–400)
RBC: 4.19 10*6/uL (ref 3.80–5.10)
RDW: 13.9 % (ref 11.0–15.0)
WBC: 7.6 10*3/uL (ref 3.8–10.8)

## 2017-09-28 LAB — LIPID PANEL
Cholesterol: 215 mg/dL — ABNORMAL HIGH (ref <200)
HDL: 44 mg/dL — ABNORMAL LOW (ref >50)
LDL Cholesterol (Calc): 135 mg/dL (calc) — ABNORMAL HIGH
Non-HDL Cholesterol (Calc): 171 mg/dL (calc) — ABNORMAL HIGH (ref <130)
Total CHOL/HDL Ratio: 4.9 (calc) (ref <5.0)
Triglycerides: 214 mg/dL — ABNORMAL HIGH (ref <150)

## 2017-09-28 LAB — RPR: RPR Ser Ql: REACTIVE — AB

## 2017-09-28 LAB — RPR TITER: RPR Titer: 1:1 {titer} — ABNORMAL HIGH

## 2017-09-28 LAB — FLUORESCENT TREPONEMAL AB(FTA)-IGG-BLD: Fluorescent Treponemal ABS: REACTIVE — AB

## 2017-09-28 LAB — HIV-1 RNA QUANT-NO REFLEX-BLD
HIV 1 RNA Quant: 37 copies/mL — ABNORMAL HIGH
HIV-1 RNA Quant, Log: 1.57 Log copies/mL — ABNORMAL HIGH

## 2017-10-20 ENCOUNTER — Ambulatory Visit: Payer: Medicaid Other | Admitting: Family Medicine

## 2017-10-27 ENCOUNTER — Other Ambulatory Visit: Payer: Self-pay | Admitting: Infectious Diseases

## 2017-10-27 MED FILL — TRIUMEQ 600-50-300 MG TABS: 600-50-300 | 30 days supply | Qty: 30 | Fill #11

## 2017-11-09 ENCOUNTER — Ambulatory Visit: Payer: Medicaid Other | Admitting: Family Medicine

## 2017-11-17 ENCOUNTER — Encounter: Payer: Self-pay | Admitting: Family Medicine

## 2017-11-17 ENCOUNTER — Ambulatory Visit: Payer: Medicaid Other | Admitting: Family Medicine

## 2017-11-17 VITALS — BP 129/81 | HR 105 | Temp 98.0°F | Ht 65.5 in | Wt 263.0 lb

## 2017-11-17 DIAGNOSIS — J439 Emphysema, unspecified: Secondary | ICD-10-CM

## 2017-11-17 DIAGNOSIS — N3281 Overactive bladder: Secondary | ICD-10-CM | POA: Diagnosis not present

## 2017-11-17 DIAGNOSIS — F17200 Nicotine dependence, unspecified, uncomplicated: Secondary | ICD-10-CM | POA: Diagnosis not present

## 2017-11-17 MED ORDER — VARENICLINE TARTRATE 1 MG PO TABS: 1.0000 mg | ORAL_TABLET | Freq: Two times a day (BID) | ORAL | 1 refills | Status: DC

## 2017-11-17 MED ORDER — ALBUTEROL SULFATE HFA 108 (90 BASE) MCG/ACT IN AERS: 2.0000 | INHALATION_SPRAY | Freq: Four times a day (QID) | RESPIRATORY_TRACT | 0 refills | Status: AC | PRN

## 2017-11-17 MED ORDER — FLUTICASONE-SALMETEROL 100-50 MCG/DOSE IN AEPB: 1.0000 | INHALATION_SPRAY | Freq: Two times a day (BID) | RESPIRATORY_TRACT | 3 refills | Status: DC

## 2017-11-17 MED ORDER — MIRABEGRON ER 25 MG PO TB24: 25.0000 mg | ORAL_TABLET | Freq: Every day | ORAL | 0 refills | Status: DC

## 2017-11-17 MED ORDER — VARENICLINE TARTRATE 0.5 MG X 11 & 1 MG X 42 PO MISC: ORAL | 0 refills | Status: DC

## 2017-11-17 MED ORDER — ALBUTEROL SULFATE (2.5 MG/3ML) 0.083% IN NEBU: 2.5000 mg | INHALATION_SOLUTION | Freq: Four times a day (QID) | RESPIRATORY_TRACT | 1 refills | Status: DC | PRN

## 2017-11-17 NOTE — Progress Notes (Signed)
BP 129/81   Pulse (!) 105   Temp 98 F (36.7 C) (Oral)   Ht 5' 5.5" (1.664 m)   Wt 263 lb (119.3 kg)   LMP 02/01/2012   BMI 43.10 kg/m    Subjective:    Patient ID: Nicole Silva, female    DOB: 25-Dec-1970, 47 y.o.   MRN: [id number]  HPI: Nicole Silva is a 47 y.o. female presenting on 11/17/2017 for Interested in medication to help quit smoking and Prescription for nebulizer and medications   HPI Boils Pt was complaining of boil, but she used triple antibiotic cream and it has since resolved. It is no longer bothering her. It was in her groin area and due to her diaper rubbing her hair. Upon observation, there is approximately 1 cm round area of erythema.  Smoking cessation Pt is interested in smoking cessation medication. She has tried patches and they did not work. She stopped using them approximately 2 weeks ago. She has cut down from 1 pk/day to 9 cigarettes/day. She would like to go on a medication. Discussed Chantix and Wellbutrin. Pt has tried Wellbutrin previously several years ago and did okay on it, but was unsuccessful at quitting smoking. Would like to try Chantix. Discussed risks of psychiatric effects and suicidal ideations. She stated she is in a very good place right now and has permission from her psychiatrist to go on this medication and would really like to try it. Agreed to call the office/come in if there are any side effects, mood swings, or suicidal ideations.  COPD Pt is also here for refills on her COPD medications. She has been using someone else's nebulizer machine and needs her own. Has been using nebulizer 4x/day and feels her breathing is doing much better since she has been doing these treatments and getting more exercise. Also needs a refill for Albuterol and Advair. Has been using her twice Advair daily as prescribed.   OAB Also would like to restart her "fluid pill" which was likely her Myrbetriq. Stated that she has an overactive bladder and  feels the need to go to the bathroom several times a day. Went off the fluid pill because she thought it was getting better, but now would like to be on it because she feels she is "holding it" and feels her bladder getting too full.  Relevant past medical, surgical, family and social history reviewed and updated as indicated. Interim medical history since our last visit reviewed. Allergies and medications reviewed and updated.  Review of Systems  Constitutional: Negative for appetite change, fatigue and fever.  HENT: Negative for congestion, ear pain, rhinorrhea and sore throat.   Respiratory: Positive for cough. Negative for chest tightness, shortness of breath and wheezing.   Cardiovascular: Negative for chest pain and leg swelling.  Genitourinary: Positive for frequency (attributes to OAB). Negative for decreased urine volume, difficulty urinating and urgency.  Skin: Positive for wound (groin has a boil that is healing). Negative for pallor and rash.  Psychiatric/Behavioral: Negative for decreased concentration, dysphoric mood and suicidal ideas. The patient is not nervous/anxious.     Per HPI unless specifically indicated above   Allergies as of 11/17/2017      Reactions   Latex Hives   Peach [prunus Persica] Hives   Penicillins Hives   Has patient had a PCN reaction causing immediate rash, facial/tongue/throat swelling, SOB or lightheadedness with hypotension: Yes Has patient had a PCN reaction causing severe rash involving mucus membranes or skin  necrosis: No Has patient had a PCN reaction that required hospitalization: No Has patient had a PCN reaction occurring within the last 10 years: No If all of the above answers are "NO", then may proceed with Cephalosporin use.      Medication List        Accurate as of 11/17/17  1:51 PM. Always use your most recent med list.          albuterol 108 (90 Base) MCG/ACT inhaler Commonly known as:  PROVENTIL HFA;VENTOLIN HFA Inhale 2  puffs into the lungs every 6 (six) hours as needed for wheezing or shortness of breath.   albuterol (2.5 MG/3ML) 0.083% nebulizer solution Commonly known as:  PROVENTIL Take 3 mLs (2.5 mg total) by nebulization every 6 (six) hours as needed for wheezing or shortness of breath.   clonazePAM 1 MG tablet Commonly known as:  KLONOPIN Take 2 mg by mouth 4 (four) times daily.   fluticasone 50 MCG/ACT nasal spray Commonly known as:  FLONASE Place 1 spray into both nostrils 2 (two) times daily as needed for allergies or rhinitis.   Fluticasone-Salmeterol 100-50 MCG/DOSE Aepb Commonly known as:  ADVAIR Inhale 1 puff into the lungs 2 (two) times daily.   HYDROcodone-acetaminophen 5-325 MG tablet Commonly known as:  NORCO Take 1 tablet by mouth every 6 (six) hours as needed for moderate pain.   mirabegron ER 25 MG Tb24 tablet Commonly known as:  MYRBETRIQ Take 1 tablet (25 mg total) by mouth daily.   naproxen 500 MG tablet Commonly known as:  NAPROSYN Take 1 tablet (500 mg total) by mouth 2 (two) times daily with a meal.   naproxen sodium 220 MG tablet Commonly known as:  ALEVE Take 440 mg by mouth 2 (two) times daily as needed (pain).   PARoxetine 40 MG tablet Commonly known as:  PAXIL Take 1 tablet by mouth daily.   QUEtiapine 100 MG tablet Commonly known as:  SEROQUEL Take 100 mg by mouth 3 (three) times daily.   QUEtiapine 300 MG tablet Commonly known as:  SEROQUEL Take 300 mg by mouth at bedtime.   TRIUMEQ 600-50-300 MG tablet Generic drug:  abacavir-dolutegravir-lamiVUDine TAKE 1 TABLET BY MOUTH ONCE DAILY   valACYclovir 1000 MG tablet Commonly known as:  VALTREX Take 1 tablet (1,000 mg total) by mouth 3 (three) times daily.   varenicline 0.5 MG X 11 & 1 MG X 42 tablet Commonly known as:  CHANTIX STARTING MONTH PAK Take one 0.5 mg tablet by mouth daily for 3 days, then one 0.5 mg tablet twice daily for 4 days, then one 1 mg tablet twice daily.   varenicline 1 MG  tablet Commonly known as:  CHANTIX CONTINUING MONTH PAK Take 1 tablet (1 mg total) by mouth 2 (two) times daily.   VYVANSE 50 MG capsule Generic drug:  lisdexamfetamine Take 50 mg by mouth every morning.            Durable Medical Equipment  (From admission, onward)        Start     Ordered   11/17/17 0000  DME Nebulizer machine    Question:  Patient needs a nebulizer to treat with the following condition  Answer:  COPD (chronic obstructive pulmonary disease) ([place])   11/17/17 1341         Objective:    BP 129/81   Pulse (!) 105   Temp 98 F (36.7 C) (Oral)   Ht 5' 5.5" (1.664 m)   Wt 263  lb (119.3 kg)   LMP 02/01/2012   BMI 43.10 kg/m   Wt Readings from Last 3 Encounters:  11/17/17 263 lb (119.3 kg)  09/26/17 266 lb (120.7 kg)  09/14/17 266 lb (120.7 kg)    Physical Exam  Constitutional: She is oriented to person, place, and time. She appears well-developed and well-nourished.  HENT:  Right Ear: Hearing, tympanic membrane, external ear and ear canal normal.  Left Ear: Hearing, tympanic membrane, external ear and ear canal normal.  Mouth/Throat: Oropharynx is clear and moist and mucous membranes are normal.  Neck: Neck supple. No thyromegaly present.  Cardiovascular: Normal rate, regular rhythm and normal heart sounds.  Pulmonary/Chest: Effort normal and breath sounds normal. No respiratory distress. She has no wheezes. She has no rales.  Lymphadenopathy:    She has no cervical adenopathy.  Neurological: She is alert and oriented to person, place, and time.  Skin:     Psychiatric: She has a normal mood and affect. Her behavior is normal. Thought content normal.  Vitals reviewed.       Assessment & Plan:   Problem List Items Addressed This Visit      Respiratory   COPD (chronic obstructive pulmonary disease) with emphysema (HCC)   Relevant Medications   Fluticasone-Salmeterol (ADVAIR) 100-50 MCG/DOSE AEPB   albuterol (PROVENTIL HFA;VENTOLIN HFA)  108 (90 Base) MCG/ACT inhaler   albuterol (PROVENTIL) (2.5 MG/3ML) 0.083% nebulizer solution   varenicline (CHANTIX STARTING MONTH PAK) 0.5 MG X 11 & 1 MG X 42 tablet   varenicline (CHANTIX CONTINUING MONTH PAK) 1 MG tablet   Other Relevant Orders   DME Nebulizer machine     Genitourinary   OAB (overactive bladder)   Relevant Medications   mirabegron ER (MYRBETRIQ) 25 MG TB24 tablet     Other   Smoker - Primary   Relevant Medications   varenicline (CHANTIX STARTING MONTH PAK) 0.5 MG X 11 & 1 MG X 42 tablet   varenicline (CHANTIX CONTINUING MONTH PAK) 1 MG tablet      Will order Chantix for smoking cessation. Gave strict instruction to return to office and call if there are any mood changes or suicidal ideations. Refill Mirabegron for OAB as sx have returned. Refill Albuterol nebulizer and inhalers and Advair inhaler for COPD.  Follow up plan: Return in about 4 weeks (around 12/15/2017), or if symptoms worsen or fail to improve, for Recheck smoking cessation.  Counseling provided for all of the vaccine components Orders Placed This Encounter  Procedures  . DME Nebulizer machine    Patient was seen and examined with Chaney Malling PA student, agree with assessment and plan above.  Discussed with patient that her breathing will likely improve if she is able to get off smoking which she does admit it had improved before when she had quit previously but it gotten worse when she restarted over the past couple years. Caryl Pina, MD [facility] 11/22/2017, 9:51 PM

## 2017-11-21 MED FILL — TRIUMEQ 600-50-300 MG TABS: 600-50-300 | 30 days supply | Qty: 30 | Fill #0

## 2017-12-09 ENCOUNTER — Other Ambulatory Visit: Payer: Self-pay | Admitting: Pharmacist

## 2017-12-14 ENCOUNTER — Ambulatory Visit: Payer: Medicaid Other | Admitting: Obstetrics and Gynecology

## 2017-12-15 ENCOUNTER — Ambulatory Visit: Payer: Medicaid Other | Admitting: Family Medicine

## 2017-12-21 ENCOUNTER — Encounter: Payer: Self-pay | Admitting: Family Medicine

## 2017-12-21 ENCOUNTER — Ambulatory Visit: Payer: Medicaid Other | Admitting: Family Medicine

## 2017-12-21 VITALS — BP 112/80 | HR 81 | Temp 98.1°F | Ht 65.5 in | Wt 254.0 lb

## 2017-12-21 DIAGNOSIS — N926 Irregular menstruation, unspecified: Secondary | ICD-10-CM | POA: Diagnosis not present

## 2017-12-21 DIAGNOSIS — N760 Acute vaginitis: Secondary | ICD-10-CM

## 2017-12-21 DIAGNOSIS — L03313 Cellulitis of chest wall: Secondary | ICD-10-CM | POA: Diagnosis not present

## 2017-12-21 DIAGNOSIS — B9689 Other specified bacterial agents as the cause of diseases classified elsewhere: Secondary | ICD-10-CM

## 2017-12-21 DIAGNOSIS — B20 Human immunodeficiency virus [HIV] disease: Secondary | ICD-10-CM

## 2017-12-21 DIAGNOSIS — R300 Dysuria: Secondary | ICD-10-CM

## 2017-12-21 DIAGNOSIS — Z716 Tobacco abuse counseling: Secondary | ICD-10-CM

## 2017-12-21 LAB — WET PREP FOR TRICH, YEAST, CLUE
Clue Cell Exam: POSITIVE — AB
Trichomonas Exam: NEGATIVE
Yeast Exam: NEGATIVE

## 2017-12-21 LAB — URINALYSIS, COMPLETE
Bilirubin, UA: NEGATIVE
Glucose, UA: NEGATIVE
Ketones, UA: NEGATIVE
Leukocytes, UA: NEGATIVE
Nitrite, UA: NEGATIVE
Protein, UA: NEGATIVE
Specific Gravity, UA: <1.005 — ABNORMAL LOW (ref 1.005–1.030)
Urobilinogen, Ur: 0.2 mg/dL (ref 0.2–1.0)
pH, UA: 5.5 (ref 5.0–7.5)

## 2017-12-21 LAB — MICROSCOPIC EXAMINATION
Bacteria, UA: NONE SEEN
Epithelial Cells (non renal): >10 /hpf — AB (ref 0–10)
Renal Epithel, UA: NONE SEEN /hpf

## 2017-12-21 LAB — PREGNANCY, URINE: Preg Test, Ur: NEGATIVE

## 2017-12-21 MED ORDER — BUPROPION HCL ER (SR) 150 MG PO TB12: 150.0000 mg | ORAL_TABLET | Freq: Two times a day (BID) | ORAL | 3 refills | Status: DC

## 2017-12-21 MED ORDER — METRONIDAZOLE 500 MG PO TABS: 500.0000 mg | ORAL_TABLET | Freq: Two times a day (BID) | ORAL | 0 refills | Status: DC

## 2017-12-21 MED ORDER — SULFAMETHOXAZOLE-TRIMETHOPRIM 800-160 MG PO TABS: 1.0000 | ORAL_TABLET | Freq: Two times a day (BID) | ORAL | 0 refills | Status: DC

## 2017-12-21 NOTE — Progress Notes (Signed)
BP 112/80   Pulse 81   Temp 98.1 F (36.7 C) (Oral)   Ht 5' 5.5" (1.664 m)   Wt 254 lb (115.2 kg)   LMP 02/01/2012   BMI 41.62 kg/m    Subjective:    Patient ID: Nicole Silva, female    DOB: May 27, 1971, 47 y.o.   MRN: [id number]  HPI: Nicole Silva is a 47 y.o. female presenting on 12/21/2017 for Discuss Wellbutrin (stopped Chantix because of thoughts/dreams); Lesion on left breast (area red and itchy); and Urinary Tract Infection (burning with urination, back pain; wants pregnancy test also, wants you to look at vaginal region)   HPI Dysuria and missed menstrual cycle and vaginal irritation Patient is coming in today with dysuria and missed menstrual cycle.  She says is been at least a week since she missed her menstrual cycle.  She denies any vaginal discharge but does have irritation especially when urinating.  She denies any flank pain or abdominal pain or fevers or chills.  Patient would like to try Wellbutrin for smoking cessation, had bad dreams on Chantix.  Painful skin lesion on left breast Patient has painful skin lesion on her left breast that has come up over the past 2 weeks but has really had a head over the past couple days and it did drain purulent drainage yesterday so it is feeling better but it is still red and warm and tender.  She has been trying to use triple antibiotic ointment on it.  Patient has HIV and is being managed by infectious disease.  Relevant past medical, surgical, family and social history reviewed and updated as indicated. Interim medical history since our last visit reviewed. Allergies and medications reviewed and updated.  Review of Systems  Constitutional: Negative for chills and fever.  Eyes: Negative for visual disturbance.  Respiratory: Negative for chest tightness and shortness of breath.   Cardiovascular: Negative for chest pain and leg swelling.  Gastrointestinal: Negative for abdominal pain.  Genitourinary: Positive for  dysuria, frequency, menstrual problem and vaginal pain. Negative for flank pain, hematuria, pelvic pain, vaginal bleeding and vaginal discharge.  Musculoskeletal: Negative for back pain and gait problem.  Skin: Positive for color change and rash.  Neurological: Negative for light-headedness and headaches.  Psychiatric/Behavioral: Negative for agitation and behavioral problems.  All other systems reviewed and are negative.   Per HPI unless specifically indicated above   Allergies as of 12/21/2017      Reactions   Latex Hives   Peach [prunus Persica] Hives   Penicillins Hives   Has patient had a PCN reaction causing immediate rash, facial/tongue/throat swelling, SOB or lightheadedness with hypotension: Yes Has patient had a PCN reaction causing severe rash involving mucus membranes or skin necrosis: No Has patient had a PCN reaction that required hospitalization: No Has patient had a PCN reaction occurring within the last 10 years: No If all of the above answers are "NO", then may proceed with Cephalosporin use.      Medication List        Accurate as of 12/21/17  2:03 PM. Always use your most recent med list.          albuterol 108 (90 Base) MCG/ACT inhaler Commonly known as:  PROVENTIL HFA;VENTOLIN HFA Inhale 2 puffs into the lungs every 6 (six) hours as needed for wheezing or shortness of breath.   albuterol (2.5 MG/3ML) 0.083% nebulizer solution Commonly known as:  PROVENTIL Take 3 mLs (2.5 mg total) by nebulization every  6 (six) hours as needed for wheezing or shortness of breath.   buPROPion 150 MG 12 hr tablet Commonly known as:  WELLBUTRIN SR Take 1 tablet (150 mg total) by mouth 2 (two) times daily.   clonazePAM 1 MG tablet Commonly known as:  KLONOPIN Take 2 mg by mouth 4 (four) times daily.   fluticasone 50 MCG/ACT nasal spray Commonly known as:  FLONASE Place 1 spray into both nostrils 2 (two) times daily as needed for allergies or rhinitis.     Fluticasone-Salmeterol 100-50 MCG/DOSE Aepb Commonly known as:  ADVAIR Inhale 1 puff into the lungs 2 (two) times daily.   mirabegron ER 25 MG Tb24 tablet Commonly known as:  MYRBETRIQ Take 1 tablet (25 mg total) by mouth daily.   PARoxetine 40 MG tablet Commonly known as:  PAXIL Take 1 tablet by mouth daily.   QUEtiapine 100 MG tablet Commonly known as:  SEROQUEL Take 100 mg by mouth 3 (three) times daily.   QUEtiapine 300 MG tablet Commonly known as:  SEROQUEL Take 300 mg by mouth at bedtime.   TRIUMEQ 600-50-300 MG tablet Generic drug:  abacavir-dolutegravir-lamiVUDine TAKE 1 TABLET BY MOUTH ONCE DAILY   valACYclovir 1000 MG tablet Commonly known as:  VALTREX Take 1 tablet (1,000 mg total) by mouth 3 (three) times daily.   VYVANSE 50 MG capsule Generic drug:  lisdexamfetamine Take 50 mg by mouth every morning.          Objective:    BP 112/80   Pulse 81   Temp 98.1 F (36.7 C) (Oral)   Ht 5' 5.5" (1.664 m)   Wt 254 lb (115.2 kg)   LMP 02/01/2012   BMI 41.62 kg/m   Wt Readings from Last 3 Encounters:  12/21/17 254 lb (115.2 kg)  11/17/17 263 lb (119.3 kg)  09/26/17 266 lb (120.7 kg)    Physical Exam  Constitutional: She is oriented to person, place, and time. She appears well-developed and well-nourished. No distress.  Eyes: Conjunctivae are normal.  Neck: Neck supple. No thyromegaly present.  Cardiovascular: Normal rate, regular rhythm, normal heart sounds and intact distal pulses.  No murmur heard. Pulmonary/Chest: Effort normal and breath sounds normal. No respiratory distress. She has no wheezes. She has no rales.  Genitourinary: Vagina normal and uterus normal. There is no rash or tenderness on the right labia. There is no rash or tenderness on the left labia. Cervix exhibits no motion tenderness, no discharge and no friability. Right adnexum displays no mass and no tenderness. Left adnexum displays no mass and no tenderness. No erythema in the  vagina. No vaginal discharge found.  Musculoskeletal: Normal range of motion. She exhibits no edema or tenderness.  Lymphadenopathy:    She has no cervical adenopathy.  Neurological: She is alert and oriented to person, place, and time. Coordination normal.  Skin: Skin is warm and dry. Rash noted. Rash is papular (Small pink papule on the left outer breast, small area of induration below, tender to palpation, ring of erythema around it.). She is not diaphoretic.  Psychiatric: She has a normal mood and affect. Her behavior is normal.  Nursing note and vitals reviewed.  Urinalysis: Urine pregnancy: Wet prep: Clue cells     Assessment & Plan:   Problem List Items Addressed This Visit      Other   Human immunodeficiency virus (HIV) disease (HCC)   Relevant Medications   metroNIDAZOLE (FLAGYL) 500 MG tablet   sulfamethoxazole-trimethoprim (BACTRIM DS,SEPTRA DS) 800-160 MG tablet  Other Visit Diagnoses    Dysuria    -  Primary   Relevant Medications   sulfamethoxazole-trimethoprim (BACTRIM DS,SEPTRA DS) 800-160 MG tablet   Other Relevant Orders   Urinalysis, Complete   Missed period       Relevant Orders   Pregnancy, urine   Bacterial vaginosis       Relevant Medications   metroNIDAZOLE (FLAGYL) 500 MG tablet   sulfamethoxazole-trimethoprim (BACTRIM DS,SEPTRA DS) 800-160 MG tablet   Other Relevant Orders   WET PREP FOR [place], YEAST, CLUE   Encounter for smoking cessation counseling       Relevant Medications   buPROPion (WELLBUTRIN SR) 150 MG 12 hr tablet   Cellulitis of chest wall       Relevant Medications   sulfamethoxazole-trimethoprim (BACTRIM DS,SEPTRA DS) 800-160 MG tablet       Follow up plan: Return if symptoms worsen or fail to improve.  Counseling provided for all of the vaccine components Orders Placed This Encounter  Procedures  . WET PREP FOR [place], YEAST, CLUE  . Urinalysis, Complete  . Pregnancy, urine    Caryl Pina, MD [facility] 12/21/2017, 2:03 PM

## 2017-12-23 ENCOUNTER — Other Ambulatory Visit: Payer: Self-pay | Admitting: Pharmacist

## 2017-12-23 MED FILL — TRIUMEQ 600-50-300 MG TABS: 600-50-300 | 30 days supply | Qty: 30 | Fill #1

## 2017-12-28 ENCOUNTER — Telehealth: Payer: Self-pay | Admitting: Adult Health

## 2017-12-28 ENCOUNTER — Other Ambulatory Visit: Payer: Self-pay

## 2017-12-28 ENCOUNTER — Encounter: Payer: Self-pay | Admitting: Obstetrics and Gynecology

## 2017-12-28 ENCOUNTER — Ambulatory Visit (INDEPENDENT_AMBULATORY_CARE_PROVIDER_SITE_OTHER): Payer: Medicaid Other | Admitting: Obstetrics and Gynecology

## 2017-12-28 DIAGNOSIS — D071 Carcinoma in situ of vulva: Secondary | ICD-10-CM

## 2017-12-28 MED ORDER — MEDROXYPROGESTERONE ACETATE 150 MG/ML IM SUSP: 150.0000 mg | INTRAMUSCULAR | 3 refills | Status: DC
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
## 2017-12-29 NOTE — Telephone Encounter (Signed)
Error

## 2018-01-04 ENCOUNTER — Ambulatory Visit: Payer: Medicaid Other

## 2018-01-06 ENCOUNTER — Ambulatory Visit: Payer: Medicaid Other

## 2018-01-16 ENCOUNTER — Ambulatory Visit (INDEPENDENT_AMBULATORY_CARE_PROVIDER_SITE_OTHER): Payer: Medicaid Other

## 2018-01-16 VITALS — HR 95 | Ht 65.2 in | Wt 256.4 lb

## 2018-01-16 DIAGNOSIS — Z3202 Encounter for pregnancy test, result negative: Secondary | ICD-10-CM | POA: Diagnosis not present

## 2018-01-16 DIAGNOSIS — Z3042 Encounter for surveillance of injectable contraceptive: Secondary | ICD-10-CM

## 2018-01-16 LAB — POCT URINE PREGNANCY: Preg Test, Ur: NEGATIVE

## 2018-01-16 MED ORDER — MEDROXYPROGESTERONE ACETATE 150 MG/ML IM SUSP
150.0000 mg | Freq: Once | INTRAMUSCULAR | Status: AC
  Administered 2018-01-16: 150 mg via INTRAMUSCULAR

## 2018-01-16 NOTE — Addendum Note (Signed)
Addended by: Diona Fanti A on: 01/16/2018 11:04 AM   Modules accepted: Level of Service

## 2018-01-16 NOTE — Progress Notes (Signed)
Pt here for depo injection. 150 mg IM given Lt deltoid. Tolerated well. Return 12 weeks for next injection.Pad CMA

## 2018-02-08 MED FILL — TRIUMEQ 600-50-300 MG TABS: 600-50-300 | 30 days supply | Qty: 30 | Fill #2

## 2018-02-16 ENCOUNTER — Ambulatory Visit: Payer: Medicaid Other | Admitting: Family Medicine

## 2018-02-16 ENCOUNTER — Encounter: Payer: Self-pay | Admitting: Family Medicine

## 2018-02-16 VITALS — BP 131/82 | HR 99 | Temp 98.1°F | Ht 65.2 in | Wt 256.0 lb

## 2018-02-16 DIAGNOSIS — M1711 Unilateral primary osteoarthritis, right knee: Secondary | ICD-10-CM | POA: Diagnosis not present

## 2018-02-16 DIAGNOSIS — M1611 Unilateral primary osteoarthritis, right hip: Secondary | ICD-10-CM

## 2018-02-16 DIAGNOSIS — B070 Plantar wart: Secondary | ICD-10-CM | POA: Diagnosis not present

## 2018-02-16 NOTE — Progress Notes (Signed)
BP 131/82   Pulse 99   Temp 98.1 F (36.7 C) (Oral)   Ht 5' 5.2" (1.656 m)   Wt 256 lb (116.1 kg)   BMI 42.34 kg/m    Subjective:    Patient ID: Nicole Silva, female    DOB: Dec 17, 1970, 47 y.o.   MRN: [id number]  HPI: Nicole Silva is a 47 y.o. female presenting on 02/16/2018 for Pain in right hip and right knee (x 2 months, no known injury)   HPI Right knee and right hip pain Patient is coming in complaining of right hip and right knee pain.  She says is been going on for a couple months.  She denies any specific injury and describes it more as an achiness that sometimes as sharp as well.  She denies any fevers or chills or redness or warmth.  She is not currently using anything over-the-counter but just wanted to have it looked at and see what we thought.  That hip pain is anteriorly and the knee pain is anteriorly and centrally as well.  She denies any pain radiating anywhere else besides the hip and the knee.  She has had this off and on before but it just been more over the past couple months.  She still describes it as mild to moderate.  She says it hurts more when she is using it for prolonged periods  Wart Patient has a wart on the plantar surface of her right great toe that she says is starting to cause her pain around it when she steps because if she feels like it is pinching.  It does not hurt when she is not stepping on it or weightbearing on it.  She denies any fevers or chills or redness or warmth or drainage around the site.  She was again treated today.  Relevant past medical, surgical, family and social history reviewed and updated as indicated. Interim medical history since our last visit reviewed. Allergies and medications reviewed and updated.  Review of Systems  Constitutional: Negative for chills and fever.  Eyes: Negative for redness and visual disturbance.  Respiratory: Negative for chest tightness and shortness of breath.   Cardiovascular: Negative  for chest pain and leg swelling.  Musculoskeletal: Positive for arthralgias and myalgias. Negative for back pain, gait problem and joint swelling.  Skin: Negative for rash.  Neurological: Negative for light-headedness and headaches.  Psychiatric/Behavioral: Negative for agitation and behavioral problems.  All other systems reviewed and are negative.   Per HPI unless specifically indicated above   Allergies as of 02/16/2018      Reactions   Latex Hives   Peach [prunus Persica] Hives   Penicillins Hives   Has patient had a PCN reaction causing immediate rash, facial/tongue/throat swelling, SOB or lightheadedness with hypotension: Yes Has patient had a PCN reaction causing severe rash involving mucus membranes or skin necrosis: No Has patient had a PCN reaction that required hospitalization: No Has patient had a PCN reaction occurring within the last 10 years: No If all of the above answers are "NO", then may proceed with Cephalosporin use.      Medication List        Accurate as of 02/16/18  1:29 PM. Always use your most recent med list.          albuterol 108 (90 Base) MCG/ACT inhaler Commonly known as:  PROVENTIL HFA;VENTOLIN HFA Inhale 2 puffs into the lungs every 6 (six) hours as needed for wheezing or shortness of  breath.   albuterol (2.5 MG/3ML) 0.083% nebulizer solution Commonly known as:  PROVENTIL Take 3 mLs (2.5 mg total) by nebulization every 6 (six) hours as needed for wheezing or shortness of breath.   buPROPion 150 MG 12 hr tablet Commonly known as:  WELLBUTRIN SR Take 1 tablet (150 mg total) by mouth 2 (two) times daily.   clonazePAM 1 MG tablet Commonly known as:  KLONOPIN Take 2 mg by mouth 4 (four) times daily.   Fluticasone-Salmeterol 100-50 MCG/DOSE Aepb Commonly known as:  ADVAIR Inhale 1 puff into the lungs 2 (two) times daily.   medroxyPROGESTERone 150 MG/ML injection Commonly known as:  DEPO-PROVERA Inject 1 mL (150 mg total) into the muscle  every 3 (three) months.   PARoxetine 40 MG tablet Commonly known as:  PAXIL Take 1 tablet by mouth daily.   QUEtiapine 100 MG tablet Commonly known as:  SEROQUEL Take 100 mg by mouth 3 (three) times daily.   QUEtiapine 300 MG tablet Commonly known as:  SEROQUEL Take 300 mg by mouth at bedtime.   TRIUMEQ 600-50-300 MG tablet Generic drug:  abacavir-dolutegravir-lamiVUDine TAKE 1 TABLET BY MOUTH ONCE DAILY   VYVANSE 50 MG capsule Generic drug:  lisdexamfetamine Take 75 mg by mouth every morning.          Objective:    BP 131/82   Pulse 99   Temp 98.1 F (36.7 C) (Oral)   Ht 5' 5.2" (1.656 m)   Wt 256 lb (116.1 kg)   BMI 42.34 kg/m   Wt Readings from Last 3 Encounters:  02/16/18 256 lb (116.1 kg)  01/16/18 256 lb 6.4 oz (116.3 kg)  12/28/17 253 lb (114.8 kg)    Physical Exam  Constitutional: She is oriented to person, place, and time. She appears well-developed and well-nourished. No distress.  Eyes: Pupils are equal, round, and reactive to light. Conjunctivae and EOM are normal.  Musculoskeletal: Normal range of motion. She exhibits tenderness (Anterior right hip pain, worse with range of motion of right hip in all directions.,  Anterior and knee pain, no ligament laxity noted). She exhibits no edema.  Neurological: She is alert and oriented to person, place, and time. Coordination normal.  Skin: Skin is warm and dry. No rash noted. She is not diaphoretic.  Small wart on the plantar surface of great toe  Psychiatric: She has a normal mood and affect. Her behavior is normal.  Nursing note and vitals reviewed.   Cryotherapy warts Patient is coming in today for cryotherapy of a wart on her right great toe, used a 15 blade to excise down to fracture tissue and did 3-10-second burst of liquid nitrogen, patient tolerated well, warned about blistering and keeping free from infection    Assessment & Plan:   Problem List Items Addressed This Visit    None    Visit  Diagnoses    Plantar wart    -  Primary   Arthritis of right knee       Arthritis of right hip          Right great toe plantar wart  Recommended Tylenol arthritis for hip and knee and weight loss.  Follow up plan: Return in about 3 weeks (around 03/09/2018), or if symptoms worsen or fail to improve, for Repeat wart treatment.  Counseling provided for all of the vaccine components No orders of the defined types were placed in this encounter.   Caryl Pina, MD [facility] 02/16/2018, 1:29 PM

## 2018-03-06 MED FILL — TRIUMEQ 600-50-300 MG TABS: 600-50-300 | 30 days supply | Qty: 30 | Fill #3

## 2018-03-10 ENCOUNTER — Ambulatory Visit (INDEPENDENT_AMBULATORY_CARE_PROVIDER_SITE_OTHER): Payer: Medicaid Other | Admitting: Family Medicine

## 2018-03-10 ENCOUNTER — Encounter: Payer: Self-pay | Admitting: Family Medicine

## 2018-03-10 VITALS — BP 139/82 | HR 96 | Temp 97.2°F | Ht 65.2 in | Wt 254.0 lb

## 2018-03-10 DIAGNOSIS — G5601 Carpal tunnel syndrome, right upper limb: Secondary | ICD-10-CM

## 2018-03-10 DIAGNOSIS — B070 Plantar wart: Secondary | ICD-10-CM | POA: Diagnosis not present

## 2018-03-10 NOTE — Progress Notes (Signed)
BP 139/82   Pulse 96   Temp (!) 97.2 F (36.2 C) (Oral)   Ht 5' 5.2" (1.656 m)   Wt 254 lb (115.2 kg)   BMI 42.01 kg/m    Subjective:    Patient ID: Nicole Silva, female    DOB: 1971-07-05, 47 y.o.   MRN: [id number]  HPI: Nicole Silva is a 47 y.o. female presenting on 03/10/2018 for follow up plantar wart and Tingling in first three digits right hand   HPI Plantar wart retreatment Patient is coming in today for a plantar wart retreatment on her right great toe on the plantar surface.  She said it did blister up after the last treatment and is back to where it was but she does feel like it slightly smaller.  She had tried some over-the-counter treatments but is coming back in for another treatment here today.  She denies any fevers or chills or redness or warmth or drainage  Right tingling in her thumb and first 2 fingers of that right hand that has been going on for the past couple months.  She says mainly she feels a lot when she wakes up in the morning but then it does improve through the day.  She denies any grip strength loss.  She denies any redness or warmth.  She describes it as a burning and tingling sensation like when your leg falls asleep that stays for some time and she has tried ibuprofen which has not helped much.  Relevant past medical, surgical, family and social history reviewed and updated as indicated. Interim medical history since our last visit reviewed. Allergies and medications reviewed and updated.  Review of Systems  Constitutional: Negative for chills and fever.  Eyes: Negative for visual disturbance.  Respiratory: Negative for chest tightness and shortness of breath.   Cardiovascular: Negative for chest pain and leg swelling.  Musculoskeletal: Negative for back pain and gait problem.  Skin: Negative for rash.  Neurological: Positive for numbness. Negative for weakness, light-headedness and headaches.  Psychiatric/Behavioral: Negative for  agitation and behavioral problems.  All other systems reviewed and are negative.   Per HPI unless specifically indicated above   Allergies as of 03/10/2018      Reactions   Latex Hives   Peach [prunus Persica] Hives   Penicillins Hives   Has patient had a PCN reaction causing immediate rash, facial/tongue/throat swelling, SOB or lightheadedness with hypotension: Yes Has patient had a PCN reaction causing severe rash involving mucus membranes or skin necrosis: No Has patient had a PCN reaction that required hospitalization: No Has patient had a PCN reaction occurring within the last 10 years: No If all of the above answers are "NO", then may proceed with Cephalosporin use.      Medication List        Accurate as of 03/10/18 11:21 AM. Always use your most recent med list.          albuterol 108 (90 Base) MCG/ACT inhaler Commonly known as:  PROVENTIL HFA;VENTOLIN HFA Inhale 2 puffs into the lungs every 6 (six) hours as needed for wheezing or shortness of breath.   buPROPion 150 MG 12 hr tablet Commonly known as:  WELLBUTRIN SR Take 1 tablet (150 mg total) by mouth 2 (two) times daily.   Fluticasone-Salmeterol 100-50 MCG/DOSE Aepb Commonly known as:  ADVAIR Inhale 1 puff into the lungs 2 (two) times daily.   medroxyPROGESTERone 150 MG/ML injection Commonly known as:  DEPO-PROVERA Inject 1 mL (150  mg total) into the muscle every 3 (three) months.   PARoxetine 40 MG tablet Commonly known as:  PAXIL Take 1 tablet by mouth daily.   QUEtiapine 100 MG tablet Commonly known as:  SEROQUEL Take 100 mg by mouth 3 (three) times daily.   QUEtiapine 300 MG tablet Commonly known as:  SEROQUEL Take 300 mg by mouth at bedtime.   TRIUMEQ 600-50-300 MG tablet Generic drug:  abacavir-dolutegravir-lamiVUDine TAKE 1 TABLET BY MOUTH ONCE DAILY   VYVANSE 50 MG capsule Generic drug:  lisdexamfetamine Take 75 mg by mouth every morning.          Objective:    BP 139/82   Pulse  96   Temp (!) 97.2 F (36.2 C) (Oral)   Ht 5' 5.2" (1.656 m)   Wt 254 lb (115.2 kg)   BMI 42.01 kg/m   Wt Readings from Last 3 Encounters:  03/10/18 254 lb (115.2 kg)  02/16/18 256 lb (116.1 kg)  01/16/18 256 lb 6.4 oz (116.3 kg)    Physical Exam  Constitutional: She is oriented to person, place, and time. She appears well-developed and well-nourished. No distress.  Musculoskeletal:       Right wrist: She exhibits no tenderness (No tenderness but positive Tinel sign on right hand, consistent with carpal tunnel of right hand, grip strength normal).  Neurological: She is alert and oriented to person, place, and time. Coordination normal.  Skin: Skin is warm and dry. No rash noted. She is not diaphoretic.  Psychiatric: She has a normal mood and affect. Her behavior is normal.  Nursing note and vitals reviewed.   Wart cryotherapy: Used a 15 blade to shave away some of the dead tissue from the wart.  Used liquid nitrogen in 3-10-second bursts on wart.  Patient tolerated well, gave instructions on what to watch for blistering and keeping the wound site clean.    Assessment & Plan:   Problem List Items Addressed This Visit    None    Visit Diagnoses    Plantar wart    -  Primary   Right carpal tunnel syndrome       Recommended bracing at night and anti-inflammatories    Re-treated plantar wart, recommend to come back in 3 weeks  Follow up plan: Return in about 3 weeks (around 03/31/2018), or if symptoms worsen or fail to improve, for Wart retreatment.  Counseling provided for all of the vaccine components No orders of the defined types were placed in this encounter.   Caryl Pina, MD [facility] 03/10/2018, 11:21 AM

## 2018-03-22 ENCOUNTER — Other Ambulatory Visit: Payer: Medicaid Other

## 2018-03-24 ENCOUNTER — Other Ambulatory Visit: Payer: Self-pay | Admitting: Family Medicine

## 2018-03-24 DIAGNOSIS — J439 Emphysema, unspecified: Secondary | ICD-10-CM

## 2018-03-27 ENCOUNTER — Other Ambulatory Visit: Payer: Self-pay | Admitting: *Deleted

## 2018-03-27 ENCOUNTER — Other Ambulatory Visit: Payer: Medicaid Other

## 2018-03-27 DIAGNOSIS — B20 Human immunodeficiency virus [HIV] disease: Secondary | ICD-10-CM

## 2018-03-30 MED FILL — TRIUMEQ 600-50-300 MG TABS: 600-50-300 | 30 days supply | Qty: 30 | Fill #4

## 2018-03-31 ENCOUNTER — Ambulatory Visit: Payer: Medicaid Other | Admitting: Family Medicine

## 2018-04-04 ENCOUNTER — Encounter: Payer: Self-pay | Admitting: Family Medicine

## 2018-04-05 ENCOUNTER — Encounter: Payer: Medicaid Other | Admitting: Infectious Diseases

## 2018-04-10 ENCOUNTER — Ambulatory Visit (INDEPENDENT_AMBULATORY_CARE_PROVIDER_SITE_OTHER): Payer: Medicaid Other | Admitting: Infectious Diseases

## 2018-04-10 ENCOUNTER — Ambulatory Visit: Payer: Medicaid Other

## 2018-04-10 ENCOUNTER — Encounter: Payer: Self-pay | Admitting: Infectious Diseases

## 2018-04-10 ENCOUNTER — Other Ambulatory Visit: Payer: Self-pay

## 2018-04-10 VITALS — BP 122/83 | HR 91 | Temp 97.7°F | Ht 65.5 in | Wt 262.0 lb

## 2018-04-10 DIAGNOSIS — F17200 Nicotine dependence, unspecified, uncomplicated: Secondary | ICD-10-CM | POA: Diagnosis not present

## 2018-04-10 DIAGNOSIS — B20 Human immunodeficiency virus [HIV] disease: Secondary | ICD-10-CM

## 2018-04-10 DIAGNOSIS — D071 Carcinoma in situ of vulva: Secondary | ICD-10-CM

## 2018-04-10 DIAGNOSIS — Z23 Encounter for immunization: Secondary | ICD-10-CM

## 2018-04-10 DIAGNOSIS — Z79899 Other long term (current) drug therapy: Secondary | ICD-10-CM | POA: Diagnosis not present

## 2018-04-10 DIAGNOSIS — Z1231 Encounter for screening mammogram for malignant neoplasm of breast: Secondary | ICD-10-CM

## 2018-04-10 DIAGNOSIS — Z113 Encounter for screening for infections with a predominantly sexual mode of transmission: Secondary | ICD-10-CM

## 2018-04-10 DIAGNOSIS — Z1239 Encounter for other screening for malignant neoplasm of breast: Secondary | ICD-10-CM

## 2018-04-10 LAB — COMPLETE METABOLIC PANEL WITH GFR
AG Ratio: 1.2 (calc) (ref 1.0–2.5)
ALT: 17 U/L (ref 6–29)
AST: 13 U/L (ref 10–35)
Albumin: 4.1 g/dL (ref 3.6–5.1)
Alkaline phosphatase (APISO): 67 U/L (ref 33–115)
BUN: 11 mg/dL (ref 7–25)
CO2: 23 mmol/L (ref 20–32)
Calcium: 9.3 mg/dL (ref 8.6–10.2)
Chloride: 102 mmol/L (ref 98–110)
Creat: 0.74 mg/dL (ref 0.50–1.10)
GFR, Est African American: 110 mL/min/{1.73_m2} (ref >=60)
GFR, Est Non African American: 95 mL/min/{1.73_m2} (ref >=60)
Globulin: 3.4 g/dL (calc) (ref 1.9–3.7)
Glucose, Bld: 101 mg/dL — ABNORMAL HIGH (ref 65–99)
Potassium: 4.5 mmol/L (ref 3.5–5.3)
Sodium: 134 mmol/L — ABNORMAL LOW (ref 135–146)
Total Bilirubin: 0.4 mg/dL (ref 0.2–1.2)
Total Protein: 7.5 g/dL (ref 6.1–8.1)

## 2018-04-10 LAB — CBC WITH DIFFERENTIAL/PLATELET
Basophils Absolute: 21 cells/uL (ref 0–200)
Basophils Relative: 0.3 %
Eosinophils Absolute: 71 cells/uL (ref 15–500)
Eosinophils Relative: 1 %
HCT: 39.3 % (ref 35.0–45.0)
Hemoglobin: 13.6 g/dL (ref 11.7–15.5)
Lymphs Abs: 1874 cells/uL (ref 850–3900)
MCH: 35.1 pg — ABNORMAL HIGH (ref 27.0–33.0)
MCHC: 34.6 g/dL (ref 32.0–36.0)
MCV: 101.3 fL — ABNORMAL HIGH (ref 80.0–100.0)
MPV: 9.3 fL (ref 7.5–12.5)
Monocytes Relative: 6.3 %
Neutro Abs: 4686 cells/uL (ref 1500–7800)
Neutrophils Relative %: 66 %
Platelets: 257 10*3/uL (ref 140–400)
RBC: 3.88 10*6/uL (ref 3.80–5.10)
RDW: 14.3 % (ref 11.0–15.0)
Total Lymphocyte: 26.4 %
WBC mixed population: 447 cells/uL (ref 200–950)
WBC: 7.1 10*3/uL (ref 3.8–10.8)

## 2018-04-10 NOTE — Addendum Note (Signed)
Addended byDoretha Sou on: 04/10/2018 12:41 PM   Modules accepted: Orders

## 2018-04-10 NOTE — Assessment & Plan Note (Signed)
Encouraged to quit. 

## 2018-04-10 NOTE — Progress Notes (Signed)
   Subjective:    Patient ID: Nicole Silva, female    DOB: [birth year], 47 y.o.   MRN: [id number]  HPI 47yo F with HIV+, treated Hep C (VL - 06-2015), cough variant asthma, obesity. Still smoking, trying to cut back, down to 1/2 ppd.  Has been drug free for 5 yrs.  Ondescovy/tivicay--> triumeq (changed 10-2016).  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  SHe had GYN f/u 12-2017 and got depo injection.   She has recently noted a lump on her R breast. She has been having sweats which she attributes to menopause. Had to buy plastic sheets. Having mood swings.  She had breast lump at prev visit, did not get mammo. It has resolved.  No problems with Triumeq, denies missed.   HIV 1 RNA Quant (copies/mL)  Date Value  09/26/2017 37 (H)  05/12/2017 <20 DETECTED (A)  08/18/2016 <20   CD4 T Cell Abs (/uL)  Date Value  09/26/2017 240 (L)  05/12/2017 244 (L)  08/18/2016 330 (L)    Review of Systems  Constitutional: Negative for appetite change and unexpected weight change.  Gastrointestinal: Positive for diarrhea. Negative for constipation.  Genitourinary: Negative for difficulty urinating and menstrual problem.  Psychiatric/Behavioral: Positive for dysphoric mood.  Please see HPI. All other systems reviewed and negative. Walks qod. Limited by heat.     Objective:   Physical Exam  Constitutional: She appears well-developed and well-nourished.  HENT:  Mouth/Throat: No oropharyngeal exudate.  Eyes: Pupils are equal, round, and reactive to light. EOM are normal.  Neck: Normal range of motion. Neck supple.  Cardiovascular: Normal rate, regular rhythm and normal heart sounds.  Pulmonary/Chest: Effort normal and breath sounds normal.  Abdominal: Soft. Bowel sounds are normal. She exhibits no distension. There is no tenderness.  Musculoskeletal: She exhibits no edema.  Lymphadenopathy:    She has no cervical  adenopathy.  Skin: Skin is warm and dry.  Psychiatric: She has a normal mood and affect.       Assessment & Plan:

## 2018-04-10 NOTE — Assessment & Plan Note (Signed)
Appreciate GYN f/u Will ask if Dr Glo Herring can give her rec about hormones for menopause (can she have it with depo?)

## 2018-04-10 NOTE — Assessment & Plan Note (Signed)
Her partner is (-). Offered prep. She will ask. He is tested regularly.   They have condoms PCV today Will get mammo Wants to defer colon for her diarrhea.  To lab today.

## 2018-04-11 LAB — T-HELPER CELL (CD4) - (RCID CLINIC ONLY)
CD4 % Helper T Cell: 9 % — ABNORMAL LOW (ref 33–55)
CD4 T Cell Abs: 150 /uL — ABNORMAL LOW (ref 400–2700)

## 2018-04-11 NOTE — Addendum Note (Signed)
Addended by: Landis Gandy on: 04/11/2018 01:21 PM   Modules accepted: Orders

## 2018-04-12 LAB — HIV-1 RNA QUANT-NO REFLEX-BLD
HIV 1 RNA Quant: 28600 copies/mL — ABNORMAL HIGH
HIV-1 RNA Quant, Log: 4.46 Log copies/mL — ABNORMAL HIGH

## 2018-04-14 ENCOUNTER — Ambulatory Visit: Payer: Medicaid Other

## 2018-04-24 MED FILL — TRIUMEQ 600-50-300 MG TABS: 600-50-300 | 30 days supply | Qty: 30 | Fill #5

## 2018-05-17 ENCOUNTER — Other Ambulatory Visit: Payer: Self-pay | Admitting: Infectious Diseases

## 2018-05-24 MED FILL — TRIUMEQ 600-50-300 MG TABS: 600-50-300 | 30 days supply | Qty: 30 | Fill #0

## 2018-06-01 ENCOUNTER — Encounter: Payer: Self-pay | Admitting: Family Medicine

## 2018-06-01 ENCOUNTER — Ambulatory Visit (INDEPENDENT_AMBULATORY_CARE_PROVIDER_SITE_OTHER): Payer: Medicaid Other | Admitting: Family Medicine

## 2018-06-01 VITALS — BP 143/79 | HR 109 | Temp 97.8°F | Ht 65.5 in | Wt 262.8 lb

## 2018-06-01 DIAGNOSIS — J439 Emphysema, unspecified: Secondary | ICD-10-CM | POA: Diagnosis not present

## 2018-06-01 DIAGNOSIS — J441 Chronic obstructive pulmonary disease with (acute) exacerbation: Secondary | ICD-10-CM

## 2018-06-01 DIAGNOSIS — B070 Plantar wart: Secondary | ICD-10-CM

## 2018-06-01 MED ORDER — FLUTICASONE-SALMETEROL 100-50 MCG/DOSE IN AEPB
1.0000 | INHALATION_SPRAY | Freq: Two times a day (BID) | RESPIRATORY_TRACT | 3 refills | Status: AC
Start: 2018-06-01 — End: ?

## 2018-06-01 MED ORDER — PREDNISONE 20 MG PO TABS: ORAL_TABLET | ORAL | 0 refills | Status: DC

## 2018-06-01 MED ORDER — CEFDINIR 300 MG PO CAPS: 300.0000 mg | ORAL_CAPSULE | Freq: Two times a day (BID) | ORAL | 0 refills | Status: DC

## 2018-06-01 NOTE — Progress Notes (Signed)
BP (!) 143/79   Pulse (!) 109   Temp 97.8 F (36.6 C) (Oral)   Ht 5' 5.5" (1.664 m)   Wt 262 lb 12.8 oz (119.2 kg)   SpO2 97%   BMI 43.07 kg/m    Subjective:    Patient ID: Nicole Silva, female    DOB: [birth year], 47 y.o.   MRN: [id number]  HPI: Nicole Silva is a 47 y.o. female presenting on 06/01/2018 for Cough (x 2 week- otc mucinex); Nasal Congestion; chest congestion; and Plantar Warts (Right big toe- needs frozen again)   HPI Cough congestion Patient comes in complaining of cough and congestion and wheezing and chest congestion that has been going on for 2 weeks but is significantly worsened over the past week since she got back from a trip to [place] for family.  She has been using over-the-counter Mucinex which has not been helping significantly.  She denies shortness of breath but does have a lot of chest tightness and wheezing.  She has been using her inhalers including her Advair although she is getting to where she is almost out of her Advair.  She denies any fevers or chills.  Right great toe plantar wart Patient has a plantar wart on the right great toe and she is coming in for retreatment on it.  She did 2 treatments in a row about 3 months ago but then she had some family stuff so she has not come back in some time.  She is coming back to get another treatment for.  Relevant past medical, surgical, family and social history reviewed and updated as indicated. Interim medical history since our last visit reviewed. Allergies and medications reviewed and updated.  Review of Systems  Constitutional: Negative for chills and fever.  HENT: Positive for congestion, postnasal drip, rhinorrhea, sinus pressure, sneezing and sore throat. Negative for ear discharge and ear pain.   Eyes: Negative for pain, redness and visual disturbance.  Respiratory: Positive for cough and wheezing. Negative for chest tightness and shortness of breath.   Cardiovascular: Negative for  chest pain and leg swelling.  Genitourinary: Negative for difficulty urinating and dysuria.  Musculoskeletal: Negative for back pain and gait problem.  Skin: Negative for rash.  Neurological: Negative for light-headedness and headaches.  Psychiatric/Behavioral: Negative for agitation and behavioral problems.  All other systems reviewed and are negative.   Per HPI unless specifically indicated above   Allergies as of 06/01/2018      Reactions   Latex Hives   Peach [prunus Persica] Hives   Penicillins Hives   Has patient had a PCN reaction causing immediate rash, facial/tongue/throat swelling, SOB or lightheadedness with hypotension: Yes Has patient had a PCN reaction causing severe rash involving mucus membranes or skin necrosis: No Has patient had a PCN reaction that required hospitalization: No Has patient had a PCN reaction occurring within the last 10 years: No If all of the above answers are "NO", then may proceed with Cephalosporin use.      Medication List        Accurate as of 06/01/18  1:21 PM. Always use your most recent med list.          albuterol 108 (90 Base) MCG/ACT inhaler Commonly known as:  PROVENTIL HFA;VENTOLIN HFA Inhale 2 puffs into the lungs every 6 (six) hours as needed for wheezing or shortness of breath.   albuterol (2.5 MG/3ML) 0.083% nebulizer solution Commonly known as:  PROVENTIL INHALE CONTENTS OF 1 VIAL IN  NEBULIZER EVERY 6 HOURS AS NEEDED FOR WHEEZING OR SHORTNESS OF BREATH   cefdinir 300 MG capsule Commonly known as:  OMNICEF Take 1 capsule (300 mg total) by mouth 2 (two) times daily. 1 po BID   clonazePAM 1 MG tablet Commonly known as:  KLONOPIN Take 1 mg by mouth 4 (four) times daily.   Fluticasone-Salmeterol 100-50 MCG/DOSE Aepb Commonly known as:  ADVAIR Inhale 1 puff into the lungs 2 (two) times daily.   medroxyPROGESTERone 150 MG/ML injection Commonly known as:  DEPO-PROVERA Inject 1 mL (150 mg total) into the muscle every 3  (three) months.   PARoxetine 40 MG tablet Commonly known as:  PAXIL Take 1 tablet by mouth daily.   predniSONE 20 MG tablet Commonly known as:  DELTASONE 2 po at same time daily for 5 days   QUEtiapine 100 MG tablet Commonly known as:  SEROQUEL Take 100 mg by mouth 3 (three) times daily.   QUEtiapine 300 MG tablet Commonly known as:  SEROQUEL Take 400 mg by mouth at bedtime.   TRIUMEQ 600-50-300 MG tablet Generic drug:  abacavir-dolutegravir-lamiVUDine TAKE 1 TABLET BY MOUTH ONCE DAILY   VYVANSE 70 MG capsule Generic drug:  lisdexamfetamine Take 70 mg by mouth 2 (two) times daily.          Objective:    BP (!) 143/79   Pulse (!) 109   Temp 97.8 F (36.6 C) (Oral)   Ht 5' 5.5" (1.664 m)   Wt 262 lb 12.8 oz (119.2 kg)   SpO2 97%   BMI 43.07 kg/m   Wt Readings from Last 3 Encounters:  06/01/18 262 lb 12.8 oz (119.2 kg)  04/10/18 262 lb (118.8 kg)  03/10/18 254 lb (115.2 kg)    Physical Exam  Constitutional: She is oriented to person, place, and time. She appears well-developed and well-nourished. No distress.  HENT:  Right Ear: Tympanic membrane, external ear and ear canal normal.  Left Ear: Tympanic membrane, external ear and ear canal normal.  Nose: Mucosal edema and rhinorrhea present. No epistaxis. Right sinus exhibits no maxillary sinus tenderness and no frontal sinus tenderness. Left sinus exhibits no maxillary sinus tenderness and no frontal sinus tenderness.  Mouth/Throat: Uvula is midline and mucous membranes are normal. Posterior oropharyngeal edema and posterior oropharyngeal erythema present. No oropharyngeal exudate or tonsillar abscesses.  Eyes: Conjunctivae and EOM are normal.  Cardiovascular: Normal rate, regular rhythm, normal heart sounds and intact distal pulses.  No murmur heard. Pulmonary/Chest: Effort normal. No respiratory distress. She has no wheezes. She has rhonchi in the right upper field and the left upper field. She has no rales.    Musculoskeletal: Normal range of motion. She exhibits no edema or tenderness.  Neurological: She is alert and oriented to person, place, and time. Coordination normal.  Skin: Skin is warm and dry. Lesion (0.25 cm plantar wart on right great toe) noted. No rash noted. She is not diaphoretic.  Psychiatric: She has a normal mood and affect. Her behavior is normal.  Nursing note and vitals reviewed.  Cryotherapy wart: Verbal consent was obtained.  Used liquid nitrogen in 3-10-second bursts on wart of great toe plantar wart.  Patient tolerated well.  Return in 3 weeks for retreatment if needed.     Assessment & Plan:   Problem List Items Addressed This Visit      Respiratory   COPD (chronic obstructive pulmonary disease) with emphysema ([place])   Relevant Medications   predniSONE (DELTASONE) 20 MG tablet  Fluticasone-Salmeterol (ADVAIR) 100-50 MCG/DOSE AEPB   cefdinir (OMNICEF) 300 MG capsule    Other Visit Diagnoses    Plantar wart    -  Primary   Relevant Medications   cefdinir (OMNICEF) 300 MG capsule   COPD exacerbation (H[facility])       Relevant Medications   predniSONE (DELTASONE) 20 MG tablet   Fluticasone-Salmeterol (ADVAIR) 100-50 MCG/DOSE AEPB   cefdinir (OMNICEF) 300 MG capsule       Follow up plan: Return in about 3 weeks (around 06/22/2018), or if symptoms worsen or fail to improve, for Plantar toe wart.  Counseling provided for all of the vaccine components No orders of the defined types were placed in this encounter.   Caryl Pina, MD [facility] 06/01/2018, 1:21 PM

## 2018-06-20 MED FILL — TRIUMEQ 600-50-300 MG TABS: 600-50-300 | 30 days supply | Qty: 30 | Fill #1

## 2018-06-21 ENCOUNTER — Ambulatory Visit: Payer: Medicaid Other | Admitting: Family Medicine

## 2018-06-27 ENCOUNTER — Other Ambulatory Visit: Payer: Self-pay | Admitting: Infectious Diseases

## 2018-06-27 DIAGNOSIS — Z1231 Encounter for screening mammogram for malignant neoplasm of breast: Secondary | ICD-10-CM

## 2018-06-29 ENCOUNTER — Ambulatory Visit: Payer: Medicaid Other | Admitting: Family Medicine

## 2018-07-05 ENCOUNTER — Encounter: Payer: Self-pay | Admitting: Family Medicine

## 2018-07-14 MED FILL — TRIUMEQ 600-50-300 MG TABS: 600-50-300 | 30 days supply | Qty: 30 | Fill #2

## 2018-08-03 ENCOUNTER — Telehealth: Payer: Self-pay | Admitting: Family Medicine

## 2018-08-03 ENCOUNTER — Ambulatory Visit
Admission: RE | Admit: 2018-08-03 | Discharge: 2018-08-03 | Disposition: A | Discharge status: Home / Self Care | Payer: Medicaid Other | Source: Ambulatory Visit | Attending: Infectious Diseases | Admitting: Infectious Diseases

## 2018-08-03 DIAGNOSIS — Z1231 Encounter for screening mammogram for malignant neoplasm of breast: Secondary | ICD-10-CM

## 2018-08-11 MED FILL — TRIUMEQ 600-50-300 MG TABS: 600-50-300 | 30 days supply | Qty: 30 | Fill #3

## 2018-08-14 ENCOUNTER — Ambulatory Visit (INDEPENDENT_AMBULATORY_CARE_PROVIDER_SITE_OTHER): Payer: Medicaid Other | Admitting: Family Medicine

## 2018-08-14 ENCOUNTER — Encounter: Payer: Self-pay | Admitting: Family Medicine

## 2018-08-14 ENCOUNTER — Ambulatory Visit (INDEPENDENT_AMBULATORY_CARE_PROVIDER_SITE_OTHER): Payer: Medicaid Other

## 2018-08-14 VITALS — BP 127/78 | HR 96 | Temp 97.0°F | Ht 65.5 in | Wt 267.4 lb

## 2018-08-14 DIAGNOSIS — S93491A Sprain of other ligament of right ankle, initial encounter: Secondary | ICD-10-CM

## 2018-08-14 DIAGNOSIS — M25571 Pain in right ankle and joints of right foot: Secondary | ICD-10-CM

## 2018-08-14 DIAGNOSIS — B070 Plantar wart: Secondary | ICD-10-CM | POA: Diagnosis not present

## 2018-08-14 NOTE — Progress Notes (Signed)
BP 127/78   Pulse 96   Temp (!) 97 F (36.1 C) (Oral)   Ht 5' 5.5" (1.664 m)   Wt 267 lb 6.4 oz (121.3 kg)   BMI 43.82 kg/m    Subjective:    Patient ID: Nicole Silva, female    DOB: 15-Nov-1970, 47 y.o.   MRN: [id number]  HPI: Nicole Silva is a 47 y.o. female presenting on 08/14/2018 for Ankle Pain (Right. Patient states that she twisted her ankle yesterday ) and Plantar Warts (Bottom of right foot- 3 month follow up)   HPI Plantar wart treatment Patient is coming in for repeat plantar wart treatment on her right great toe on the joint part of the toe.  She has had treatments before but she has had been having difficulty getting over here so it is been sometime since she has had treatment.  She says it has been improving but now is getting bigger because of the time lapse between the treatments.  She denies any pain or redness or warmth except for when she puts a lot of pressure on that spot.  Ankle sprain Patient says she was out getting her Christmas tree out of the shed and her dogs ran into her and she rolled her ankle and she is having right lateral ankle pain.  She says it has been swelling and hurting more since yesterday.  She has been taking ibuprofen and Aleve for it to help.  She denies any giving way or loss of strength but just that it hurts a lot more when she is on her feet.  She says is also swelling but has not had any bruising.  Relevant past medical, surgical, family and social history reviewed and updated as indicated. Interim medical history since our last visit reviewed. Allergies and medications reviewed and updated.  Review of Systems  Constitutional: Negative for chills and fever.  Eyes: Negative for visual disturbance.  Respiratory: Negative for chest tightness and shortness of breath.   Cardiovascular: Positive for leg swelling. Negative for chest pain.  Musculoskeletal: Positive for arthralgias and joint swelling. Negative for  back pain and gait problem.  Skin: Negative for rash.  Neurological: Negative for dizziness, light-headedness and headaches.  Psychiatric/Behavioral: Negative for agitation and behavioral problems.  All other systems reviewed and are negative.   Per HPI unless specifically indicated above   Allergies as of 08/14/2018      Reactions   Latex Hives   Peach [prunus Persica] Hives   Penicillins Hives   Has patient had a PCN reaction causing immediate rash, facial/tongue/throat swelling, SOB or lightheadedness with hypotension: Yes Has patient had a PCN reaction causing severe rash involving mucus membranes or skin necrosis: No Has patient had a PCN reaction that required hospitalization: No Has patient had a PCN reaction occurring within the last 10 years: No If all of the above answers are "NO", then may proceed with Cephalosporin use.      Medication List        Accurate as of 08/14/18 11:01 AM. Always use your most recent med list.          albuterol 108 (90 Base) MCG/ACT inhaler Commonly known as:  PROVENTIL HFA;VENTOLIN HFA Inhale 2 puffs into the lungs every 6 (six) hours as needed for wheezing or shortness of breath.   albuterol (2.5 MG/3ML) 0.083% nebulizer solution Commonly known as:  PROVENTIL INHALE CONTENTS OF 1 VIAL IN NEBULIZER EVERY 6 HOURS AS NEEDED FOR WHEEZING OR SHORTNESS  OF BREATH   clonazePAM 1 MG tablet Commonly known as:  KLONOPIN Take 1 mg by mouth 4 (four) times daily.   Fluticasone-Salmeterol 100-50 MCG/DOSE Aepb Commonly known as:  ADVAIR Inhale 1 puff into the lungs 2 (two) times daily.   PARoxetine 40 MG tablet Commonly known as:  PAXIL Take 1 tablet by mouth daily.   QUEtiapine 100 MG tablet Commonly known as:  SEROQUEL Take 100 mg by mouth 3 (three) times daily.   QUEtiapine 300 MG tablet Commonly known as:  SEROQUEL Take 400 mg by mouth at bedtime.   TRIUMEQ 600-50-300 MG tablet Generic drug:  abacavir-dolutegravir-lamiVUDine TAKE 1  TABLET BY MOUTH ONCE DAILY   VYVANSE 70 MG capsule Generic drug:  lisdexamfetamine Take 70 mg by mouth 2 (two) times daily.          Objective:    BP 127/78   Pulse 96   Temp (!) 97 F (36.1 C) (Oral)   Ht 5' 5.5" (1.664 m)   Wt 267 lb 6.4 oz (121.3 kg)   BMI 43.82 kg/m   Wt Readings from Last 3 Encounters:  08/14/18 267 lb 6.4 oz (121.3 kg)  06/01/18 262 lb 12.8 oz (119.2 kg)  04/10/18 262 lb (118.8 kg)    Physical Exam  Constitutional: She is oriented to person, place, and time. She appears well-developed and well-nourished. No distress.  Eyes: Conjunctivae are normal.  Musculoskeletal:       Right ankle: She exhibits swelling. She exhibits normal range of motion, no ecchymosis and no deformity. Tenderness. AITFL tenderness found.  Neurological: She is alert and oriented to person, place, and time. Coordination normal.  Skin: Skin is warm and dry. No rash noted. She is not diaphoretic.  Dime sized plantar wart on her right great toe overlying proximal interphalangeal joint  Psychiatric: She has a normal mood and affect. Her behavior is normal.  Nursing note and vitals reviewed.  Right ankle x-ray: No signs of acute bony abnormality, soft tissue swelling  Cryotherapy of plantar wart on right great toe: Verbal consent obtained.  Liquid nitrogen applied in 3-10-second bursts.  Patient tolerated well, come back in 2 to 3 weeks    Assessment & Plan:   Problem List Items Addressed This Visit    None    Visit Diagnoses    Sprain of anterior talofibular ligament of right ankle, initial encounter    -  Primary   Relevant Orders   DG Ankle Complete Right (Completed)   Plantar wart           Follow up plan: Return in about 3 weeks (around 09/04/2018), or if symptoms worsen or fail to improve, for Repeat plantar wart treatment.  Counseling provided for all of the vaccine components Orders Placed This Encounter  Procedures  . DG Ankle Complete Right    Caryl Pina, MD [facility] 08/14/2018, 11:01 AM

## 2018-08-30 ENCOUNTER — Ambulatory Visit (INDEPENDENT_AMBULATORY_CARE_PROVIDER_SITE_OTHER): Payer: Medicaid Other | Admitting: Family Medicine

## 2018-08-30 ENCOUNTER — Encounter: Payer: Self-pay | Admitting: Family Medicine

## 2018-08-30 VITALS — BP 136/92 | HR 107 | Temp 96.7°F | Ht 65.5 in | Wt 265.0 lb

## 2018-08-30 DIAGNOSIS — B070 Plantar wart: Secondary | ICD-10-CM

## 2018-08-30 DIAGNOSIS — M7061 Trochanteric bursitis, right hip: Secondary | ICD-10-CM

## 2018-08-30 MED ORDER — VYVANSE 70 MG PO CAPS: 70.0000 mg | ORAL_CAPSULE | Freq: Two times a day (BID) | ORAL | 0 refills | Status: DC

## 2018-08-30 MED ORDER — METHYLPREDNISOLONE ACETATE 80 MG/ML IJ SUSP
80.0000 mg | Freq: Once | INTRAMUSCULAR | Status: AC
  Administered 2018-08-30: 80 mg via INTRAMUSCULAR

## 2018-08-30 NOTE — Progress Notes (Signed)
BP (!) 136/92   Pulse (!) 107   Temp (!) 96.7 F (35.9 C) (Oral)   Ht 5' 5.5" (1.664 m)   Wt 265 lb (120.2 kg)   BMI 43.43 kg/m    Subjective:    Patient ID: Nicole Silva, female    DOB: 1971/03/12, 47 y.o.   MRN: [id number]  HPI: Nicole Silva is a 47 y.o. female presenting on 08/30/2018 for Plantar Warts (2 week follow up. Patient states that her psch has been in the hospital and she is wanting to know if Dr. Keturah Barre can refill her Vyvanse )   HPI Wart retreatment Patient is coming in for wart retreatment on a plantar wart on her right great toe that we have treated at least 3 times previously.  She says it is looking better and flatter but is still there and it does bother her when she steps on it and it will irritate sometimes.  Patient wants to know if we can do a refill for the Vyvanse and she pulled up her psychologist office and they explained that the psychologist is out of town currently and if we could fill it just the one time until they return.  Patient has right hip pain and bursitis and did have an injection like she is had previously to see if they can help with this.  She says it is been flaring up over the past month again.  She denies any numbness or weakness in that leg or anywhere else.  Relevant past medical, surgical, family and social history reviewed and updated as indicated. Interim medical history since our last visit reviewed. Allergies and medications reviewed and updated.  Review of Systems  Constitutional: Negative for chills and fever.  Eyes: Negative for visual disturbance.  Respiratory: Negative for chest tightness and shortness of breath.   Cardiovascular: Negative for chest pain and leg swelling.  Musculoskeletal: Negative for back pain and gait problem.  Skin: Negative for rash.  Neurological: Negative for dizziness, light-headedness and headaches.  Psychiatric/Behavioral: Negative for agitation and behavioral problems.    All other systems reviewed and are negative.   Per HPI unless specifically indicated above   Allergies as of 08/30/2018      Reactions   Latex Hives   Peach [prunus Persica] Hives   Penicillins Hives   Has patient had a PCN reaction causing immediate rash, facial/tongue/throat swelling, SOB or lightheadedness with hypotension: Yes Has patient had a PCN reaction causing severe rash involving mucus membranes or skin necrosis: No Has patient had a PCN reaction that required hospitalization: No Has patient had a PCN reaction occurring within the last 10 years: No If all of the above answers are "NO", then may proceed with Cephalosporin use.      Medication List       Accurate as of August 30, 2018 11:58 AM. Always use your most recent med list.        albuterol 108 (90 Base) MCG/ACT inhaler Commonly known as:  PROVENTIL HFA;VENTOLIN HFA Inhale 2 puffs into the lungs every 6 (six) hours as needed for wheezing or shortness of breath.   albuterol (2.5 MG/3ML) 0.083% nebulizer solution Commonly known as:  PROVENTIL INHALE CONTENTS OF 1 VIAL IN NEBULIZER EVERY 6 HOURS AS NEEDED FOR WHEEZING OR SHORTNESS OF BREATH   clonazePAM 1 MG tablet Commonly known as:  KLONOPIN Take 1 mg by mouth 4 (four) times daily.   Fluticasone-Salmeterol 100-50 MCG/DOSE Aepb Commonly known as:  ADVAIR Inhale  1 puff into the lungs 2 (two) times daily.   PARoxetine 40 MG tablet Commonly known as:  PAXIL Take 1 tablet by mouth daily.   QUEtiapine 100 MG tablet Commonly known as:  SEROQUEL Take 100 mg by mouth 3 (three) times daily.   QUEtiapine 300 MG tablet Commonly known as:  SEROQUEL Take 400 mg by mouth at bedtime.   TRIUMEQ 600-50-300 MG tablet Generic drug:  abacavir-dolutegravir-lamiVUDine TAKE 1 TABLET BY MOUTH ONCE DAILY   VYVANSE 70 MG capsule Generic drug:  lisdexamfetamine Take 1 capsule (70 mg total) by mouth 2 (two) times daily. This is a one-time order because her  psychiatrist is out of country          Objective:    BP (!) 136/92   Pulse (!) 107   Temp (!) 96.7 F (35.9 C) (Oral)   Ht 5' 5.5" (1.664 m)   Wt 265 lb (120.2 kg)   BMI 43.43 kg/m   Wt Readings from Last 3 Encounters:  08/30/18 265 lb (120.2 kg)  08/14/18 267 lb 6.4 oz (121.3 kg)  06/01/18 262 lb 12.8 oz (119.2 kg)    Physical Exam Vitals signs and nursing note reviewed.  Constitutional:      General: She is not in acute distress.    Appearance: She is well-developed. She is not diaphoretic.  Cardiovascular:     Rate and Rhythm: Normal rate and regular rhythm.     Heart sounds: Normal heart sounds. No murmur.  Pulmonary:     Effort: Pulmonary effort is normal. No respiratory distress.     Breath sounds: Normal breath sounds. No wheezing.  Skin:    General: Skin is warm and dry.     Findings: Lesion (Plantar wart still about the same size but much flatter on the right great toe) present. No rash.  Neurological:     Mental Status: She is alert and oriented to person, place, and time.     Coordination: Coordination normal.  Psychiatric:        Behavior: Behavior normal.     Wart cryotherapy:Used liquid nitrogen in 3-10-second bursts on wart.  Patient tolerated well, gave instructions on what to watch for blistering and keeping the wound site clean.     Assessment & Plan:   Problem List Items Addressed This Visit    None    Visit Diagnoses    Plantar wart    -  Primary   Trochanteric bursitis of right hip       Relevant Medications   methylPREDNISolone acetate (DEPO-MEDROL) injection 80 mg (Start on 08/30/2018 12:00 PM)      Patient has a psychiatrist who is out of country and we spoke with their office and they Laurie Panda doing a one-time refill on the Vyvanse and the psychiatrist should be back next month  Give steroid injection for bursitis Follow up plan: Return if symptoms worsen or fail to improve, for Return in 2 to 3 weeks for retreatment of  wart.  Counseling provided for all of the vaccine components No orders of the defined types were placed in this encounter.   Caryl Pina, MD [facility] 08/30/2018, 11:58 AM

## 2018-09-04 MED FILL — TRIUMEQ 600-50-300 MG TABS: 600-50-300 | 30 days supply | Qty: 30 | Fill #4

## 2018-09-27 ENCOUNTER — Other Ambulatory Visit: Payer: Medicaid Other

## 2018-09-27 ENCOUNTER — Ambulatory Visit: Payer: Medicaid Other | Admitting: Family Medicine

## 2018-09-27 ENCOUNTER — Encounter: Payer: Self-pay | Admitting: Family Medicine

## 2018-09-27 VITALS — BP 149/93 | HR 92 | Temp 96.7°F | Ht 65.5 in | Wt 268.4 lb

## 2018-09-27 DIAGNOSIS — B070 Plantar wart: Secondary | ICD-10-CM

## 2018-09-27 MED ORDER — QUETIAPINE FUMARATE 400 MG PO TABS: 400.0000 mg | ORAL_TABLET | Freq: Two times a day (BID) | ORAL | 0 refills | Status: DC

## 2018-09-27 MED ORDER — LISDEXAMFETAMINE DIMESYLATE 70 MG PO CAPS: 70.0000 mg | ORAL_CAPSULE | Freq: Two times a day (BID) | ORAL | 0 refills | Status: AC

## 2018-09-27 MED ORDER — LISDEXAMFETAMINE DIMESYLATE 70 MG PO CAPS: 70.0000 mg | ORAL_CAPSULE | Freq: Every day | ORAL | 0 refills | Status: DC

## 2018-09-27 MED ORDER — CLONAZEPAM 1 MG PO TABS: 1.0000 mg | ORAL_TABLET | Freq: Two times a day (BID) | ORAL | 0 refills | Status: DC | PRN

## 2018-09-27 NOTE — Progress Notes (Signed)
BP (!) 149/93   Pulse 92   Temp (!) 96.7 F (35.9 C) (Oral)   Ht 5' 5.5" (1.664 m)   Wt 268 lb 6.4 oz (121.7 kg)   BMI 43.98 kg/m    Subjective:    Patient ID: Nicole Silva, female    DOB: 1971/05/24, 48 y.o.   MRN: [id number]  HPI: Nicole Silva is a 48 y.o. female presenting on 09/27/2018 for Plantar Warts (re check . Right foot)   HPI Plantar wart recheck Patient is coming in today for repeat check for a plantar wart on her right great toe.  This is about 6 treatment that she is coming for including 2 times to scraped it and we have used cryotherapy 6 times total.  It is reducing in size and the pain associated with it has significantly reduced as well.  She is coming in to continue this treatment.  Relevant past medical, surgical, family and social history reviewed and updated as indicated. Interim medical history since our last visit reviewed. Allergies and medications reviewed and updated.  Review of Systems  Constitutional: Negative for chills and fever.  Eyes: Negative for visual disturbance.  Respiratory: Negative for chest tightness and shortness of breath.   Cardiovascular: Negative for chest pain and leg swelling.  Musculoskeletal: Negative for back pain and gait problem.  Skin: Negative for rash.  Neurological: Negative for light-headedness and headaches.  Psychiatric/Behavioral: Negative for agitation and behavioral problems.  All other systems reviewed and are negative.   Per HPI unless specifically indicated above        Objective:    BP (!) 149/93   Pulse 92   Temp (!) 96.7 F (35.9 C) (Oral)   Ht 5' 5.5" (1.664 m)   Wt 268 lb 6.4 oz (121.7 kg)   BMI 43.98 kg/m   Wt Readings from Last 3 Encounters:  09/27/18 268 lb 6.4 oz (121.7 kg)  08/30/18 265 lb (120.2 kg)  08/14/18 267 lb 6.4 oz (121.3 kg)    Physical Exam Vitals signs and nursing note reviewed.  Constitutional:      General: She is not in acute distress.  Appearance: She is well-developed. She is not diaphoretic.  Skin:    General: Skin is warm and dry.     Findings: Lesion (Right great plantar wart, left deep and less wide than what it was previously.) present. No rash.  Neurological:     Mental Status: She is alert and oriented to person, place, and time.     Coordination: Coordination normal.  Psychiatric:        Behavior: Behavior normal.     Wart cryotherapy: Used liquid nitrogen in 3-10-second bursts on wart.  Patient tolerated well, gave instructions on what to watch for blistering and keeping the wound site clean.     Assessment & Plan:   Problem List Items Addressed This Visit    None    Visit Diagnoses    Plantar wart    -  Primary      Should see psychiatry but is asking for refill for her medications and one-time from psychiatry because there was a lapse of medications, she will go back to psychiatry and have them manage them from here on out in the future. Follow up plan: Return in about 3 weeks (around 10/18/2018), or if symptoms worsen or fail to improve, for Repeat wart treatment.  Counseling provided for all of the vaccine components No orders of the defined types were  placed in this encounter.   Caryl Pina, MD [facility] 09/27/2018, 2:48 PM

## 2018-09-27 NOTE — Addendum Note (Signed)
Addended by: Caryl Pina on: 09/27/2018 02:55 PM   Modules accepted: Orders

## 2018-09-29 MED FILL — TRIUMEQ 600-50-300 MG TABS: 600-50-300 | 30 days supply | Qty: 30 | Fill #5

## 2018-10-11 ENCOUNTER — Encounter: Payer: Medicaid Other | Admitting: Infectious Diseases

## 2018-10-17 ENCOUNTER — Ambulatory Visit: Payer: Medicaid Other | Admitting: Infectious Diseases

## 2018-10-19 ENCOUNTER — Ambulatory Visit: Payer: Medicaid Other | Admitting: Family Medicine

## 2018-10-19 ENCOUNTER — Telehealth: Payer: Self-pay

## 2018-10-19 ENCOUNTER — Other Ambulatory Visit: Payer: Self-pay | Admitting: Family Medicine

## 2018-10-19 DIAGNOSIS — J441 Chronic obstructive pulmonary disease with (acute) exacerbation: Secondary | ICD-10-CM

## 2018-10-19 DIAGNOSIS — J439 Emphysema, unspecified: Secondary | ICD-10-CM

## 2018-10-19 MED ORDER — AZITHROMYCIN 250 MG PO TABS: ORAL_TABLET | ORAL | 0 refills | Status: DC

## 2018-10-19 MED ORDER — PREDNISONE 20 MG PO TABS: ORAL_TABLET | ORAL | 0 refills | Status: DC

## 2018-10-19 NOTE — Addendum Note (Signed)
Addended by: Caryl Pina on: 10/19/2018 04:33 PM   Modules accepted: Orders

## 2018-10-19 NOTE — Telephone Encounter (Signed)
Patient states that she has been having sob, cough and nasal congestion that has been going on x 1 week.  Patient states she was not able to come to apt today by could not come due to death in family. Would like something called into [facility].

## 2018-10-19 NOTE — Telephone Encounter (Signed)
Sent azithromycin and prednisone for the patient because of her immune depressed state

## 2018-10-20 NOTE — Telephone Encounter (Signed)
Last seen 09/27/18

## 2018-10-23 ENCOUNTER — Other Ambulatory Visit: Payer: Self-pay | Admitting: Infectious Diseases

## 2018-10-25 ENCOUNTER — Encounter: Payer: Self-pay | Admitting: Family Medicine

## 2018-10-25 ENCOUNTER — Ambulatory Visit (INDEPENDENT_AMBULATORY_CARE_PROVIDER_SITE_OTHER): Payer: Medicaid Other | Admitting: Family Medicine

## 2018-10-25 VITALS — BP 132/87 | HR 91 | Temp 98.0°F | Ht 65.5 in | Wt 263.0 lb

## 2018-10-25 DIAGNOSIS — B070 Plantar wart: Secondary | ICD-10-CM

## 2018-10-25 MED ORDER — TRAZODONE HCL 300 MG PO TABS: 300.0000 mg | ORAL_TABLET | Freq: Every evening | ORAL | 0 refills | Status: DC | PRN

## 2018-10-25 NOTE — Progress Notes (Signed)
   BP 132/87   Pulse 91   Temp 98 F (36.7 C) (Oral)   Ht 5' 5.5" (1.664 m)   Wt 119.3 kg   BMI 43.10 kg/m    Subjective:    Patient ID: Nicole Silva, female    DOB: 1970/11/05, 48 y.o.   MRN: [id number]  HPI: Nicole Silva is a 48 y.o. female presenting on 10/25/2018 for Plantar Warts  Patient is coming in for repeat cryotherapy for plantar wart on her right great toe. It is smaller and more thin than it was on the last visit 09/27/18. It measures about 1cm in diameter and has been treated multiple times in the previous months.  Patient also wants a temporary supply of Trazodone because she left her medications at the HIV clinic and they shipped them to her but she has still not received them yet. She has been unable to sleep for the last few nights.  Relevant past medical, surgical, family and social history reviewed and updated as indicated. Interim medical history since our last visit reviewed. Allergies and medications reviewed and updated.   Review of Systems  Constitutional: Negative for chills and fever.  Respiratory: Negative for chest tightness and shortness of breath.   Cardiovascular: Negative for chest pain and leg swelling.  Skin: Negative for rash.  Neurological: Negative for light-headedness and headaches.  All other systems reviewed and are negative.   Per HPI unless specifically indicated above      Objective:    BP 132/87   Pulse 91   Temp 98 F (36.7 C) (Oral)   Ht 5' 5.5" (1.664 m)   Wt 119.3 kg   BMI 43.10 kg/m     Physical Exam Constitutional:      General: She is not in acute distress.    Appearance: Normal appearance.  Feet:     Right foot:     Skin integrity: No ulcer or erythema.     Comments: Wart on bottom of right great toe, 1cm diameter Skin:    General: Skin is warm and dry.  Neurological:     Mental Status: She is oriented to person, place, and time.  Psychiatric:        Behavior: Behavior normal.     Wart cryotherapy: Used liquid nitrogen in 3 10-second bursts on plantar wart. Patient tolerated the procedure well. She was given a bandage and instructions to keep the wound site clean.     Assessment & Plan:   - Will give Trazodone 300mg  #7 for sleep  Problem List Items Addressed This Visit    None    Visit Diagnoses    Plantar wart    -  Primary       Follow up plan: Return in about 3 weeks (around 11/15/2018), or if symptoms worsen or fail to improve, for Wart retreatment.  Counseling provided for all of the vaccine components No orders of the defined types were placed in this encounter.   [place], PA-S [facility] 10/25/2018, 12:13 PM  I was personally present for all components of the history, physical exam and/or medical decision making.  I agree with the documentation performed by the PA student and agree with assessment and plan above.  Caryl Pina, MD [facility] 10/29/2018, 10:04 PM

## 2018-10-26 MED FILL — TRIUMEQ 600-50-300 MG TABS: 600-50-300 | 30 days supply | Qty: 30 | Fill #0

## 2018-10-31 ENCOUNTER — Other Ambulatory Visit: Payer: Medicaid Other

## 2018-10-31 ENCOUNTER — Other Ambulatory Visit (HOSPITAL_COMMUNITY)
Admission: RE | Admit: 2018-10-31 | Discharge: 2018-10-31 | Disposition: A | Discharge status: Home / Self Care | Payer: Medicaid Other | Source: Ambulatory Visit | Attending: Infectious Diseases | Admitting: Infectious Diseases

## 2018-10-31 DIAGNOSIS — Z79899 Other long term (current) drug therapy: Secondary | ICD-10-CM

## 2018-10-31 DIAGNOSIS — Z113 Encounter for screening for infections with a predominantly sexual mode of transmission: Secondary | ICD-10-CM

## 2018-10-31 DIAGNOSIS — B20 Human immunodeficiency virus [HIV] disease: Secondary | ICD-10-CM

## 2018-11-01 LAB — T-HELPER CELL (CD4) - (RCID CLINIC ONLY)
CD4 % Helper T Cell: 6 % — ABNORMAL LOW (ref 33–55)
CD4 T Cell Abs: 130 /uL — ABNORMAL LOW (ref 400–2700)

## 2018-11-01 LAB — URINE CYTOLOGY ANCILLARY ONLY
Chlamydia: NEGATIVE
Neisseria Gonorrhea: NEGATIVE

## 2018-11-02 LAB — COMPREHENSIVE METABOLIC PANEL
AG Ratio: 1.1 (calc) (ref 1.0–2.5)
ALT: 16 U/L (ref 6–29)
AST: 15 U/L (ref 10–35)
Albumin: 3.9 g/dL (ref 3.6–5.1)
Alkaline phosphatase (APISO): 66 U/L (ref 31–125)
BUN: 10 mg/dL (ref 7–25)
CO2: 23 mmol/L (ref 20–32)
Calcium: 9 mg/dL (ref 8.6–10.2)
Chloride: 101 mmol/L (ref 98–110)
Creat: 0.91 mg/dL (ref 0.50–1.10)
Globulin: 3.5 g/dL (calc) (ref 1.9–3.7)
Glucose, Bld: 163 mg/dL — ABNORMAL HIGH (ref 65–99)
Potassium: 4 mmol/L (ref 3.5–5.3)
Sodium: 135 mmol/L (ref 135–146)
Total Bilirubin: 0.3 mg/dL (ref 0.2–1.2)
Total Protein: 7.4 g/dL (ref 6.1–8.1)

## 2018-11-02 LAB — CBC
HCT: 40.9 % (ref 35.0–45.0)
Hemoglobin: 14.4 g/dL (ref 11.7–15.5)
MCH: 35.2 pg — ABNORMAL HIGH (ref 27.0–33.0)
MCHC: 35.2 g/dL (ref 32.0–36.0)
MCV: 100 fL (ref 80.0–100.0)
MPV: 10.4 fL (ref 7.5–12.5)
Platelets: 234 10*3/uL (ref 140–400)
RBC: 4.09 10*6/uL (ref 3.80–5.10)
RDW: 13.5 % (ref 11.0–15.0)
WBC: 5.8 10*3/uL (ref 3.8–10.8)

## 2018-11-02 LAB — LIPID PANEL
Cholesterol: 184 mg/dL (ref <200)
HDL: 33 mg/dL — ABNORMAL LOW (ref >=50)
LDL Cholesterol (Calc): 104 mg/dL (calc) — ABNORMAL HIGH
Non-HDL Cholesterol (Calc): 151 mg/dL (calc) — ABNORMAL HIGH (ref <130)
Total CHOL/HDL Ratio: 5.6 (calc) — ABNORMAL HIGH (ref <5.0)
Triglycerides: 350 mg/dL — ABNORMAL HIGH (ref <150)

## 2018-11-02 LAB — HIV-1 RNA QUANT-NO REFLEX-BLD
HIV 1 RNA Quant: 2750 copies/mL — ABNORMAL HIGH
HIV-1 RNA Quant, Log: 3.44 Log copies/mL — ABNORMAL HIGH

## 2018-11-02 LAB — RPR: RPR Ser Ql: REACTIVE — AB

## 2018-11-02 LAB — RPR TITER: RPR Titer: 1:1 {titer} — ABNORMAL HIGH

## 2018-11-02 LAB — FLUORESCENT TREPONEMAL AB(FTA)-IGG-BLD: Fluorescent Treponemal ABS: REACTIVE — AB

## 2018-11-15 ENCOUNTER — Encounter: Payer: Medicaid Other | Admitting: Infectious Diseases

## 2018-11-16 ENCOUNTER — Other Ambulatory Visit: Payer: Self-pay | Admitting: Infectious Diseases

## 2018-11-22 MED FILL — TRIUMEQ 600-50-300 MG TABS: 600-50-300 | 30 days supply | Qty: 30 | Fill #0

## 2018-11-24 ENCOUNTER — Ambulatory Visit: Payer: Medicaid Other | Admitting: Family Medicine

## 2018-11-27 ENCOUNTER — Encounter: Payer: Medicaid Other | Admitting: Infectious Diseases

## 2018-11-29 ENCOUNTER — Other Ambulatory Visit: Payer: Self-pay

## 2018-11-29 ENCOUNTER — Encounter: Payer: Self-pay | Admitting: Infectious Diseases

## 2018-11-29 ENCOUNTER — Ambulatory Visit (INDEPENDENT_AMBULATORY_CARE_PROVIDER_SITE_OTHER): Payer: Medicaid Other | Admitting: Infectious Diseases

## 2018-11-29 DIAGNOSIS — J439 Emphysema, unspecified: Secondary | ICD-10-CM | POA: Diagnosis not present

## 2018-11-29 DIAGNOSIS — Z23 Encounter for immunization: Secondary | ICD-10-CM

## 2018-11-29 DIAGNOSIS — D071 Carcinoma in situ of vulva: Secondary | ICD-10-CM | POA: Diagnosis not present

## 2018-11-29 DIAGNOSIS — B20 Human immunodeficiency virus [HIV] disease: Secondary | ICD-10-CM

## 2018-11-29 NOTE — Assessment & Plan Note (Signed)
Will check genotype Given pill box.  Got condoms today rtc in 2 months.

## 2018-11-29 NOTE — Progress Notes (Signed)
   Subjective:    Patient ID: Nicole Silva, female    DOB: 1971/08/22, 48 y.o.   MRN: [id number]  HPI 48yo F with HIV+, treated Hep C (VL - 06-2015), cough variant asthma, obesity. Still smoking, trying to cut back, down to 1/2 ppd (11-29-18).  Has been drug free for 6 yrs. Ondescovy/tivicay-->triumeq (changed 10-2016).  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 SHe had GYN f/u 12-2017 and got depo injection. f/u 1 year. Having mood swings due to "change in life".  Prev noted a lump on her R breast. mammo 07-2018 negative.   She relates her recent increase in BP to stress, had to put out her boyfriend. Improved today.  Denies missed ART.  Asks for pill box. Had 3 deaths in her family consecutively. Has had changes in her psych meds around this time.    HIV 1 RNA Quant (copies/mL)  Date Value  10/31/2018 2,750 (H)  04/10/2018 28,600 (H)  09/26/2017 37 (H)   CD4 T Cell Abs (/uL)  Date Value  10/31/2018 130 (L)  04/10/2018 150 (L)  09/26/2017 240 (L)    Review of Systems  Constitutional: Negative for appetite change and unexpected weight change.  Respiratory: Positive for cough. Negative for shortness of breath.   Cardiovascular: Negative for leg swelling.  Gastrointestinal: Negative for constipation, diarrhea, nausea and vomiting.  Psychiatric/Behavioral: Positive for dysphoric mood.   Please see HPI. All other systems reviewed and negative.     Objective:   Physical Exam Constitutional:      Appearance: Normal appearance. She is obese.  HENT:     Mouth/Throat:     Mouth: Mucous membranes are moist.     Pharynx: No oropharyngeal exudate.  Eyes:     Extraocular Movements: Extraocular movements intact.     Pupils: Pupils are equal, round, and reactive to light.  Neck:     Musculoskeletal: Normal range of motion and neck supple.  Cardiovascular:     Rate and Rhythm: Normal rate and  regular rhythm.  Pulmonary:     Effort: Pulmonary effort is normal.     Breath sounds: Normal breath sounds.  Abdominal:     General: Bowel sounds are normal. There is no distension.     Palpations: Abdomen is soft.  Musculoskeletal:     Right lower leg: No edema.     Left lower leg: No edema.  Neurological:     General: No focal deficit present.     Mental Status: She is alert.  Psychiatric:        Mood and Affect: Mood normal.           Assessment & Plan:

## 2018-11-29 NOTE — Assessment & Plan Note (Signed)
She will call GYN for f/u appt.

## 2018-11-29 NOTE — Assessment & Plan Note (Signed)
Breathing stable, has enough of her inhalers.

## 2018-12-14 LAB — HIV-1 INTEGRASE GENOTYPE: Date Viral Load Collected: 3182020

## 2018-12-14 LAB — HIV-1 RNA ULTRAQUANT REFLEX TO GENTYP+
HIV 1 RNA Quant: 16500 copies/mL — ABNORMAL HIGH
HIV-1 RNA Quant, Log: 4.22 Log copies/mL — ABNORMAL HIGH

## 2018-12-14 LAB — HIV-1 GENOTYPE: HIV-1 Genotype: DETECTED — AB

## 2018-12-16 ENCOUNTER — Other Ambulatory Visit: Payer: Self-pay | Admitting: Infectious Diseases

## 2018-12-16 DIAGNOSIS — B20 Human immunodeficiency virus [HIV] disease: Secondary | ICD-10-CM

## 2018-12-18 MED FILL — TRIUMEQ 600-50-300 MG TABS: 600-50-300 | 30 days supply | Qty: 30 | Fill #0

## 2018-12-26 ENCOUNTER — Telehealth: Payer: Self-pay | Admitting: Family Medicine

## 2018-12-26 NOTE — Telephone Encounter (Signed)
PT states that she is needing vyvanse, seroquel, klonopin she states that her mental health office is trying to find another provider bc the one she has is never there, can this be a telephone visit if we are not able to call in this medication?    Pharmacy [facility]

## 2018-12-26 NOTE — Telephone Encounter (Signed)
Spoke with patient- she states her mental health office asked her to request refills from PCP for this month because they are changing her to a new provider there that will start next week.  Scheduled patient for telephone visit Dr. Warrick Parisian 12/29/2018.

## 2018-12-26 NOTE — Telephone Encounter (Signed)
Left message to call back  

## 2018-12-26 NOTE — Telephone Encounter (Signed)
PT RTN NURSE CALL

## 2018-12-28 ENCOUNTER — Telehealth: Payer: Self-pay | Admitting: Pharmacist

## 2018-12-28 NOTE — Telephone Encounter (Signed)
Called patient to discuss recent lab work drawn at office visit with Dr. Johnnye Sima.  Her viral load is up to 16,500.  Left HIPAA compliant VM for her to call me back to discuss adherence.

## 2018-12-29 ENCOUNTER — Ambulatory Visit (INDEPENDENT_AMBULATORY_CARE_PROVIDER_SITE_OTHER): Payer: Medicaid Other | Admitting: Family Medicine

## 2018-12-29 ENCOUNTER — Encounter: Payer: Self-pay | Admitting: Family Medicine

## 2018-12-29 ENCOUNTER — Other Ambulatory Visit: Payer: Self-pay

## 2018-12-29 DIAGNOSIS — F3181 Bipolar II disorder: Secondary | ICD-10-CM

## 2018-12-29 DIAGNOSIS — F339 Major depressive disorder, recurrent, unspecified: Secondary | ICD-10-CM

## 2018-12-29 MED ORDER — QUETIAPINE FUMARATE 400 MG PO TABS: 400.0000 mg | ORAL_TABLET | Freq: Two times a day (BID) | ORAL | 0 refills | Status: DC

## 2018-12-29 MED ORDER — FLUTICASONE PROPIONATE 50 MCG/ACT NA SUSP: 1.0000 | Freq: Two times a day (BID) | NASAL | 6 refills | Status: AC | PRN

## 2018-12-29 MED ORDER — VYVANSE 70 MG PO CAPS: 70.0000 mg | ORAL_CAPSULE | Freq: Two times a day (BID) | ORAL | 0 refills | Status: DC

## 2018-12-29 MED ORDER — CLONAZEPAM 1 MG PO TABS: 1.0000 mg | ORAL_TABLET | Freq: Three times a day (TID) | ORAL | 0 refills | Status: DC | PRN

## 2018-12-29 NOTE — Progress Notes (Signed)
Virtual Visit via telephone Note  I connected with Nicole Silva on 12/29/18 at 1107 by telephone and verified that I am speaking with the correct person using two identifiers. Nicole Silva is currently located at home and no other people are currently with her during visit. The provider, Fransisca Kaufmann Dettinger, MD is located in their office at time of visit.  Call ended at 1116  I discussed the limitations, risks, security and privacy concerns of performing an evaluation and management service by telephone and the availability of in person appointments. I also discussed with the patient that there may be a patient responsible charge related to this service. The patient expressed understanding and agreed to proceed.   History and Present Illness: Patient is calling in because she is doing transition between psychiatrist because her other one was let go from the job and she will see her new psychiatrist on May 3 and needs a bridge of her medications that she is currently taking from the psychiatrist.  She currently takes the Vyvanse and the Seroquel and the clonazepam and we will send refills for those.  For 1 month.  She denies any suicidal ideations or thoughts of hurting herself.  She has been mostly staying at home because she is HIV positive and AIDS positive and trying to stay away from the coronavirus.  That is why this was a telephone visit for her today.  No diagnosis found.  Outpatient Encounter Medications as of 12/29/2018  Medication Sig  . albuterol (PROVENTIL HFA;VENTOLIN HFA) 108 (90 Base) MCG/ACT inhaler Inhale 2 puffs into the lungs every 6 (six) hours as needed for wheezing or shortness of breath.  Marland Kitchen albuterol (PROVENTIL) (2.5 MG/3ML) 0.083% nebulizer solution INHALE CONTENTS OF 1 VIAL IN NEBULIZER EVERY 6 HOURS AS NEEDED FOR WHEEZING OR SHORTNESS OF BREATH  . clonazePAM (KLONOPIN) 1 MG tablet Take 1 mg by mouth 3 (three) times daily as needed.   .  clonazePAM (KLONOPIN) 1 MG tablet Take 1 tablet (1 mg total) by mouth 2 (two) times daily as needed for anxiety.  . Fluticasone-Salmeterol (ADVAIR) 100-50 MCG/DOSE AEPB Inhale 1 puff into the lungs 2 (two) times daily.  Marland Kitchen lisdexamfetamine (VYVANSE) 70 MG capsule Take 1 capsule (70 mg total) by mouth 2 (two) times daily.  . QUEtiapine (SEROQUEL) 400 MG tablet Take 1 tablet (400 mg total) by mouth 2 (two) times daily.  . traZODone (DESYREL) 300 MG tablet Take 1 tablet (300 mg total) by mouth at bedtime as needed for sleep.  . TRIUMEQ 600-50-300 MG tablet TAKE 1 TABLET BY MOUTH ONCE DAILY  . VYVANSE 70 MG capsule Take 1 capsule (70 mg total) by mouth 2 (two) times daily. This is a one-time order because her psychiatrist is [place] of [place]   No facility-administered encounter medications on file as of 12/29/2018.     Review of Systems  Constitutional: Negative for chills and fever.  HENT: Negative for congestion, ear discharge and ear pain.   Eyes: Negative for visual disturbance.  Respiratory: Negative for chest tightness and shortness of breath.   Cardiovascular: Negative for chest pain and leg swelling.  Musculoskeletal: Negative for back pain and gait problem.  Skin: Negative for rash.  Neurological: Negative for light-headedness and headaches.  Psychiatric/Behavioral: Positive for sleep disturbance. Negative for agitation, behavioral problems, dysphoric mood, self-injury and suicidal ideas. The patient is not nervous/anxious.   All other systems reviewed and are negative.   Observations/Objective: Patient sounds comfortable and in no acute  distress  Assessment and Plan: Problem List Items Addressed This Visit      Other   Depression, recurrent ([place]) - Primary   Bipolar 2 disorder, major depressive episode ([place])       Follow Up Instructions:  This was a one time refill for her change in psychiatry   I discussed the assessment and treatment plan with the patient. The patient was  provided an opportunity to ask questions and all were answered. The patient agreed with the plan and demonstrated an understanding of the instructions.   The patient was advised to call back or seek an in-person evaluation if the symptoms worsen or if the condition fails to improve as anticipated.  The above assessment and management plan was discussed with the patient. The patient verbalized understanding of and has agreed to the management plan. Patient is aware to call the clinic if symptoms persist or worsen. Patient is aware when to return to the clinic for a follow-up visit. Patient educated on when it is appropriate to go to the emergency department.    I provided 9 minutes of non-face-to-face time during this encounter.    Worthy Rancher, MD

## 2019-01-12 MED FILL — TRIUMEQ 600-50-300 MG TABS: 600-50-300 | 30 days supply | Qty: 30 | Fill #1

## 2019-02-08 MED FILL — TRIUMEQ 600-50-300 MG TABS: 600-50-300 | 30 days supply | Qty: 30 | Fill #2

## 2019-03-05 MED FILL — TRIUMEQ 600-50-300 MG TABS: 600-50-300 | 30 days supply | Qty: 30 | Fill #3

## 2019-03-29 ENCOUNTER — Other Ambulatory Visit: Payer: Self-pay | Admitting: Infectious Diseases

## 2019-03-29 ENCOUNTER — Telehealth: Payer: Self-pay

## 2019-03-29 DIAGNOSIS — B20 Human immunodeficiency virus [HIV] disease: Secondary | ICD-10-CM

## 2019-03-29 NOTE — Telephone Encounter (Signed)
Attempted to call patient to schedule overdue appointment with Dr. Johnnye Sima. Unable to reach patient at this time nor leave voicemail. [place]

## 2019-04-02 MED FILL — TRIUMEQ 600-50-300 MG TABS: 600-50-300 | 30 days supply | Qty: 30 | Fill #0

## 2019-04-30 MED FILL — TRIUMEQ 600-50-300 MG TABS: 600-50-300 | 30 days supply | Qty: 30 | Fill #1

## 2019-05-25 ENCOUNTER — Other Ambulatory Visit: Payer: Self-pay

## 2019-05-28 MED FILL — TRIUMEQ 600-50-300 MG TABS: 600-50-300 | 30 days supply | Qty: 30 | Fill #2

## 2019-06-15 ENCOUNTER — Other Ambulatory Visit: Payer: Self-pay | Admitting: *Deleted

## 2019-06-15 DIAGNOSIS — B20 Human immunodeficiency virus [HIV] disease: Secondary | ICD-10-CM

## 2019-06-21 ENCOUNTER — Other Ambulatory Visit: Payer: Medicaid Other

## 2019-06-21 MED FILL — TRIUMEQ 600-50-300 MG TABS: 600-50-300 | 30 days supply | Qty: 30 | Fill #3

## 2019-06-22 ENCOUNTER — Other Ambulatory Visit: Payer: Medicaid Other

## 2019-06-22 ENCOUNTER — Other Ambulatory Visit: Payer: Self-pay

## 2019-06-22 DIAGNOSIS — B20 Human immunodeficiency virus [HIV] disease: Secondary | ICD-10-CM

## 2019-06-22 LAB — T-HELPER CELL (CD4) - (RCID CLINIC ONLY)
CD4 % Helper T Cell: 7 % — ABNORMAL LOW (ref 33–65)
CD4 T Cell Abs: 139 /uL — ABNORMAL LOW (ref 400–1790)

## 2019-06-27 LAB — HIV-1 RNA QUANT-NO REFLEX-BLD
HIV 1 RNA Quant: <20 copies/mL
HIV-1 RNA Quant, Log: <1.3 Log copies/mL

## 2019-07-09 ENCOUNTER — Encounter: Payer: Medicaid Other | Admitting: Infectious Diseases

## 2019-07-16 ENCOUNTER — Other Ambulatory Visit: Payer: Self-pay | Admitting: Infectious Diseases

## 2019-07-16 ENCOUNTER — Telehealth: Payer: Self-pay

## 2019-07-16 DIAGNOSIS — B20 Human immunodeficiency virus [HIV] disease: Secondary | ICD-10-CM

## 2019-07-16 NOTE — Telephone Encounter (Signed)
Left vm requesting patient call office to schedule overdue appointment with our office.  [place]

## 2019-07-17 MED FILL — TRIUMEQ 600-50-300 MG TABS: 600-50-300 | 30 days supply | Qty: 30 | Fill #0

## 2019-07-19 ENCOUNTER — Telehealth: Payer: Self-pay

## 2019-07-19 NOTE — Telephone Encounter (Signed)
Received request for medical records from [facility] at [facility] signed by the patient for recent labs and current medication list.  Called patient to verify. Documents faxed as requested once patient verified.  Nicole Silva

## 2019-08-02 ENCOUNTER — Ambulatory Visit: Payer: Medicaid Other | Admitting: Infectious Diseases

## 2019-08-10 MED FILL — TRIUMEQ 600-50-300 MG TABS: 600-50-300 | 30 days supply | Qty: 30 | Fill #1

## 2019-08-23 ENCOUNTER — Telehealth: Payer: Medicaid Other | Admitting: Infectious Diseases

## 2019-08-23 ENCOUNTER — Telehealth: Payer: Self-pay

## 2019-08-23 ENCOUNTER — Ambulatory Visit: Payer: Medicaid Other | Admitting: Infectious Diseases

## 2019-08-23 NOTE — Telephone Encounter (Signed)
Called patient to reschedule missed appointment today. Patient states her cousin passed away last night, and is unable to be seen in person today. Offered patient e visit for 1/5; patient is okay with pushing appointment back. Nicole Silva

## 2019-09-05 MED FILL — TRIUMEQ 600-50-300 MG TABS: 600-50-300 | 30 days supply | Qty: 30 | Fill #2

## 2019-09-18 ENCOUNTER — Other Ambulatory Visit: Payer: Self-pay

## 2019-09-18 ENCOUNTER — Ambulatory Visit (INDEPENDENT_AMBULATORY_CARE_PROVIDER_SITE_OTHER): Payer: Medicaid Other | Admitting: Infectious Diseases

## 2019-09-18 DIAGNOSIS — B20 Human immunodeficiency virus [HIV] disease: Secondary | ICD-10-CM | POA: Diagnosis not present

## 2019-09-18 DIAGNOSIS — D071 Carcinoma in situ of vulva: Secondary | ICD-10-CM

## 2019-09-18 DIAGNOSIS — Z113 Encounter for screening for infections with a predominantly sexual mode of transmission: Secondary | ICD-10-CM

## 2019-09-18 DIAGNOSIS — F1991 Other psychoactive substance use, unspecified, in remission: Secondary | ICD-10-CM

## 2019-09-18 DIAGNOSIS — Z79899 Other long term (current) drug therapy: Secondary | ICD-10-CM

## 2019-09-18 DIAGNOSIS — J439 Emphysema, unspecified: Secondary | ICD-10-CM

## 2019-09-18 DIAGNOSIS — Z87898 Personal history of other specified conditions: Secondary | ICD-10-CM

## 2019-09-18 NOTE — Assessment & Plan Note (Signed)
She needs f/u appt.  Encouraged her to call for appt.

## 2019-09-18 NOTE — Assessment & Plan Note (Signed)
Still smoking, encouraged to quit.  Using inhalers.

## 2019-09-18 NOTE — Assessment & Plan Note (Signed)
Staying clean

## 2019-09-18 NOTE — Progress Notes (Signed)
   Subjective:    Patient ID: [name], female    DOB: 03/13/1971, 49 y.o.   MRN: [id number]  HPI  (evisit)   49yo F with HIV+, treated Hep C (VL - 06-2015), cough variant asthma, obesity. Still smoking, trying to cut back, down to 1/2 ppd (09-18-19).  Has been drug free for 7 yrs.except for marijuana for apetite. Quit ETOH as well.  Ondescovy/tivicay-->triumeq (changed 10-2016).  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  Has been feeling well, taking qhs rx. Has been down as several family members died around holidays. Still coughing. Using her inhalers. Saw MD yesterday and was told that her lungs sounded real good. Cough mostly at night.   HIV 1 RNA Quant (copies/mL)  Date Value  06/22/2019 <20 NOT DETECTED  11/29/2018 16,500 (H)  10/31/2018 2,750 (H)   CD4 T Cell Abs (/uL)  Date Value  06/22/2019 139 (L)  10/31/2018 130 (L)  04/10/2018 150 (L)     Review of Systems  Constitutional: Negative for appetite change, chills, fever and unexpected weight change.  Respiratory: Positive for cough.   Gastrointestinal: Negative for constipation and diarrhea.  Genitourinary: Negative for difficulty urinating.  Psychiatric/Behavioral: Positive for dysphoric mood.  10# down.      Objective:   Physical Exam        Assessment & Plan:

## 2019-09-18 NOTE — Assessment & Plan Note (Signed)
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  Will see her in person in 6 months.  Is living alone.

## 2019-10-08 MED FILL — TRIUMEQ 600-50-300 MG TABS: 600-50-300 | 30 days supply | Qty: 30 | Fill #3

## 2019-10-17 ENCOUNTER — Other Ambulatory Visit: Payer: Self-pay | Admitting: Family Medicine

## 2019-10-17 NOTE — Telephone Encounter (Signed)
appt made for 11/07/19

## 2019-10-31 ENCOUNTER — Other Ambulatory Visit: Payer: Self-pay | Admitting: Infectious Diseases

## 2019-10-31 DIAGNOSIS — B20 Human immunodeficiency virus [HIV] disease: Secondary | ICD-10-CM

## 2019-10-31 MED FILL — TRIUMEQ 600-50-300 MG TABS: 600-50-300 | 30 days supply | Qty: 30 | Fill #0

## 2019-11-07 ENCOUNTER — Other Ambulatory Visit: Payer: Self-pay

## 2019-11-07 ENCOUNTER — Ambulatory Visit: Payer: Medicaid Other | Admitting: Family Medicine

## 2019-11-07 ENCOUNTER — Encounter: Payer: Self-pay | Admitting: Family Medicine

## 2019-11-07 VITALS — BP 129/87 | HR 103 | Temp 97.6°F | Ht 65.5 in | Wt 248.6 lb

## 2019-11-07 DIAGNOSIS — F3181 Bipolar II disorder: Secondary | ICD-10-CM | POA: Diagnosis not present

## 2019-11-07 DIAGNOSIS — F339 Major depressive disorder, recurrent, unspecified: Secondary | ICD-10-CM | POA: Diagnosis not present

## 2019-11-07 DIAGNOSIS — M1611 Unilateral primary osteoarthritis, right hip: Secondary | ICD-10-CM | POA: Diagnosis not present

## 2019-11-07 DIAGNOSIS — J439 Emphysema, unspecified: Secondary | ICD-10-CM

## 2019-11-07 MED ORDER — METHYLPREDNISOLONE ACETATE 80 MG/ML IJ SUSP
80.0000 mg | Freq: Once | INTRAMUSCULAR | Status: AC
  Administered 2019-11-07: 17:00:00 80 mg via INTRAMUSCULAR

## 2019-11-07 MED ORDER — CLONAZEPAM 1 MG PO TABS: 1.0000 mg | ORAL_TABLET | Freq: Three times a day (TID) | ORAL | 0 refills | Status: AC | PRN

## 2019-11-07 MED ORDER — VYVANSE 70 MG PO CAPS: 70.0000 mg | ORAL_CAPSULE | Freq: Two times a day (BID) | ORAL | 0 refills | Status: DC

## 2019-11-07 NOTE — Progress Notes (Signed)
BP 129/87   Pulse (!) 103   Temp 97.6 F (36.4 C) (Oral)   Ht 5' 5.5" (1.664 m)   Wt 248 lb 9.6 oz (112.8 kg)   SpO2 98%   BMI 40.74 kg/m    Subjective:   Patient ID: Nicole Silva, female    DOB: 06-10-1971, 49 y.o.   MRN: [id number]  HPI: Nicole Silva is a 49 y.o. female presenting on 11/07/2019 for Depression (check up of chronic medical conditions)   HPI Patient is coming in for depression anxiety and ADHD and bipolar, she normally sees a psychiatry office for this, she said they are without a doctor right now and needs to be bridged.  [facility] [place] and I spoke by the phone with a Dennis Bast who is a Marine scientist who works there and says that without a doctor right now but do plan on having one soon, within 30 days.  Patient was counseled on COPD smoking cessation and says she is trying, she does have some congestion and a cough that is chronic.  She does use inhalers to help with this.  Relevant past medical, surgical, family and social history reviewed and updated as indicated. Interim medical history since our last visit reviewed. Allergies and medications reviewed and updated.  Review of Systems  Constitutional: Negative for chills and fever.  HENT: Positive for congestion. Negative for ear discharge and ear pain.   Eyes: Negative for redness and visual disturbance.  Respiratory: Positive for cough. Negative for chest tightness and shortness of breath.   Cardiovascular: Negative for chest pain and leg swelling.  Musculoskeletal: Negative for back pain and gait problem.  Skin: Negative for rash.  Neurological: Negative for light-headedness and headaches.  Psychiatric/Behavioral: Positive for decreased concentration, dysphoric mood and sleep disturbance. Negative for agitation, behavioral problems, self-injury and suicidal ideas. The patient is nervous/anxious.   All other systems reviewed and are negative.   Per  HPI unless specifically indicated above   Allergies as of 11/07/2019      Reactions   Latex Hives   Peach [prunus Persica] Hives   Penicillins Hives   Has patient had a PCN reaction causing immediate rash, facial/tongue/throat swelling, SOB or lightheadedness with hypotension: Yes Has patient had a PCN reaction causing severe rash involving mucus membranes or skin necrosis: No Has patient had a PCN reaction that required hospitalization: No Has patient had a PCN reaction occurring within the last 10 years: No If all of the above answers are "NO", then may proceed with Cephalosporin use.      Medication List       Accurate as of November 07, 2019  4:44 PM. If you have any questions, ask your nurse or doctor.        albuterol 108 (90 Base) MCG/ACT inhaler Commonly known as: VENTOLIN HFA Inhale 2 puffs into the lungs every 6 (six) hours as needed for wheezing or shortness of breath.   albuterol (2.5 MG/3ML) 0.083% nebulizer solution Commonly known as: PROVENTIL INHALE CONTENTS OF 1 VIAL IN NEBULIZER EVERY 6 HOURS AS NEEDED FOR WHEEZING OR SHORTNESS OF BREATH   clonazePAM 1 MG tablet Commonly known as: KLONOPIN Take 1 tablet (1 mg total) by mouth 3 (three) times daily as needed for anxiety.   fluticasone 50 MCG/ACT nasal spray Commonly known as: FLONASE Place 1 spray into both nostrils 2 (two) times daily as needed for allergies or rhinitis.   Fluticasone-Salmeterol 100-50 MCG/DOSE Aepb Commonly known  as: ADVAIR Inhale 1 puff into the lungs 2 (two) times daily.   lisdexamfetamine 70 MG capsule Commonly known as: Vyvanse Take 1 capsule (70 mg total) by mouth 2 (two) times daily.   Vyvanse 70 MG capsule Generic drug: lisdexamfetamine Take 1 capsule (70 mg total) by mouth 2 (two) times daily. This is a one-time order because her psychiatrist [place]   QUEtiapine 400 MG tablet Commonly known as: SEROquel Take 1 tablet (400 mg total) by mouth 2 (two) times daily.    trazodone 300 MG tablet Commonly known as: DESYREL Take 1 tablet (300 mg total) by mouth at bedtime as needed for sleep.   Triumeq 600-50-300 MG tablet Generic drug: abacavir-dolutegravir-lamiVUDine TAKE 1 TABLET BY MOUTH ONCE DAILY        Objective:   BP 129/87   Pulse (!) 103   Temp 97.6 F (36.4 C) (Oral)   Ht 5' 5.5" (1.664 m)   Wt 248 lb 9.6 oz (112.8 kg)   SpO2 98%   BMI 40.74 kg/m   Wt Readings from Last 3 Encounters:  11/07/19 248 lb 9.6 oz (112.8 kg)  11/29/18 248 lb (112.5 kg)  10/25/18 263 lb (119.3 kg)    Physical Exam Vitals and nursing note reviewed.  Constitutional:      General: She is not in acute distress.    Appearance: She is well-developed. She is not diaphoretic.  Eyes:     Conjunctiva/sclera: Conjunctivae normal.  Cardiovascular:     Rate and Rhythm: Normal rate and regular rhythm.     Heart sounds: Normal heart sounds. No murmur.  Pulmonary:     Effort: Pulmonary effort is normal. No respiratory distress.     Breath sounds: Normal breath sounds. No wheezing.  Musculoskeletal:        General: No tenderness. Normal range of motion.  Skin:    General: Skin is warm and dry.     Findings: No rash.  Neurological:     Mental Status: She is alert and oriented to person, place, and time.     Coordination: Coordination normal.  Psychiatric:        Behavior: Behavior normal.       Assessment & Plan:   Problem List Items Addressed This Visit      Respiratory   COPD (chronic obstructive pulmonary disease) with emphysema (HCC)   Relevant Medications   methylPREDNISolone acetate (DEPO-MEDROL) injection 80 mg (Start on 11/07/2019  4:45 PM)     Other   Depression, recurrent (HCC) - Primary   Relevant Medications   clonazePAM (KLONOPIN) 1 MG tablet   VYVANSE 70 MG capsule   Bipolar 2 disorder, major depressive episode (HCC)   Relevant Medications   clonazePAM (KLONOPIN) 1 MG tablet   VYVANSE 70 MG capsule    Other Visit Diagnoses     Primary osteoarthritis of right hip       Relevant Medications   methylPREDNISolone acetate (DEPO-MEDROL) injection 80 mg (Start on 11/07/2019  4:45 PM)      Will give 1 month refill of Vyvanse and clonazepam, continue current medication  Patient continues to have arthritis of her hip and wants a Depo-Medrol shot again that did seem to help before, will go ahead and do that.  COPD seems to be stable although she is still smoking so is not doing great but it is stable. Follow up plan: Return in about 6 months (around 05/06/2020), or if symptoms worsen or fail to improve, for COPD  recheck.  Counseling provided for all of the vaccine components No orders of the defined types were placed in this encounter.   Caryl Pina, MD [facility] 11/07/2019, 4:44 PM

## 2019-11-20 ENCOUNTER — Other Ambulatory Visit: Payer: Medicaid Other | Admitting: Obstetrics and Gynecology

## 2019-11-30 MED FILL — TRIUMEQ 600-50-300 MG TABS: 600-50-300 | 30 days supply | Qty: 30 | Fill #1

## 2019-12-27 MED FILL — TRIUMEQ 600-50-300 MG TABS: 600-50-300 | 30 days supply | Qty: 30 | Fill #2

## 2020-01-23 ENCOUNTER — Telehealth: Payer: Self-pay | Admitting: Family Medicine

## 2020-01-23 NOTE — Telephone Encounter (Signed)
Advised patient that is office policy that any controlled substance to be filled in an office visit.  No available appointments until 02/21/20.  Patient states she has previously been receiving her prescriptions from [facility] in [place] and needs her medications now.  Once again advised patient that she will need to be seen in office.  Offered appointment for 6/10 with Dr. Warrick Parisian but patient declined.

## 2020-01-23 NOTE — Telephone Encounter (Signed)
Understands is not necessarily her fault but she needs to find a more stable psychiatrist, we cannot keep prescribing the levels at doses that she is on.  She needs to see about more stability or find someplace that can give her more stability.  If we did do a bridge at all like we have occasionally in the past he would have to be in a visit.

## 2020-02-20 ENCOUNTER — Other Ambulatory Visit: Payer: Self-pay | Admitting: Infectious Diseases

## 2020-02-20 DIAGNOSIS — B20 Human immunodeficiency virus [HIV] disease: Secondary | ICD-10-CM

## 2020-02-25 ENCOUNTER — Ambulatory Visit: Payer: Medicaid Other | Admitting: Family Medicine

## 2020-02-25 MED FILL — TRIUMEQ 600-50-300 MG TABS: 600-50-300 | 30 days supply | Qty: 30 | Fill #0

## 2020-03-10 ENCOUNTER — Telehealth: Payer: Self-pay | Admitting: Family Medicine

## 2020-03-10 NOTE — Telephone Encounter (Signed)
Appointment scheduled.

## 2020-03-11 ENCOUNTER — Encounter: Payer: Self-pay | Admitting: Family

## 2020-03-11 ENCOUNTER — Ambulatory Visit (INDEPENDENT_AMBULATORY_CARE_PROVIDER_SITE_OTHER): Payer: Medicaid Other | Admitting: Family

## 2020-03-11 DIAGNOSIS — J441 Chronic obstructive pulmonary disease with (acute) exacerbation: Secondary | ICD-10-CM | POA: Diagnosis not present

## 2020-03-11 DIAGNOSIS — B20 Human immunodeficiency virus [HIV] disease: Secondary | ICD-10-CM | POA: Diagnosis not present

## 2020-03-11 DIAGNOSIS — F17200 Nicotine dependence, unspecified, uncomplicated: Secondary | ICD-10-CM

## 2020-03-11 DIAGNOSIS — Z09 Encounter for follow-up examination after completed treatment for conditions other than malignant neoplasm: Secondary | ICD-10-CM | POA: Diagnosis not present

## 2020-03-11 NOTE — Progress Notes (Signed)
   Virtual Visit via telephone Note Due to COVID-19 pandemic this visit was conducted virtually. This visit type was conducted due to national recommendations for restrictions regarding the COVID-19 Pandemic (e.g. social distancing, sheltering in place) in an effort to limit this patient's exposure and mitigate transmission in our community. All issues noted in this document were discussed and addressed.  A physical exam was not performed with this format.  I connected with Nicole Silva on 03/11/20 at 8:55 AM by telephone and verified that I am speaking with the correct person using two identifiers. Nicole Silva is currently located at home and no one is currently with her during visit. The provider, Evelina Dun, FNP is located in their office at time of visit.  I discussed the limitations, risks, security and privacy concerns of performing an evaluation and management service by telephone and the availability of in person appointments. I also discussed with the patient that there may be a patient responsible charge related to this service. The patient expressed understanding and agreed to proceed.   History and Present Illness:   HPI Pt calls the office today for ED follow up. She went to the ED with SOB and cough on 03/08/20. She was diagnosed with bronchitis. She has hx of COPD, smoker, and HIV. She had a negative chest x-ray. She was given azithromycin IV, rocephin IV, steroids and breathing treatment. She was discharged on doxycycline, prednisone, and albuterol inhaler.   She reports she is feeling a little better, but continues to have intermittent SOB and productive cough with yellow sputum. Denies any fever.    Review of Systems  Constitutional: Positive for malaise/fatigue.  Respiratory: Positive for cough, sputum production and shortness of breath.   All other systems reviewed and are negative.    Observations/Objective: No SOB or distress, hoarse voice  and coarse cough noted   Assessment and Plan: 1. COPD exacerbation ([place])  2. Hospital discharge follow-up  3. Smoker  4. Human immunodeficiency virus (HIV) disease [facility])  Hospital notes reviewed Continue medications  Keep follow up with PCP Call if symptoms worsen or do not improve Smoking cessation discussed      I discussed the assessment and treatment plan with the patient. The patient was provided an opportunity to ask questions and all were answered. The patient agreed with the plan and demonstrated an understanding of the instructions.   The patient was advised to call back or seek an in-person evaluation if the symptoms worsen or if the condition fails to improve as anticipated.  The above assessment and management plan was discussed with the patient. The patient verbalized understanding of and has agreed to the management plan. Patient is aware to call the clinic if symptoms persist or worsen. Patient is aware when to return to the clinic for a follow-up visit. Patient educated on when it is appropriate to go to the emergency department.   Time call ended:  9:08 AM   I provided 13 minutes of non-face-to-face time during this encounter.    Evelina Dun, FNP

## 2020-03-13 ENCOUNTER — Other Ambulatory Visit: Payer: Self-pay | Admitting: Family Medicine

## 2020-03-13 DIAGNOSIS — J439 Emphysema, unspecified: Secondary | ICD-10-CM

## 2020-03-20 ENCOUNTER — Other Ambulatory Visit: Payer: Self-pay

## 2020-03-20 ENCOUNTER — Telehealth: Payer: Self-pay | Admitting: Family Medicine

## 2020-03-20 DIAGNOSIS — N39498 Other specified urinary incontinence: Secondary | ICD-10-CM

## 2020-03-20 NOTE — Telephone Encounter (Signed)
Supplies ordered and faxed to [facility].

## 2020-03-20 NOTE — Telephone Encounter (Signed)
  Prescription Request  03/20/2020  What is the name of the medication or equipment? Pull-ups, gloves, under pads  Have you contacted your pharmacy to request a refill? (if applicable) Yes  Which pharmacy would you like this sent to? [facility]   Patient notified that their request is being sent to the clinical staff for review and that they should receive a response within 2 business days.   [facility].  Please call pt.  Phone #[phone] & Fax#[phone] [facility]

## 2020-03-20 NOTE — Telephone Encounter (Signed)
Ok to write order for these?  I don't see anything in her chart about incontinence?

## 2020-03-20 NOTE — Telephone Encounter (Signed)
Yes okay to go ahead and write for these, you can put it under the diagnosis of months pacified incontinence and we will discuss it further at the next visit. Thank you

## 2020-03-31 MED FILL — TRIUMEQ 600-50-300 MG TABS: 600-50-300 | 30 days supply | Qty: 30 | Fill #1

## 2020-04-02 ENCOUNTER — Ambulatory Visit (INDEPENDENT_AMBULATORY_CARE_PROVIDER_SITE_OTHER): Payer: Medicaid Other | Admitting: Family Medicine

## 2020-04-02 ENCOUNTER — Other Ambulatory Visit: Payer: Self-pay

## 2020-04-02 ENCOUNTER — Encounter: Payer: Self-pay | Admitting: Family Medicine

## 2020-04-02 VITALS — BP 121/86 | HR 94 | Temp 98.1°F | Ht 65.5 in | Wt 244.0 lb

## 2020-04-02 DIAGNOSIS — B070 Plantar wart: Secondary | ICD-10-CM | POA: Diagnosis not present

## 2020-04-02 DIAGNOSIS — F17200 Nicotine dependence, unspecified, uncomplicated: Secondary | ICD-10-CM | POA: Diagnosis not present

## 2020-04-02 MED ORDER — NICOTINE 21 MG/24HR TD PT24: 21.0000 mg | MEDICATED_PATCH | Freq: Every day | TRANSDERMAL | 2 refills | Status: DC

## 2020-04-02 NOTE — Progress Notes (Signed)
   BP 121/86   Pulse 94   Temp 98.1 F (36.7 C)   Ht 5' 5.5" (1.664 m)   Wt 244 lb (110.7 kg)   SpO2 92%   BMI 39.99 kg/m    Subjective:   Patient ID: Nicole Silva, female    DOB: 08-13-1971, 49 y.o.   MRN: [id number]  HPI: Nicole Silva is a 49 y.o. female presenting on 04/02/2020 for Genital Warts (right great toe)   HPI Patient is coming in for repeat wart treatment, she has a plantar wart on her right great toe that she said was completely gone before but has come back.  Patient also wants to discuss smoking cessation.  Relevant past medical, surgical, family and social history reviewed and updated as indicated. Interim medical history since our last visit reviewed. Allergies and medications reviewed and updated.  Review of Systems  Constitutional: Negative for fever.  Respiratory: Negative for shortness of breath and wheezing.   Skin: Positive for rash. Negative for color change.  Neurological: Negative for dizziness.    Per HPI unless specifically indicated above      Objective:   BP 121/86   Pulse 94   Temp 98.1 F (36.7 C)   Ht 5' 5.5" (1.664 m)   Wt 244 lb (110.7 kg)   SpO2 92%   BMI 39.99 kg/m   Wt Readings from Last 3 Encounters:  04/02/20 244 lb (110.7 kg)  11/07/19 248 lb 9.6 oz (112.8 kg)  11/29/18 248 lb (112.5 kg)    Physical Exam Vitals and nursing note reviewed.  Constitutional:      General: She is not in acute distress.    Appearance: She is well-developed. She is not diaphoretic.  Eyes:     Conjunctiva/sclera: Conjunctivae normal.  Skin:    General: Skin is warm and dry.     Findings: Lesion (0.2 cm plantar wart on right great toe and a very pinpoint small area near the base of her right great toe on the plantar surface as well.) present. No rash.  Neurological:     Mental Status: She is alert and oriented to person, place, and time.     Coordination: Coordination normal.  Psychiatric:        Behavior:  Behavior normal.     Wart cryo: Did 3-10-second bursts of cryotherapy using liquid nitrogen on 2 warts on the plantar surface of her right great toe, patient tolerated well  Assessment & Plan:   Problem List Items Addressed This Visit      Other   Smoker    Other Visit Diagnoses    Plantar wart    -  Primary       Follow up plan: Return in about 3 weeks (around 04/23/2020), or if symptoms worsen or fail to improve, for Wart treatment.  Counseling provided for all of the vaccine components No orders of the defined types were placed in this encounter.   Caryl Pina, MD [facility] 04/02/2020, 1:23 PM

## 2020-04-24 ENCOUNTER — Ambulatory Visit: Payer: Medicaid Other | Admitting: Family Medicine

## 2020-04-24 ENCOUNTER — Other Ambulatory Visit: Payer: Self-pay

## 2020-04-24 ENCOUNTER — Encounter: Payer: Self-pay | Admitting: Family Medicine

## 2020-04-24 VITALS — BP 122/81 | HR 93 | Temp 98.4°F | Ht 65.5 in | Wt 242.0 lb

## 2020-04-24 DIAGNOSIS — M1612 Unilateral primary osteoarthritis, left hip: Secondary | ICD-10-CM | POA: Diagnosis not present

## 2020-04-24 DIAGNOSIS — N39498 Other specified urinary incontinence: Secondary | ICD-10-CM | POA: Diagnosis not present

## 2020-04-24 DIAGNOSIS — B070 Plantar wart: Secondary | ICD-10-CM | POA: Diagnosis not present

## 2020-04-24 MED ORDER — METHYLPREDNISOLONE ACETATE 80 MG/ML IJ SUSP
80.0000 mg | Freq: Once | INTRAMUSCULAR | Status: AC
  Administered 2020-04-24: 80 mg via INTRAMUSCULAR

## 2020-04-24 NOTE — Progress Notes (Signed)
BP 122/81   Pulse 93   Temp 98.4 F (36.9 C)   Ht 5' 5.5" (1.664 m)   Wt 242 lb (109.8 kg)   SpO2 96%   BMI 39.66 kg/m    Subjective:   Patient ID: Nicole Silva, female    DOB: 1971-06-22, 49 y.o.   MRN: [id number]  HPI: Nicole Silva is a 49 y.o. female presenting on 04/24/2020 for Plantar Warts   HPI Patient has urinary incontinence is coming in for urinary incontinence supplies.  She just needs a refill for this.  She has incontinence both day and night  Patient has left hip osteoarthritis that started to bother her again and wonders if she can get another steroid injection that seemed to help, she would like to try it again.  Patient denies any numbness or weakness or radiation of the pain, it is mainly in the left hip/groin.  Patient has a plantar wart on her right great toe that she needs to come in for retreatment.  This is the third time of retrieving this time, it seems to be getting him better slight wheeze every time, she also had a small wart that is now gone.  Relevant past medical, surgical, family and social history reviewed and updated as indicated. Interim medical history since our last visit reviewed. Allergies and medications reviewed and updated.  Review of Systems  Constitutional: Negative for chills and fever.  Eyes: Negative for visual disturbance.  Respiratory: Negative for chest tightness and shortness of breath.   Cardiovascular: Negative for chest pain and leg swelling.  Musculoskeletal: Positive for arthralgias. Negative for back pain and gait problem.  Skin: Negative for color change and rash.  Neurological: Negative for light-headedness and headaches.  Psychiatric/Behavioral: Negative for agitation and behavioral problems.  All other systems reviewed and are negative.   Per HPI unless specifically indicated above   Allergies as of 04/24/2020      Reactions   Latex Hives   Peach [prunus Persica] Hives   Penicillins  Hives   Has patient had a PCN reaction causing immediate rash, facial/tongue/throat swelling, SOB or lightheadedness with hypotension: Yes Has patient had a PCN reaction causing severe rash involving mucus membranes or skin necrosis: No Has patient had a PCN reaction that required hospitalization: No Has patient had a PCN reaction occurring within the last 10 years: No If all of the above answers are "NO", then may proceed with Cephalosporin use.      Medication List       Accurate as of April 24, 2020 10:51 AM. If you have any questions, ask your nurse or doctor.        albuterol 108 (90 Base) MCG/ACT inhaler Commonly known as: VENTOLIN HFA Inhale 2 puffs into the lungs every 6 (six) hours as needed for wheezing or shortness of breath.   albuterol (2.5 MG/3ML) 0.083% nebulizer solution Commonly known as: PROVENTIL INHALE CONTENTS OF 1 VIAL IN NEBULIZER EVERY 6 HOURS AS NEEDED FOR WHEEZING OR SHORTNESS OF BREATH   clonazePAM 1 MG tablet Commonly known as: KLONOPIN Take 1 tablet (1 mg total) by mouth 3 (three) times daily as needed for anxiety.   fluticasone 50 MCG/ACT nasal spray Commonly known as: FLONASE Place 1 spray into both nostrils 2 (two) times daily as needed for allergies or rhinitis.   Fluticasone-Salmeterol 100-50 MCG/DOSE Aepb Commonly known as: ADVAIR Inhale 1 puff into the lungs 2 (two) times daily.   lisdexamfetamine 70 MG capsule Commonly known  as: Vyvanse Take 1 capsule (70 mg total) by mouth 2 (two) times daily.   Vyvanse 70 MG capsule Generic drug: lisdexamfetamine Take 1 capsule (70 mg total) by mouth 2 (two) times daily. This is a one-time order because her psychiatrist [place]   nicotine 21 mg/24hr patch Commonly known as: NICODERM CQ - dosed in mg/24 hours Place 1 patch (21 mg total) onto the skin daily.   QUEtiapine 400 MG tablet Commonly known as: SEROquel Take 1 tablet (400 mg total) by mouth 2 (two) times daily.   Triumeq  600-50-300 MG tablet Generic drug: abacavir-dolutegravir-lamiVUDine TAKE 1 TABLET BY MOUTH ONCE DAILY            Durable Medical Equipment  (From admission, onward)         Start     Ordered   04/24/20 0000  For home use only DME Other see comment       Comments: Diagnosis urinary incontinence  Need extra-large adult diapers and gloves large and bed pads, enough for 5/day  Sent to [facility] medical  Question:  Length of Need  Answer:  Lifetime   04/24/20 1050           Objective:   BP 122/81   Pulse 93   Temp 98.4 F (36.9 C)   Ht 5' 5.5" (1.664 m)   Wt 242 lb (109.8 kg)   SpO2 96%   BMI 39.66 kg/m   Wt Readings from Last 3 Encounters:  04/24/20 242 lb (109.8 kg)  04/02/20 244 lb (110.7 kg)  11/07/19 248 lb 9.6 oz (112.8 kg)    Physical Exam Vitals and nursing note reviewed.  Constitutional:      General: She is not in acute distress.    Appearance: She is well-developed. She is not diaphoretic.  Eyes:     Conjunctiva/sclera: Conjunctivae normal.  Skin:    General: Skin is warm and dry.     Findings: Lesion (0.2 cm plantar wart on right great toe on the base plantar side) present. No rash.  Neurological:     Mental Status: She is alert and oriented to person, place, and time.     Coordination: Coordination normal.  Psychiatric:        Behavior: Behavior normal.       Assessment & Plan:   Problem List Items Addressed This Visit    None    Visit Diagnoses    Plantar wart    -  Primary   Osteoarthritis of left hip, unspecified osteoarthritis type       Relevant Medications   methylPREDNISolone acetate (DEPO-MEDROL) injection 80 mg (Start on 04/24/2020 11:00 AM)   Other specified urinary incontinence       Relevant Orders   For home use only DME Other see comment      Wart cryotherapy: Obtained verbal consent from patient.  Applied 3-10-second bursts of liquid nitrogen using cryogun to wart on right great toe, patient tolerated well. Follow up  plan: Return in about 3 weeks (around 05/15/2020), or if symptoms worsen or fail to improve, for Plantar wart retreatment.  Counseling provided for all of the vaccine components Orders Placed This Encounter  Procedures  . For home use only DME Other see comment    Caryl Pina, MD [facility] 04/24/2020, 10:51 AM

## 2020-04-25 ENCOUNTER — Other Ambulatory Visit: Payer: Self-pay | Admitting: Infectious Diseases

## 2020-04-25 DIAGNOSIS — B20 Human immunodeficiency virus [HIV] disease: Secondary | ICD-10-CM

## 2020-04-28 MED FILL — TRIUMEQ 600-50-300 MG TABS: 600-50-300 | 30 days supply | Qty: 30 | Fill #0
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
## 2020-05-07 ENCOUNTER — Ambulatory Visit (INDEPENDENT_AMBULATORY_CARE_PROVIDER_SITE_OTHER): Payer: Medicaid Other | Admitting: Obstetrics and Gynecology

## 2020-05-07 ENCOUNTER — Encounter: Payer: Self-pay | Admitting: Obstetrics and Gynecology

## 2020-05-07 VITALS — BP 138/85 | HR 84 | Ht 65.5 in | Wt 242.4 lb

## 2020-05-07 DIAGNOSIS — N764 Abscess of vulva: Secondary | ICD-10-CM | POA: Diagnosis not present

## 2020-05-07 MED ORDER — SULFAMETHOXAZOLE-TRIMETHOPRIM 400-80 MG PO TABS: 1.0000 | ORAL_TABLET | Freq: Two times a day (BID) | ORAL | 0 refills | Status: DC

## 2020-05-15 ENCOUNTER — Telehealth: Payer: Self-pay | Admitting: Family Medicine

## 2020-05-15 ENCOUNTER — Encounter: Payer: Self-pay | Admitting: Family Medicine

## 2020-05-15 ENCOUNTER — Ambulatory Visit: Payer: Medicaid Other | Admitting: Family Medicine

## 2020-05-15 NOTE — Telephone Encounter (Signed)
Melissa called from [facility] to see if we received the request she faxed to [place] for pt to get incontinence supplies. Was faxed to [place] on 05/11/20.

## 2020-05-15 NOTE — Telephone Encounter (Signed)
Message left that we have received and will be faxed back today.

## 2020-06-04 MED FILL — TRIUMEQ 600-50-300 MG TABS: 600-50-300 | 30 days supply | Qty: 30 | Fill #1

## 2020-07-07 ENCOUNTER — Telehealth: Payer: Self-pay

## 2020-07-07 NOTE — Telephone Encounter (Signed)
[facility] Patient Advocate Encounter  [facility] and I have been unsuccsessful in reaching patient to be able to refill medication.    We have tried multiple times without a response.  Raschelle Wisenbaker, CPhT Specialty Pharmacy Patient Advocate [facility] Phone: [phone] Fax:  [phone]  

## 2020-10-06 ENCOUNTER — Ambulatory Visit: Payer: Medicaid Other | Admitting: Family Medicine

## 2020-10-06 ENCOUNTER — Encounter: Payer: Self-pay | Admitting: Family Medicine

## 2020-10-06 ENCOUNTER — Other Ambulatory Visit: Payer: Self-pay

## 2020-10-06 ENCOUNTER — Telehealth: Payer: Self-pay

## 2020-10-06 VITALS — BP 120/78 | Ht 65.5 in | Wt 234.0 lb

## 2020-10-06 DIAGNOSIS — L03115 Cellulitis of right lower limb: Secondary | ICD-10-CM

## 2020-10-06 MED ORDER — SULFAMETHOXAZOLE-TRIMETHOPRIM 800-160 MG PO TABS: 1.0000 | ORAL_TABLET | Freq: Two times a day (BID) | ORAL | 0 refills | Status: DC

## 2020-10-06 MED ORDER — FLUCONAZOLE 150 MG PO TABS: 150.0000 mg | ORAL_TABLET | Freq: Once | ORAL | 1 refills | Status: AC

## 2020-10-06 NOTE — Progress Notes (Signed)
BP 120/78   Ht 5' 5.5" (1.664 m)   Wt 234 lb (106.1 kg)   SpO2 97%   BMI 38.35 kg/m    Subjective:   Patient ID: Nicole Silva, female    DOB: 04-03-1971, 50 y.o.   MRN: [id number]  HPI: Nicole Silva is a 50 y.o. female presenting on 10/06/2020 for Abscess (LLE. Red and tender, some drainage)   HPI Patient is coming in today complaining of an abscess on her left lower extremity that has been bothering her for at least a week.  Is been tender and she has had an abscess and some drainage.  She has been using triple antibiotic cream and compressing it to express the drainage and it has been draining for her.  She denies any fevers or chills or body aches.  She does also get yeast rash under her breast and in her groin and she says it gets worse whenever she takes an antibiotic.  Relevant past medical, surgical, family and social history reviewed and updated as indicated. Interim medical history since our last visit reviewed. Allergies and medications reviewed and updated.  Review of Systems  Constitutional: Negative for chills and fever.  Eyes: Negative for visual disturbance.  Respiratory: Negative for chest tightness and shortness of breath.   Cardiovascular: Negative for chest pain and leg swelling.  Musculoskeletal: Negative for back pain and gait problem.  Skin: Positive for color change, rash and wound.  Neurological: Negative for light-headedness and headaches.  Psychiatric/Behavioral: Negative for agitation and behavioral problems.  All other systems reviewed and are negative.   Per HPI unless specifically indicated above   Allergies as of 10/06/2020      Reactions   Latex Hives   Peach [prunus Persica] Hives   Penicillins Hives   Has patient had a PCN reaction causing immediate rash, facial/tongue/throat swelling, SOB or lightheadedness with hypotension: Yes Has patient had a PCN reaction causing severe rash involving mucus membranes or skin  necrosis: No Has patient had a PCN reaction that required hospitalization: No Has patient had a PCN reaction occurring within the last 10 years: No If all of the above answers are "NO", then may proceed with Cephalosporin use.      Medication List       Accurate as of October 06, 2020  3:17 PM. If you have any questions, ask your nurse or doctor.        albuterol 108 (90 Base) MCG/ACT inhaler Commonly known as: VENTOLIN HFA Inhale 2 puffs into the lungs every 6 (six) hours as needed for wheezing or shortness of breath.   albuterol (2.5 MG/3ML) 0.083% nebulizer solution Commonly known as: PROVENTIL INHALE CONTENTS OF 1 VIAL IN NEBULIZER EVERY 6 HOURS AS NEEDED FOR WHEEZING OR SHORTNESS OF BREATH   clonazePAM 1 MG tablet Commonly known as: KLONOPIN Take 1 tablet (1 mg total) by mouth 3 (three) times daily as needed for anxiety.   doxycycline 100 MG tablet Commonly known as: VIBRA-TABS Take 100 mg by mouth 2 (two) times daily.   fluticasone 50 MCG/ACT nasal spray Commonly known as: FLONASE Place 1 spray into both nostrils 2 (two) times daily as needed for allergies or rhinitis.   Fluticasone-Salmeterol 100-50 MCG/DOSE Aepb Commonly known as: ADVAIR Inhale 1 puff into the lungs 2 (two) times daily.   lisdexamfetamine 70 MG capsule Commonly known as: Vyvanse Take 1 capsule (70 mg total) by mouth 2 (two) times daily.   Vyvanse 70 MG capsule Generic drug:  lisdexamfetamine Take 1 capsule (70 mg total) by mouth 2 (two) times daily. This is a one-time order because her psychiatrist is [place]   nicotine 21 mg/24hr patch Commonly known as: NICODERM CQ - dosed in mg/24 hours Place 1 patch (21 mg total) onto the skin daily.   QUEtiapine 400 MG tablet Commonly known as: SEROquel Take 1 tablet (400 mg total) by mouth 2 (two) times daily.   sulfamethoxazole-trimethoprim 400-80 MG tablet Commonly known as: Bactrim Take 1 tablet by mouth 2 (two) times daily. Twice daily  for uti   Triumeq 600-50-300 MG tablet Generic drug: abacavir-dolutegravir-lamiVUDine TAKE 1 TABLET BY MOUTH ONCE DAILY        Objective:   BP 120/78   Ht 5' 5.5" (1.664 m)   Wt 234 lb (106.1 kg)   SpO2 97%   BMI 38.35 kg/m   Wt Readings from Last 3 Encounters:  10/06/20 234 lb (106.1 kg)  05/07/20 242 lb 6.4 oz (110 kg)  04/24/20 242 lb (109.8 kg)    Physical Exam Vitals and nursing note reviewed.  Constitutional:      General: She is not in acute distress.    Appearance: She is well-developed and well-nourished. She is not diaphoretic.  Eyes:     Extraocular Movements: EOM normal.     Conjunctiva/sclera: Conjunctivae normal.  Cardiovascular:     Pulses: Intact distal pulses.  Musculoskeletal:        General: No edema.  Skin:    General: Skin is warm and dry.     Findings: Erythema and wound present. No rash.  Neurological:     Mental Status: She is alert and oriented to person, place, and time.     Coordination: Coordination normal.  Psychiatric:        Mood and Affect: Mood and affect normal.        Behavior: Behavior normal.       Assessment & Plan:   Problem List Items Addressed This Visit   None   Visit Diagnoses    Cellulitis of right lower extremity    -  Primary   Relevant Medications   sulfamethoxazole-trimethoprim (BACTRIM DS) 800-160 MG tablet   fluconazole (DIFLUCAN) 150 MG tablet       Follow up plan: Return in about 4 weeks (around 11/03/2020), or if symptoms worsen or fail to improve, for Diabetes follow-up and physical..  Counseling provided for all of the vaccine components No orders of the defined types were placed in this encounter.   Caryl Pina, MD [facility] 10/06/2020, 3:17 PM

## 2020-10-06 NOTE — Telephone Encounter (Signed)
CORRECTION TO PREVIOUS MESSAGE: Pt now uses [facility] as her incontinence supplier and phone # is [phone]. Pt says she needs bed pads and thick panty liners.

## 2020-10-06 NOTE — Telephone Encounter (Signed)
FYI: Pt forgot to let PCP know that her new incontinence supplier is through [facility] and phone # is  [phone].

## 2020-10-06 NOTE — Telephone Encounter (Signed)
NOTED

## 2020-10-07 ENCOUNTER — Other Ambulatory Visit: Payer: Self-pay

## 2020-10-07 DIAGNOSIS — R32 Unspecified urinary incontinence: Secondary | ICD-10-CM

## 2020-10-08 MED FILL — TRIUMEQ 600-50-300 MG TABS: 600-50-300 | 30 days supply | Qty: 30 | Fill #2

## 2020-10-10 NOTE — Telephone Encounter (Signed)
NA / VM full, needing to know incontinence supplier, [facility] is the product name

## 2020-10-17 ENCOUNTER — Encounter: Payer: Self-pay | Admitting: Infectious Diseases

## 2020-10-17 ENCOUNTER — Other Ambulatory Visit: Payer: Self-pay

## 2020-10-17 ENCOUNTER — Ambulatory Visit (INDEPENDENT_AMBULATORY_CARE_PROVIDER_SITE_OTHER): Payer: Medicaid Other | Admitting: Infectious Diseases

## 2020-10-17 VITALS — BP 107/80 | HR 79 | Temp 97.4°F | Ht 65.0 in | Wt 228.0 lb

## 2020-10-17 DIAGNOSIS — L732 Hidradenitis suppurativa: Secondary | ICD-10-CM | POA: Insufficient documentation

## 2020-10-17 DIAGNOSIS — Z79899 Other long term (current) drug therapy: Secondary | ICD-10-CM

## 2020-10-17 DIAGNOSIS — B20 Human immunodeficiency virus [HIV] disease: Secondary | ICD-10-CM

## 2020-10-17 DIAGNOSIS — F1991 Other psychoactive substance use, unspecified, in remission: Secondary | ICD-10-CM

## 2020-10-17 DIAGNOSIS — F3181 Bipolar II disorder: Secondary | ICD-10-CM

## 2020-10-17 DIAGNOSIS — Z87898 Personal history of other specified conditions: Secondary | ICD-10-CM

## 2020-10-17 DIAGNOSIS — Z113 Encounter for screening for infections with a predominantly sexual mode of transmission: Secondary | ICD-10-CM | POA: Diagnosis not present

## 2020-10-17 DIAGNOSIS — J439 Emphysema, unspecified: Secondary | ICD-10-CM

## 2020-10-17 DIAGNOSIS — D071 Carcinoma in situ of vulva: Secondary | ICD-10-CM

## 2020-10-17 NOTE — Assessment & Plan Note (Signed)
Has psychiatric provider following her.

## 2020-10-17 NOTE — Assessment & Plan Note (Signed)
She is currently on bactrim Encouraged her to not shave her axilla.  Will watch for improvement.

## 2020-10-17 NOTE — Addendum Note (Signed)
Addended by: Caffie Pinto on: 10/17/2020 12:15 PM   Modules accepted: Orders

## 2020-10-17 NOTE — Assessment & Plan Note (Signed)
States she has lost 70# Encouraged her

## 2020-10-17 NOTE — Progress Notes (Signed)
   Subjective:    Patient ID: Nicole Silva, female  DOB: 20-Apr-1971, 50 y.o.        MRN: [id number]   HPI  50yo F with HIV+, treated Hep C (VL - 06-2015), cough variant asthma, obesity. Still smoking, trying to cut back, down to 1/2 ppd(09-18-19).  Has been drug free for39yrs.except for marijuana for apetite. Quit ETOH as well.  Ondescovy/tivicay-->triumeq (changed 10-2016).  She also has a hx of VIN III and had partial vulvectomy 07-2017 with high grade areas extending to margins.She had GYN f/u 12-2017 and got depo injection.f/u 1 year. Was seen by GYN this summer for vulvar hidradenitis.  Today c/o of "bumps popping up in her R axilla. " Has had for 3.5 weeks. Hs used triple anbx ouint. Also on sulfa anbx and has improved.  No issues with her ART.  Smoking 1/3 ppd. Asthma has been baseline.   HIV 1 RNA Quant (copies/mL)  Date Value  06/22/2019 <20 NOT DETECTED  11/29/2018 16,500 (H)  10/31/2018 2,750 (H)   CD4 T Cell Abs (/uL)  Date Value  06/22/2019 139 (L)  10/31/2018 130 (L)  04/10/2018 150 (L)     Health Maintenance  Topic Date Due  . PAP SMEAR-Modifier  03/31/2018  . INFLUENZA VACCINE  04/13/2020  . MAMMOGRAM  08/03/2020  . COLONOSCOPY (Pts 45-36yrs Insurance coverage will need to be confirmed)  11/06/2020 (Originally 04/07/2014)  . COVID-19 Vaccine (4 - Booster for Moderna series) 03/23/2021  . TETANUS/TDAP  03/28/2027  . Hepatitis C Screening  Completed  . HIV Screening  Completed      Review of Systems  Constitutional: Negative for chills and fever.  Respiratory: Positive for cough. Negative for shortness of breath.   Gastrointestinal: Negative for constipation and diarrhea.  Genitourinary: Negative for dysuria.  Skin:       Lesions in axilla    Please see HPI. All other systems reviewed and negative.     Objective:  Physical Exam Vitals reviewed.  Constitutional:      Appearance: Normal appearance. She is obese.  HENT:      Mouth/Throat:     Mouth: Mucous membranes are moist.     Pharynx: No oropharyngeal exudate.  Eyes:     Extraocular Movements: Extraocular movements intact.     Pupils: Pupils are equal, round, and reactive to light.  Cardiovascular:     Rate and Rhythm: Normal rate and regular rhythm.  Pulmonary:     Effort: Pulmonary effort is normal.     Breath sounds: Normal breath sounds.  Abdominal:     General: Bowel sounds are normal. There is no distension.     Palpations: Abdomen is soft.     Tenderness: There is no abdominal tenderness.  Musculoskeletal:     Cervical back: Normal range of motion and neck supple.     Right lower leg: No edema.     Left lower leg: No edema.  Skin:      Neurological:     Mental Status: She is alert.            Assessment & Plan:

## 2020-10-17 NOTE — Assessment & Plan Note (Signed)
She is doing to call dr furguson's office for new provider name.

## 2020-10-17 NOTE — Assessment & Plan Note (Signed)
She appears to be doing ok I appreciate her PCP f/u.  Has cut back on tobacco.

## 2020-10-17 NOTE — Assessment & Plan Note (Addendum)
She is doing well Will check her labs today Dental referral placed today for [facility]. Information to schedule appointment completed today. Not sexually active.  She asks for cabaneuva rx. WIll refer her at f/u (pharamcist will be back then, as well will more likely be qom then) Will see her back in 9 months with labs.

## 2020-10-17 NOTE — Assessment & Plan Note (Signed)
Living with mom, staying clean.

## 2020-10-22 LAB — COMPREHENSIVE METABOLIC PANEL
AG Ratio: 1.4 (calc) (ref 1.0–2.5)
ALT: 12 U/L (ref 6–29)
AST: 10 U/L (ref 10–35)
Albumin: 4.2 g/dL (ref 3.6–5.1)
Alkaline phosphatase (APISO): 74 U/L (ref 37–153)
BUN: 11 mg/dL (ref 7–25)
CO2: 25 mmol/L (ref 20–32)
Calcium: 9.5 mg/dL (ref 8.6–10.4)
Chloride: 101 mmol/L (ref 98–110)
Creat: 0.82 mg/dL (ref 0.50–1.05)
Globulin: 3 g/dL (calc) (ref 1.9–3.7)
Glucose, Bld: 151 mg/dL — ABNORMAL HIGH (ref 65–99)
Potassium: 4.7 mmol/L (ref 3.5–5.3)
Sodium: 136 mmol/L (ref 135–146)
Total Bilirubin: 0.2 mg/dL (ref 0.2–1.2)
Total Protein: 7.2 g/dL (ref 6.1–8.1)

## 2020-10-22 LAB — CBC
HCT: 40.3 % (ref 35.0–45.0)
Hemoglobin: 13.9 g/dL (ref 11.7–15.5)
MCH: 36.1 pg — ABNORMAL HIGH (ref 27.0–33.0)
MCHC: 34.5 g/dL (ref 32.0–36.0)
MCV: 104.7 fL — ABNORMAL HIGH (ref 80.0–100.0)
MPV: 9.3 fL (ref 7.5–12.5)
Platelets: 292 10*3/uL (ref 140–400)
RBC: 3.85 10*6/uL (ref 3.80–5.10)
RDW: 13.2 % (ref 11.0–15.0)
WBC: 6.2 10*3/uL (ref 3.8–10.8)

## 2020-10-22 LAB — LIPID PANEL
Cholesterol: 173 mg/dL (ref <200)
HDL: 36 mg/dL — ABNORMAL LOW (ref >=50)
LDL Cholesterol (Calc): 106 mg/dL (calc) — ABNORMAL HIGH
Non-HDL Cholesterol (Calc): 137 mg/dL (calc) — ABNORMAL HIGH (ref <130)
Total CHOL/HDL Ratio: 4.8 (calc) (ref <5.0)
Triglycerides: 184 mg/dL — ABNORMAL HIGH (ref <150)

## 2020-10-22 LAB — HIV-1 RNA QUANT-NO REFLEX-BLD
HIV 1 RNA Quant: <20 Copies/mL
HIV-1 RNA Quant, Log: <1.3 Log cps/mL

## 2020-10-22 LAB — FLUORESCENT TREPONEMAL AB(FTA)-IGG-BLD: Fluorescent Treponemal ABS: NONREACTIVE

## 2020-10-22 LAB — RPR TITER: RPR Titer: 1:1 {titer} — ABNORMAL HIGH

## 2020-10-22 LAB — RPR: RPR Ser Ql: REACTIVE — AB

## 2020-10-29 ENCOUNTER — Encounter: Payer: Medicaid Other | Admitting: Family Medicine

## 2020-11-26 ENCOUNTER — Encounter: Payer: Medicaid Other | Admitting: Family Medicine

## 2020-12-02 ENCOUNTER — Encounter: Payer: Self-pay | Admitting: Family Medicine

## 2020-12-11 ENCOUNTER — Other Ambulatory Visit (HOSPITAL_COMMUNITY): Payer: Self-pay

## 2020-12-12 MED FILL — TRIUMEQ 600-50-300 MG TABS: 600-50-300 | 30 days supply | Qty: 30 | Fill #3

## 2021-01-08 ENCOUNTER — Other Ambulatory Visit (HOSPITAL_COMMUNITY): Payer: Self-pay

## 2021-01-12 ENCOUNTER — Other Ambulatory Visit (HOSPITAL_COMMUNITY): Payer: Self-pay

## 2021-01-16 ENCOUNTER — Other Ambulatory Visit: Payer: Self-pay | Admitting: Infectious Diseases

## 2021-01-16 ENCOUNTER — Other Ambulatory Visit (HOSPITAL_COMMUNITY): Payer: Self-pay

## 2021-01-16 DIAGNOSIS — B20 Human immunodeficiency virus [HIV] disease: Secondary | ICD-10-CM

## 2021-01-16 MED ORDER — TRIUMEQ 600-50-300 MG PO TABS
1.0000 | ORAL_TABLET | Freq: Every day | ORAL | 5 refills | Status: DC
  Filled 2021-01-16: qty 30, 30d supply, fill #0
  Filled 2021-02-05: qty 30, 30d supply, fill #1
  Filled 2021-03-04: qty 30, 30d supply, fill #2
  Filled 2021-04-06: qty 30, 30d supply, fill #3
  Filled 2021-05-01: qty 30, 30d supply, fill #4
  Filled 2021-05-28: qty 30, 30d supply, fill #5

## 2021-02-05 ENCOUNTER — Other Ambulatory Visit (HOSPITAL_COMMUNITY): Payer: Self-pay

## 2021-03-04 ENCOUNTER — Other Ambulatory Visit (HOSPITAL_COMMUNITY): Payer: Self-pay

## 2021-03-11 ENCOUNTER — Other Ambulatory Visit (HOSPITAL_COMMUNITY): Payer: Self-pay

## 2021-04-02 ENCOUNTER — Other Ambulatory Visit (HOSPITAL_COMMUNITY): Payer: Self-pay

## 2021-04-06 ENCOUNTER — Other Ambulatory Visit (HOSPITAL_COMMUNITY): Payer: Self-pay

## 2021-05-01 ENCOUNTER — Other Ambulatory Visit (HOSPITAL_COMMUNITY): Payer: Self-pay

## 2021-05-04 ENCOUNTER — Other Ambulatory Visit (HOSPITAL_COMMUNITY): Payer: Self-pay

## 2021-05-25 ENCOUNTER — Other Ambulatory Visit (HOSPITAL_COMMUNITY): Payer: Self-pay

## 2021-05-28 ENCOUNTER — Other Ambulatory Visit (HOSPITAL_COMMUNITY): Payer: Self-pay

## 2021-06-25 ENCOUNTER — Other Ambulatory Visit: Payer: Self-pay | Admitting: Infectious Diseases

## 2021-06-25 ENCOUNTER — Other Ambulatory Visit (HOSPITAL_COMMUNITY): Payer: Self-pay

## 2021-06-25 DIAGNOSIS — B20 Human immunodeficiency virus [HIV] disease: Secondary | ICD-10-CM

## 2021-06-25 MED ORDER — TRIUMEQ 600-50-300 MG PO TABS
1.0000 | ORAL_TABLET | Freq: Every day | ORAL | 0 refills | Status: DC
  Filled 2021-06-25: qty 30, 30d supply, fill #0

## 2021-06-29 ENCOUNTER — Other Ambulatory Visit (HOSPITAL_COMMUNITY): Payer: Self-pay

## 2021-07-10 ENCOUNTER — Other Ambulatory Visit: Payer: Self-pay | Admitting: Infectious Diseases

## 2021-07-10 ENCOUNTER — Encounter: Payer: Self-pay | Admitting: Infectious Diseases

## 2021-07-10 ENCOUNTER — Other Ambulatory Visit: Payer: Self-pay

## 2021-07-10 ENCOUNTER — Ambulatory Visit (INDEPENDENT_AMBULATORY_CARE_PROVIDER_SITE_OTHER): Payer: Medicaid Other | Admitting: Infectious Diseases

## 2021-07-10 VITALS — BP 114/79 | HR 102 | Wt 223.0 lb

## 2021-07-10 DIAGNOSIS — Z79899 Other long term (current) drug therapy: Secondary | ICD-10-CM | POA: Diagnosis not present

## 2021-07-10 DIAGNOSIS — B20 Human immunodeficiency virus [HIV] disease: Secondary | ICD-10-CM | POA: Diagnosis not present

## 2021-07-10 DIAGNOSIS — Z113 Encounter for screening for infections with a predominantly sexual mode of transmission: Secondary | ICD-10-CM | POA: Diagnosis present

## 2021-07-10 DIAGNOSIS — B182 Chronic viral hepatitis C: Secondary | ICD-10-CM

## 2021-07-10 DIAGNOSIS — G2581 Restless legs syndrome: Secondary | ICD-10-CM

## 2021-07-10 DIAGNOSIS — K029 Dental caries, unspecified: Secondary | ICD-10-CM | POA: Diagnosis not present

## 2021-07-10 DIAGNOSIS — J439 Emphysema, unspecified: Secondary | ICD-10-CM | POA: Diagnosis not present

## 2021-07-10 DIAGNOSIS — D071 Carcinoma in situ of vulva: Secondary | ICD-10-CM | POA: Diagnosis not present

## 2021-07-10 NOTE — Progress Notes (Signed)
   Subjective:    Patient ID: Nicole Silva, female  DOB: 24-Jun-1971, 50 y.o.        MRN: [id number]   HPI 50 yo F with HIV+, treated Hep C (VL - 06-2015), cough variant asthma, obesity. Still smoking, trying to cut back, down to 1/3 ppd (09-18-19).  Has been drug free for 7 yrs. except for marijuana for apetite. Quit ETOH as well.  On descovy/tivicay--> triumeq (changed 10-2016). No problems.   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  She had GYN f/u 12-2017 and got depo injection. f/u 1 year. 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 She will find another GYN as this one retired.   Would like rx for restless legs, "going all night". Has occas difficulty walking.  "I feel good, i've lost some weight". 45# down since 2020.    HIV 1 RNA Quant  Date Value  10/17/2020 <20 Copies/mL  06/22/2019 <20 NOT DETECTED copies/mL  11/29/2018 16,500 copies/mL (H)   CD4 T Cell Abs (/uL)  Date Value  06/22/2019 139 (L)  10/31/2018 130 (L)  04/10/2018 150 (L)     Health Maintenance  Topic Date Due   Zoster Vaccines- Shingrix (1 of 2) Never done   COLONOSCOPY (Pts 45-42yrs Insurance coverage will need to be confirmed)  Never done   PAP SMEAR-Modifier  03/31/2018   MAMMOGRAM  08/03/2020   COVID-19 Vaccine (4 - Booster for Moderna series) 11/18/2020   TETANUS/TDAP  03/28/2027   Pneumococcal Vaccine 27-11 Years old (4 - PPSV23 if available, else PCV20) 04/07/2034   INFLUENZA VACCINE  Completed   Hepatitis C Screening  Completed   HIV Screening  Completed   HPV VACCINES  Aged Out      Review of Systems  Constitutional:  Negative for chills and fever.  Respiratory:  Negative for cough and shortness of breath.   Gastrointestinal:  Negative for constipation and diarrhea.  Genitourinary:  Negative for dysuria.  Neurological:  Positive for tremors.   Please see HPI. All other systems reviewed and  negative.     Objective:  Physical Exam         Assessment & Plan:

## 2021-07-10 NOTE — Assessment & Plan Note (Signed)
Will assess her F score and need for repeat imaging.

## 2021-07-10 NOTE — Addendum Note (Signed)
Addended by: Caffie Pinto on: 07/10/2021 11:58 AM   Modules accepted: Orders

## 2021-07-10 NOTE — Addendum Note (Signed)
Addended by: Caffie Pinto on: 07/10/2021 12:07 PM   Modules accepted: Orders

## 2021-07-10 NOTE — Assessment & Plan Note (Signed)
She will f/u with [facility] dental.

## 2021-07-10 NOTE — Assessment & Plan Note (Signed)
Appears stable.  She has gotten flu vax, refuses this fall's COVID booster.  Appreciate PCP f/u.

## 2021-07-10 NOTE — Assessment & Plan Note (Signed)
Will check her Iron She states she will f/u with her PCP for rx Not clear if her from her anxiety, bipolar rx or new dx.

## 2021-07-10 NOTE — Assessment & Plan Note (Signed)
Will check her labs today Check her Hep S Ab for response. Offered/refused condoms.  Refer to dental.  She appears to be doing well.  Psych f/u rtc in 9 months with labs.

## 2021-07-10 NOTE — Assessment & Plan Note (Signed)
She will f/u with her GYN practice for new provider.

## 2021-07-15 LAB — LIVER FIBROSIS, FIBROTEST-ACTITEST
ALT: 15 U/L (ref 6–29)
Alpha-2-Macroglobulin: 402 mg/dL — ABNORMAL HIGH (ref 106–279)
Apolipoprotein A1: 139 mg/dL (ref 101–198)
Bilirubin: 0.3 mg/dL (ref 0.2–1.2)
Fibrosis Score: 0.25
GGT: 39 U/L (ref 3–70)
Haptoglobin: 490 mg/dL — ABNORMAL HIGH (ref 43–212)
Necroinflammat ACT Score: 0.05
Reference ID: 4090859

## 2021-07-15 LAB — FLUORESCENT TREPONEMAL AB(FTA)-IGG-BLD: Fluorescent Treponemal ABS: NONREACTIVE

## 2021-07-15 LAB — COMPREHENSIVE METABOLIC PANEL
AG Ratio: 1.3 (calc) (ref 1.0–2.5)
ALT: 15 U/L (ref 6–29)
AST: 15 U/L (ref 10–35)
Albumin: 4.3 g/dL (ref 3.6–5.1)
Alkaline phosphatase (APISO): 78 U/L (ref 37–153)
BUN: 7 mg/dL (ref 7–25)
CO2: 23 mmol/L (ref 20–32)
Calcium: 9.7 mg/dL (ref 8.6–10.4)
Chloride: 101 mmol/L (ref 98–110)
Creat: 0.78 mg/dL (ref 0.50–1.03)
Globulin: 3.4 g/dL (calc) (ref 1.9–3.7)
Glucose, Bld: 115 mg/dL — ABNORMAL HIGH (ref 65–99)
Potassium: 4.1 mmol/L (ref 3.5–5.3)
Sodium: 135 mmol/L (ref 135–146)
Total Bilirubin: 0.3 mg/dL (ref 0.2–1.2)
Total Protein: 7.7 g/dL (ref 6.1–8.1)

## 2021-07-15 LAB — T-HELPER CELLS (CD4) COUNT (NOT AT ARMC)
Absolute CD4: 242 cells/uL — ABNORMAL LOW (ref 490–1740)
CD4 T Helper %: 12 % — ABNORMAL LOW (ref 30–61)
Total lymphocyte count: 2030 cells/uL (ref 850–3900)

## 2021-07-15 LAB — CBC
HCT: 43 % (ref 35.0–45.0)
Hemoglobin: 15.3 g/dL (ref 11.7–15.5)
MCH: 36.5 pg — ABNORMAL HIGH (ref 27.0–33.0)
MCHC: 35.6 g/dL (ref 32.0–36.0)
MCV: 102.6 fL — ABNORMAL HIGH (ref 80.0–100.0)
MPV: 9.5 fL (ref 7.5–12.5)
Platelets: 318 10*3/uL (ref 140–400)
RBC: 4.19 10*6/uL (ref 3.80–5.10)
RDW: 13.3 % (ref 11.0–15.0)
WBC: 11.1 10*3/uL — ABNORMAL HIGH (ref 3.8–10.8)

## 2021-07-15 LAB — HEPATITIS B SURFACE ANTIBODY,QUALITATIVE: Hep B S Ab: NONREACTIVE

## 2021-07-15 LAB — HIV-1 RNA QUANT-NO REFLEX-BLD
HIV 1 RNA Quant: NOT DETECTED Copies/mL
HIV-1 RNA Quant, Log: NOT DETECTED Log cps/mL

## 2021-07-15 LAB — RPR TITER: RPR Titer: 1:2 {titer} — ABNORMAL HIGH

## 2021-07-15 LAB — RPR: RPR Ser Ql: REACTIVE — AB

## 2021-07-15 LAB — IRON: Iron: 80 ug/dL (ref 45–160)

## 2021-07-22 ENCOUNTER — Other Ambulatory Visit: Payer: Self-pay | Admitting: Infectious Diseases

## 2021-07-22 ENCOUNTER — Other Ambulatory Visit (HOSPITAL_COMMUNITY): Payer: Self-pay

## 2021-07-22 DIAGNOSIS — B20 Human immunodeficiency virus [HIV] disease: Secondary | ICD-10-CM

## 2021-07-22 MED ORDER — TRIUMEQ 600-50-300 MG PO TABS
1.0000 | ORAL_TABLET | Freq: Every day | ORAL | 5 refills | Status: DC
  Filled 2021-07-22: qty 30, 30d supply, fill #0
  Filled 2021-08-18: qty 30, 30d supply, fill #1
  Filled 2021-09-10: qty 30, 30d supply, fill #2
  Filled 2021-10-12: qty 30, 30d supply, fill #3
  Filled 2021-11-09: qty 30, 30d supply, fill #4
  Filled 2022-01-11: qty 30, 30d supply, fill #5

## 2021-07-24 ENCOUNTER — Other Ambulatory Visit (HOSPITAL_COMMUNITY): Payer: Self-pay

## 2021-08-04 ENCOUNTER — Ambulatory Visit: Payer: Medicaid Other | Admitting: Infectious Diseases

## 2021-08-18 ENCOUNTER — Other Ambulatory Visit (HOSPITAL_COMMUNITY): Payer: Self-pay

## 2021-08-20 ENCOUNTER — Other Ambulatory Visit (HOSPITAL_COMMUNITY): Payer: Self-pay

## 2021-09-10 ENCOUNTER — Other Ambulatory Visit (HOSPITAL_COMMUNITY): Payer: Self-pay

## 2021-09-15 ENCOUNTER — Other Ambulatory Visit (HOSPITAL_COMMUNITY): Payer: Self-pay

## 2021-10-08 ENCOUNTER — Other Ambulatory Visit (HOSPITAL_COMMUNITY): Payer: Self-pay

## 2021-10-12 ENCOUNTER — Other Ambulatory Visit (HOSPITAL_COMMUNITY): Payer: Self-pay

## 2021-10-13 ENCOUNTER — Other Ambulatory Visit (HOSPITAL_COMMUNITY): Payer: Self-pay

## 2021-11-06 ENCOUNTER — Other Ambulatory Visit (HOSPITAL_COMMUNITY): Payer: Self-pay

## 2021-11-09 ENCOUNTER — Other Ambulatory Visit (HOSPITAL_COMMUNITY): Payer: Self-pay

## 2021-11-10 ENCOUNTER — Other Ambulatory Visit (HOSPITAL_COMMUNITY): Payer: Self-pay

## 2021-12-01 ENCOUNTER — Other Ambulatory Visit (HOSPITAL_COMMUNITY): Payer: Self-pay

## 2021-12-03 ENCOUNTER — Other Ambulatory Visit (HOSPITAL_COMMUNITY): Payer: Self-pay

## 2021-12-07 ENCOUNTER — Other Ambulatory Visit (HOSPITAL_COMMUNITY): Payer: Self-pay

## 2022-01-11 ENCOUNTER — Other Ambulatory Visit (HOSPITAL_COMMUNITY): Payer: Self-pay

## 2022-01-12 ENCOUNTER — Other Ambulatory Visit (HOSPITAL_COMMUNITY): Payer: Self-pay

## 2022-02-01 ENCOUNTER — Other Ambulatory Visit: Payer: Self-pay | Admitting: Infectious Diseases

## 2022-02-01 ENCOUNTER — Other Ambulatory Visit (HOSPITAL_COMMUNITY): Payer: Self-pay

## 2022-02-01 DIAGNOSIS — B20 Human immunodeficiency virus [HIV] disease: Secondary | ICD-10-CM

## 2022-02-01 MED ORDER — TRIUMEQ 600-50-300 MG PO TABS
1.0000 | ORAL_TABLET | Freq: Every day | ORAL | 2 refills | Status: DC
  Filled 2022-02-03: qty 30, 30d supply, fill #0
  Filled 2022-03-08: qty 30, 30d supply, fill #1
  Filled 2022-04-01: qty 30, 30d supply, fill #2

## 2022-02-03 ENCOUNTER — Other Ambulatory Visit (HOSPITAL_COMMUNITY): Payer: Self-pay

## 2022-02-09 ENCOUNTER — Other Ambulatory Visit (HOSPITAL_COMMUNITY): Payer: Self-pay

## 2022-02-10 ENCOUNTER — Other Ambulatory Visit (HOSPITAL_COMMUNITY): Payer: Self-pay

## 2022-02-14 ENCOUNTER — Encounter: Payer: Self-pay | Admitting: Infectious Diseases

## 2022-03-04 ENCOUNTER — Other Ambulatory Visit (HOSPITAL_COMMUNITY): Payer: Self-pay

## 2022-03-08 ENCOUNTER — Other Ambulatory Visit (HOSPITAL_COMMUNITY): Payer: Self-pay

## 2022-03-12 ENCOUNTER — Telehealth: Payer: Self-pay | Admitting: *Deleted

## 2022-03-12 NOTE — Telephone Encounter (Signed)
Fax rcvd from [facility] for orders & CMN to be signed for incontinence supplies Faxed back with note Pt has not been seen since 10/06/20

## 2022-03-31 ENCOUNTER — Other Ambulatory Visit (HOSPITAL_COMMUNITY): Payer: Self-pay

## 2022-04-01 ENCOUNTER — Other Ambulatory Visit (HOSPITAL_COMMUNITY): Payer: Self-pay

## 2022-04-02 ENCOUNTER — Other Ambulatory Visit (HOSPITAL_COMMUNITY): Payer: Self-pay

## 2022-04-02 ENCOUNTER — Encounter: Payer: Self-pay | Admitting: Internal Medicine

## 2022-04-02 ENCOUNTER — Ambulatory Visit (INDEPENDENT_AMBULATORY_CARE_PROVIDER_SITE_OTHER): Payer: Medicaid Other | Admitting: Internal Medicine

## 2022-04-02 ENCOUNTER — Other Ambulatory Visit (HOSPITAL_COMMUNITY)
Admission: RE | Admit: 2022-04-02 | Discharge: 2022-04-02 | Disposition: A | Discharge status: Home / Self Care | Payer: Medicaid Other | Source: Ambulatory Visit | Attending: Infectious Diseases | Admitting: Infectious Diseases

## 2022-04-02 ENCOUNTER — Other Ambulatory Visit: Payer: Self-pay

## 2022-04-02 ENCOUNTER — Ambulatory Visit: Payer: Medicaid Other | Admitting: Infectious Diseases

## 2022-04-02 VITALS — BP 119/83 | HR 86 | Resp 16 | Ht 65.0 in | Wt 205.4 lb

## 2022-04-02 DIAGNOSIS — Z23 Encounter for immunization: Secondary | ICD-10-CM

## 2022-04-02 DIAGNOSIS — B20 Human immunodeficiency virus [HIV] disease: Secondary | ICD-10-CM | POA: Diagnosis not present

## 2022-04-02 MED ORDER — NICOTINE 14 MG/24HR TD PT24
14.0000 mg | MEDICATED_PATCH | TRANSDERMAL | 0 refills | Status: AC
  Filled 2022-04-02 (×2): qty 28, 28d supply, fill #0
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
## 2022-04-05 ENCOUNTER — Other Ambulatory Visit (HOSPITAL_COMMUNITY): Payer: Self-pay

## 2022-04-05 LAB — CYTOLOGY, (ORAL, ANAL, URETHRAL) ANCILLARY ONLY
Chlamydia: NEGATIVE
Comment: NEGATIVE
Comment: NORMAL
Neisseria Gonorrhea: NEGATIVE

## 2022-04-05 LAB — URINE CYTOLOGY ANCILLARY ONLY
Chlamydia: NEGATIVE
Comment: NEGATIVE
Comment: NORMAL
Neisseria Gonorrhea: NEGATIVE

## 2022-04-07 LAB — COMPREHENSIVE METABOLIC PANEL WITH GFR
AG Ratio: 1.3 (calc) (ref 1.0–2.5)
ALT: 13 U/L (ref 6–29)
AST: 13 U/L (ref 10–35)
Albumin: 4.3 g/dL (ref 3.6–5.1)
Alkaline phosphatase (APISO): 80 U/L (ref 37–153)
BUN: 8 mg/dL (ref 7–25)
CO2: 20 mmol/L (ref 20–32)
Calcium: 9.6 mg/dL (ref 8.6–10.4)
Chloride: 104 mmol/L (ref 98–110)
Creat: 0.86 mg/dL (ref 0.50–1.03)
Globulin: 3.3 g/dL (ref 1.9–3.7)
Glucose, Bld: 104 mg/dL — ABNORMAL HIGH (ref 65–99)
Potassium: 4.1 mmol/L (ref 3.5–5.3)
Sodium: 136 mmol/L (ref 135–146)
Total Bilirubin: 0.3 mg/dL (ref 0.2–1.2)
Total Protein: 7.6 g/dL (ref 6.1–8.1)

## 2022-04-07 LAB — CBC
HCT: 44 % (ref 35.0–45.0)
Hemoglobin: 15.3 g/dL (ref 11.7–15.5)
MCH: 35.9 pg — ABNORMAL HIGH (ref 27.0–33.0)
MCHC: 34.8 g/dL (ref 32.0–36.0)
MCV: 103.3 fL — ABNORMAL HIGH (ref 80.0–100.0)
MPV: 9.1 fL (ref 7.5–12.5)
Platelets: 313 10*3/uL (ref 140–400)
RBC: 4.26 10*6/uL (ref 3.80–5.10)
RDW: 13.5 % (ref 11.0–15.0)
WBC: 8.4 10*3/uL (ref 3.8–10.8)

## 2022-04-07 LAB — URINALYSIS, ROUTINE W REFLEX MICROSCOPIC
Bacteria, UA: NONE SEEN /HPF
Bilirubin Urine: NEGATIVE
Glucose, UA: NEGATIVE
Hgb urine dipstick: NEGATIVE
Ketones, ur: NEGATIVE
Nitrite: NEGATIVE
Protein, ur: NEGATIVE
RBC / HPF: NONE SEEN /HPF (ref 0–2)
Specific Gravity, Urine: 1.008 (ref 1.001–1.035)
pH: 6 (ref 5.0–8.0)

## 2022-04-07 LAB — HIV-1 RNA QUANT-NO REFLEX-BLD
HIV 1 RNA Quant: NOT DETECTED Copies/mL
HIV-1 RNA Quant, Log: NOT DETECTED Log cps/mL

## 2022-04-07 LAB — RPR TITER: RPR Titer: 1:2 {titer} — ABNORMAL HIGH

## 2022-04-07 LAB — T-HELPER CELLS (CD4) COUNT (NOT AT ARMC)
Absolute CD4: 305 {cells}/uL — ABNORMAL LOW (ref 490–1740)
CD4 T Helper %: 12 % — ABNORMAL LOW (ref 30–61)
Total lymphocyte count: 2592 {cells}/uL (ref 850–3900)

## 2022-04-07 LAB — HEPATITIS B SURFACE ANTIBODY,QUALITATIVE: Hep B S Ab: NONREACTIVE

## 2022-04-07 LAB — FLUORESCENT TREPONEMAL AB(FTA)-IGG-BLD: Fluorescent Treponemal ABS: REACTIVE — AB

## 2022-04-07 LAB — RPR: RPR Ser Ql: REACTIVE — AB

## 2022-04-23 ENCOUNTER — Other Ambulatory Visit: Payer: Self-pay | Admitting: Infectious Diseases

## 2022-04-23 ENCOUNTER — Other Ambulatory Visit (HOSPITAL_COMMUNITY): Payer: Self-pay

## 2022-04-23 DIAGNOSIS — B20 Human immunodeficiency virus [HIV] disease: Secondary | ICD-10-CM

## 2022-04-23 MED ORDER — TRIUMEQ 600-50-300 MG PO TABS
1.0000 | ORAL_TABLET | Freq: Every day | ORAL | 6 refills | Status: DC
  Filled 2022-04-23: qty 30, 30d supply, fill #0
  Filled 2022-05-21: qty 30, 30d supply, fill #1
  Filled 2022-06-23: qty 30, 30d supply, fill #2
  Filled 2022-07-15: qty 30, 30d supply, fill #3
  Filled 2022-08-11 – 2022-08-17 (×2): qty 30, 30d supply, fill #4
  Filled 2022-09-07: qty 30, 30d supply, fill #5
  Filled 2022-10-05: qty 30, 30d supply, fill #6

## 2022-04-28 ENCOUNTER — Other Ambulatory Visit (HOSPITAL_COMMUNITY): Payer: Self-pay

## 2022-05-21 ENCOUNTER — Other Ambulatory Visit (HOSPITAL_COMMUNITY): Payer: Self-pay

## 2022-05-24 ENCOUNTER — Other Ambulatory Visit (HOSPITAL_COMMUNITY): Payer: Self-pay

## 2022-06-23 ENCOUNTER — Other Ambulatory Visit (HOSPITAL_COMMUNITY): Payer: Self-pay

## 2022-07-15 ENCOUNTER — Other Ambulatory Visit (HOSPITAL_COMMUNITY): Payer: Self-pay

## 2022-07-22 ENCOUNTER — Other Ambulatory Visit (HOSPITAL_COMMUNITY): Payer: Self-pay

## 2022-08-11 ENCOUNTER — Other Ambulatory Visit (HOSPITAL_COMMUNITY): Payer: Self-pay

## 2022-08-17 ENCOUNTER — Other Ambulatory Visit (HOSPITAL_COMMUNITY): Payer: Self-pay

## 2022-09-07 ENCOUNTER — Other Ambulatory Visit (HOSPITAL_COMMUNITY): Payer: Self-pay

## 2022-10-05 ENCOUNTER — Other Ambulatory Visit (HOSPITAL_COMMUNITY): Payer: Self-pay

## 2022-10-11 ENCOUNTER — Ambulatory Visit (INDEPENDENT_AMBULATORY_CARE_PROVIDER_SITE_OTHER): Payer: Medicaid Other | Admitting: Internal Medicine

## 2022-10-11 ENCOUNTER — Encounter: Payer: Self-pay | Admitting: Internal Medicine

## 2022-10-11 ENCOUNTER — Other Ambulatory Visit: Payer: Self-pay

## 2022-10-11 VITALS — BP 110/78 | HR 87 | Temp 97.9°F | Wt 190.6 lb

## 2022-10-11 DIAGNOSIS — B20 Human immunodeficiency virus [HIV] disease: Secondary | ICD-10-CM

## 2022-10-11 DIAGNOSIS — Z23 Encounter for immunization: Secondary | ICD-10-CM
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
## 2022-10-12 LAB — T-HELPER CELLS (CD4) COUNT (NOT AT ARMC)
CD4 % Helper T Cell: 11 % — ABNORMAL LOW (ref 33–65)
CD4 T Cell Abs: 328 /uL — ABNORMAL LOW (ref 400–1790)

## 2022-10-13 ENCOUNTER — Other Ambulatory Visit (HOSPITAL_COMMUNITY): Payer: Self-pay

## 2022-10-14 LAB — CBC WITH DIFFERENTIAL/PLATELET
Absolute Monocytes: 582 cells/uL (ref 200–950)
Basophils Absolute: 31 cells/uL (ref 0–200)
Basophils Relative: 0.3 %
Eosinophils Absolute: 146 cells/uL (ref 15–500)
Eosinophils Relative: 1.4 %
HCT: 42.8 % (ref 35.0–45.0)
Hemoglobin: 14.8 g/dL (ref 11.7–15.5)
Lymphs Abs: 3505 cells/uL (ref 850–3900)
MCH: 34.2 pg — ABNORMAL HIGH (ref 27.0–33.0)
MCHC: 34.6 g/dL (ref 32.0–36.0)
MCV: 98.8 fL (ref 80.0–100.0)
MPV: 10.1 fL (ref 7.5–12.5)
Monocytes Relative: 5.6 %
Neutro Abs: 6136 cells/uL (ref 1500–7800)
Neutrophils Relative %: 59 %
Platelets: 321 10*3/uL (ref 140–400)
RBC: 4.33 10*6/uL (ref 3.80–5.10)
RDW: 13.3 % (ref 11.0–15.0)
Total Lymphocyte: 33.7 %
WBC: 10.4 10*3/uL (ref 3.8–10.8)

## 2022-10-14 LAB — COMPLETE METABOLIC PANEL WITH GFR
AG Ratio: 1.1 (calc) (ref 1.0–2.5)
ALT: 13 U/L (ref 6–29)
AST: 12 U/L (ref 10–35)
Albumin: 4 g/dL (ref 3.6–5.1)
Alkaline phosphatase (APISO): 57 U/L (ref 37–153)
BUN: 9 mg/dL (ref 7–25)
CO2: 23 mmol/L (ref 20–32)
Calcium: 9.3 mg/dL (ref 8.6–10.4)
Chloride: 105 mmol/L (ref 98–110)
Creat: 0.71 mg/dL (ref 0.50–1.03)
Globulin: 3.5 g/dL (calc) (ref 1.9–3.7)
Glucose, Bld: 99 mg/dL (ref 65–99)
Potassium: 3.9 mmol/L (ref 3.5–5.3)
Sodium: 139 mmol/L (ref 135–146)
Total Bilirubin: 0.3 mg/dL (ref 0.2–1.2)
Total Protein: 7.5 g/dL (ref 6.1–8.1)
eGFR: 102 mL/min/{1.73_m2} (ref >=60)

## 2022-10-14 LAB — HEPATITIS C RNA QUANTITATIVE
HCV Quantitative Log: <1.18 log IU/mL
HCV RNA, PCR, QN: <15 IU/mL

## 2022-10-14 LAB — HIV-1 RNA QUANT-NO REFLEX-BLD
HIV 1 RNA Quant: NOT DETECTED Copies/mL
HIV-1 RNA Quant, Log: NOT DETECTED Log cps/mL

## 2022-10-14 LAB — QUANTIFERON-TB GOLD PLUS
Mitogen-NIL: >10 IU/mL
NIL: 0.04 IU/mL
QuantiFERON-TB Gold Plus: NEGATIVE
TB1-NIL: <0 IU/mL
TB2-NIL: 0.26 IU/mL

## 2022-11-09 ENCOUNTER — Encounter: Payer: Self-pay | Admitting: Family Medicine

## 2022-11-10 ENCOUNTER — Other Ambulatory Visit: Payer: Self-pay

## 2022-11-10 ENCOUNTER — Other Ambulatory Visit (HOSPITAL_COMMUNITY): Payer: Self-pay

## 2022-11-10 ENCOUNTER — Other Ambulatory Visit: Payer: Self-pay | Admitting: Internal Medicine

## 2022-11-10 DIAGNOSIS — B20 Human immunodeficiency virus [HIV] disease: Secondary | ICD-10-CM

## 2022-11-10 MED ORDER — TRIUMEQ 600-50-300 MG PO TABS
1.0000 | ORAL_TABLET | Freq: Every day | ORAL | 6 refills | Status: DC
  Filled 2022-11-10: qty 30, 30d supply, fill #0
  Filled 2022-12-08: qty 30, 30d supply, fill #1
  Filled 2023-01-04: qty 30, 30d supply, fill #2
  Filled 2023-01-31: qty 30, 30d supply, fill #3
  Filled 2023-03-02: qty 30, 30d supply, fill #4
  Filled 2023-03-30: qty 30, 30d supply, fill #5
  Filled 2023-04-27: qty 30, 30d supply, fill #6

## 2022-11-12 ENCOUNTER — Other Ambulatory Visit (HOSPITAL_COMMUNITY): Payer: Self-pay

## 2022-12-08 ENCOUNTER — Other Ambulatory Visit (HOSPITAL_COMMUNITY): Payer: Self-pay

## 2023-01-04 ENCOUNTER — Other Ambulatory Visit (HOSPITAL_COMMUNITY): Payer: Self-pay

## 2023-01-06 ENCOUNTER — Other Ambulatory Visit (HOSPITAL_COMMUNITY): Payer: Self-pay

## 2023-01-31 ENCOUNTER — Other Ambulatory Visit: Payer: Self-pay

## 2023-02-01 ENCOUNTER — Other Ambulatory Visit (HOSPITAL_COMMUNITY): Payer: Self-pay

## 2023-02-22 ENCOUNTER — Other Ambulatory Visit (HOSPITAL_COMMUNITY): Payer: Self-pay

## 2023-03-02 ENCOUNTER — Other Ambulatory Visit (HOSPITAL_COMMUNITY): Payer: Self-pay

## 2023-03-14 ENCOUNTER — Telehealth: Payer: Self-pay

## 2023-03-14 ENCOUNTER — Ambulatory Visit: Payer: MEDICAID | Admitting: Internal Medicine
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
## 2023-03-14 NOTE — Telephone Encounter (Signed)
Called patient to reschedule today's missed appointment, no answer and unable to leave a message.   Sandie Ano, RN

## 2023-03-30 ENCOUNTER — Other Ambulatory Visit (HOSPITAL_COMMUNITY): Payer: Self-pay

## 2023-04-27 ENCOUNTER — Other Ambulatory Visit (HOSPITAL_COMMUNITY): Payer: Self-pay

## 2023-04-29 ENCOUNTER — Other Ambulatory Visit (HOSPITAL_COMMUNITY): Payer: Self-pay

## 2023-05-11 ENCOUNTER — Ambulatory Visit (INDEPENDENT_AMBULATORY_CARE_PROVIDER_SITE_OTHER): Payer: MEDICAID | Admitting: Internal Medicine

## 2023-05-11 ENCOUNTER — Encounter: Payer: Self-pay | Admitting: Internal Medicine

## 2023-05-11 ENCOUNTER — Other Ambulatory Visit: Payer: Self-pay

## 2023-05-11 VITALS — BP 115/79 | HR 65 | Temp 97.8°F | Ht 65.5 in | Wt 201.0 lb

## 2023-05-11 DIAGNOSIS — B20 Human immunodeficiency virus [HIV] disease: Secondary | ICD-10-CM

## 2023-05-11 DIAGNOSIS — Z23 Encounter for immunization: Secondary | ICD-10-CM | POA: Diagnosis not present
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
## 2023-05-12 LAB — T-HELPER CELLS (CD4) COUNT (NOT AT ARMC)
CD4 % Helper T Cell: 13 % — ABNORMAL LOW (ref 33–65)
CD4 T Cell Abs: 278 /uL — ABNORMAL LOW (ref 400–1790)

## 2023-05-13 LAB — CBC WITH DIFFERENTIAL/PLATELET
Absolute Monocytes: 488 {cells}/uL (ref 200–950)
Basophils Absolute: 40 {cells}/uL (ref 0–200)
Basophils Relative: 0.5 %
Eosinophils Absolute: 120 {cells}/uL (ref 15–500)
Eosinophils Relative: 1.5 %
HCT: 44 % (ref 35.0–45.0)
Hemoglobin: 15.2 g/dL (ref 11.7–15.5)
Lymphs Abs: 2752 {cells}/uL (ref 850–3900)
MCH: 34.2 pg — ABNORMAL HIGH (ref 27.0–33.0)
MCHC: 34.5 g/dL (ref 32.0–36.0)
MCV: 99.1 fL (ref 80.0–100.0)
MPV: 9.3 fL (ref 7.5–12.5)
Monocytes Relative: 6.1 %
Neutro Abs: 4600 {cells}/uL (ref 1500–7800)
Neutrophils Relative %: 57.5 %
Platelets: 325 10*3/uL (ref 140–400)
RBC: 4.44 10*6/uL (ref 3.80–5.10)
RDW: 14.3 % (ref 11.0–15.0)
Total Lymphocyte: 34.4 %
WBC: 8 10*3/uL (ref 3.8–10.8)

## 2023-05-13 LAB — COMPLETE METABOLIC PANEL WITH GFR
AG Ratio: 1.2 (calc) (ref 1.0–2.5)
ALT: 17 U/L (ref 6–29)
AST: 12 U/L (ref 10–35)
Albumin: 4.2 g/dL (ref 3.6–5.1)
Alkaline phosphatase (APISO): 68 U/L (ref 37–153)
BUN: 18 mg/dL (ref 7–25)
CO2: 23 mmol/L (ref 20–32)
Calcium: 9.8 mg/dL (ref 8.6–10.4)
Chloride: 102 mmol/L (ref 98–110)
Creat: 0.76 mg/dL (ref 0.50–1.03)
Globulin: 3.4 g/dL (ref 1.9–3.7)
Glucose, Bld: 101 mg/dL — ABNORMAL HIGH (ref 65–99)
Potassium: 4.4 mmol/L (ref 3.5–5.3)
Sodium: 134 mmol/L — ABNORMAL LOW (ref 135–146)
Total Bilirubin: 0.2 mg/dL (ref 0.2–1.2)
Total Protein: 7.6 g/dL (ref 6.1–8.1)
eGFR: 93 mL/min/{1.73_m2} (ref >=60)

## 2023-05-13 LAB — HIV-1 RNA QUANT-NO REFLEX-BLD
HIV 1 RNA Quant: NOT DETECTED {copies}/mL
HIV-1 RNA Quant, Log: NOT DETECTED {Log_copies}/mL

## 2023-05-20 ENCOUNTER — Other Ambulatory Visit: Payer: Self-pay | Admitting: Internal Medicine

## 2023-05-20 ENCOUNTER — Other Ambulatory Visit (HOSPITAL_COMMUNITY): Payer: Self-pay

## 2023-05-20 DIAGNOSIS — B20 Human immunodeficiency virus [HIV] disease: Secondary | ICD-10-CM

## 2023-05-23 ENCOUNTER — Other Ambulatory Visit: Payer: Self-pay

## 2023-05-23 ENCOUNTER — Other Ambulatory Visit (HOSPITAL_COMMUNITY): Payer: Self-pay

## 2023-05-23 MED ORDER — TRIUMEQ 600-50-300 MG PO TABS
1.0000 | ORAL_TABLET | Freq: Every day | ORAL | 6 refills | Status: DC
Start: 2023-05-23 — End: 2023-11-30
  Filled 2023-05-23: qty 30, 30d supply, fill #0
  Filled 2023-06-23: qty 30, 30d supply, fill #1
  Filled 2023-07-18: qty 30, 30d supply, fill #2
  Filled 2023-08-19: qty 30, 30d supply, fill #3
  Filled 2023-09-13: qty 30, 30d supply, fill #4
  Filled 2023-10-07: qty 30, 30d supply, fill #5
  Filled 2023-11-08: qty 30, 30d supply, fill #6

## 2023-06-23 ENCOUNTER — Other Ambulatory Visit: Payer: Self-pay

## 2023-06-23 NOTE — Progress Notes (Signed)
Specialty Pharmacy Refill Coordination Note  Nicole Silva is a 52 y.o. female contacted today regarding refills of specialty medication(s) Abacavir-Dolutegravir-Lamivud   Patient requested Delivery   Delivery date: 06/29/23   Verified address: [address]   [place] [address]   Medication will be filled on 06/28/23.

## 2023-07-13 ENCOUNTER — Encounter: Payer: Self-pay | Admitting: Family Medicine

## 2023-07-14 ENCOUNTER — Other Ambulatory Visit: Payer: Self-pay

## 2023-07-14 ENCOUNTER — Other Ambulatory Visit (HOSPITAL_COMMUNITY): Payer: Self-pay

## 2023-07-18 ENCOUNTER — Other Ambulatory Visit: Payer: Self-pay

## 2023-07-18 NOTE — Progress Notes (Signed)
Specialty Pharmacy Ongoing Clinical Assessment Note  Nicole Silva is a 52 y.o. female who is being followed by the specialty pharmacy service for RxSp HIV   Patient's specialty medication(s) reviewed today: Abacavir-Dolutegravir-Lamivud   Missed doses in the last 4 weeks: 2   Patient/Caregiver did not have any additional questions or concerns.   Therapeutic benefit summary: Patient is achieving benefit   Adverse events/side effects summary: No adverse events/side effects   Patient's therapy is appropriate to: Continue    Goals Addressed             This Visit's Progress    Achieve Undetectable HIV Viral Load < 20       Patient is on track. Patient will maintain adherence         Follow up:  6 months  Bobette Mo Specialty Pharmacist

## 2023-07-18 NOTE — Progress Notes (Signed)
Specialty Pharmacy Refill Coordination Note  Sharona Rovner is a 52 y.o. female contacted today regarding refills of specialty medication(s) Abacavir-Dolutegravir-Lamivud   Patient requested Delivery   Delivery date: 07/21/23   Verified address: [address]   Medication will be filled on 07/20/23.

## 2023-08-04 ENCOUNTER — Other Ambulatory Visit: Payer: Self-pay

## 2023-08-15 ENCOUNTER — Other Ambulatory Visit (HOSPITAL_COMMUNITY): Payer: Self-pay

## 2023-08-19 ENCOUNTER — Other Ambulatory Visit (HOSPITAL_COMMUNITY): Payer: Self-pay | Admitting: Pharmacy Technician

## 2023-08-19 ENCOUNTER — Other Ambulatory Visit (HOSPITAL_COMMUNITY): Payer: Self-pay

## 2023-08-19 NOTE — Progress Notes (Signed)
Specialty Pharmacy Refill Coordination Note  Nicole Silva is a 52 y.o. female contacted today regarding refills of specialty medication(s) Abacavir-Dolutegravir-Lamivud   Patient requested Delivery   Delivery date: 08/23/23   Verified address: [address]  [place]   Medication will be filled on 08/22/23.

## 2023-08-22 ENCOUNTER — Other Ambulatory Visit: Payer: Self-pay

## 2023-09-13 ENCOUNTER — Other Ambulatory Visit: Payer: Self-pay

## 2023-09-13 NOTE — Progress Notes (Signed)
 Specialty Pharmacy Refill Coordination Note  Nicole Silva is a 52 y.o. female contacted today regarding refills of specialty medication(s) Abacavir -Dolutegravir -Lamivud (Triumeq )   Patient requested Delivery   Delivery date: 09/19/23   Verified address: [address]  [place] [address]   Medication will be filled on 09/16/23.

## 2023-10-07 ENCOUNTER — Other Ambulatory Visit: Payer: Self-pay

## 2023-10-07 NOTE — Progress Notes (Signed)
Specialty Pharmacy Refill Coordination Note  Nicole Silva is a 53 y.o. female contacted today regarding refills of specialty medication(s) Abacavir-Dolutegravir-Lamivud [name])   Patient requested Delivery   Delivery date: 10/17/23   Verified address: [address]  [name] [address]   Medication will be filled on 01.31.25.

## 2023-10-14 ENCOUNTER — Other Ambulatory Visit: Payer: Self-pay

## 2023-11-08 ENCOUNTER — Other Ambulatory Visit (HOSPITAL_COMMUNITY): Payer: Self-pay

## 2023-11-08 ENCOUNTER — Other Ambulatory Visit: Payer: Self-pay

## 2023-11-08 NOTE — Progress Notes (Signed)
 Specialty Pharmacy Refill Coordination Note  Nicole Silva is a 52 y.o. female contacted today regarding refills of specialty medication(s) Abacavir-Dolutegravir-Lamivud Nicole Silva)   Patient requested Delivery   Delivery date: 11/10/23   Verified address: [address]   [place] [address]   Medication will be filled on 11/09/23.

## 2023-11-09 ENCOUNTER — Other Ambulatory Visit: Payer: Self-pay

## 2023-11-11 ENCOUNTER — Ambulatory Visit: Payer: MEDICAID | Admitting: Internal Medicine

## 2023-11-30 ENCOUNTER — Other Ambulatory Visit: Payer: Self-pay

## 2023-11-30 ENCOUNTER — Other Ambulatory Visit: Payer: Self-pay | Admitting: Internal Medicine

## 2023-11-30 ENCOUNTER — Other Ambulatory Visit (HOSPITAL_COMMUNITY): Payer: Self-pay

## 2023-11-30 DIAGNOSIS — B20 Human immunodeficiency virus [HIV] disease: Secondary | ICD-10-CM

## 2023-11-30 MED ORDER — TRIUMEQ 600-50-300 MG PO TABS
1.0000 | ORAL_TABLET | Freq: Every day | ORAL | 0 refills | Status: DC
Start: 2023-11-30 — End: 2023-12-15
  Filled 2023-11-30: qty 30, 30d supply, fill #0

## 2023-11-30 NOTE — Progress Notes (Signed)
 Specialty Pharmacy Refill Coordination Note  Nicole Silva is a 54 y.o. female contacted today regarding refills of specialty medication(s) Abacavir-Dolutegravir-Lamivud (Triumeq)   Patient requested Delivery   Delivery date: 12/08/23   Verified address: [address]   [name] [address]   Medication will be filled on 12/07/23, pending refill approval.     This fill date is pending response to refill request from provider. Patient is aware and if they have not received fill by intended date they must follow up with pharmacy.

## 2023-12-07 ENCOUNTER — Other Ambulatory Visit: Payer: Self-pay

## 2023-12-15 ENCOUNTER — Other Ambulatory Visit: Payer: Self-pay

## 2023-12-15 ENCOUNTER — Other Ambulatory Visit (HOSPITAL_COMMUNITY): Payer: Self-pay

## 2023-12-15 ENCOUNTER — Ambulatory Visit: Payer: MEDICAID | Admitting: Internal Medicine

## 2023-12-15 ENCOUNTER — Other Ambulatory Visit (HOSPITAL_COMMUNITY)
Admission: RE | Admit: 2023-12-15 | Discharge: 2023-12-15 | Disposition: A | Discharge status: Home / Self Care | Payer: MEDICAID | Source: Ambulatory Visit | Attending: Internal Medicine | Admitting: Internal Medicine

## 2023-12-15 VITALS — BP 115/73 | HR 80 | Temp 98.0°F | Ht 65.5 in | Wt 196.0 lb

## 2023-12-15 DIAGNOSIS — B20 Human immunodeficiency virus [HIV] disease: Secondary | ICD-10-CM

## 2023-12-15 DIAGNOSIS — Z21 Asymptomatic human immunodeficiency virus [HIV] infection status: Secondary | ICD-10-CM | POA: Diagnosis not present

## 2023-12-15 MED ORDER — TRIUMEQ 600-50-300 MG PO TABS
1.0000 | ORAL_TABLET | Freq: Every day | ORAL | 6 refills | Status: DC
  Filled 2023-12-15 – 2024-01-03 (×2): qty 30, 30d supply, fill #0
  Filled 2024-01-30: qty 30, 30d supply, fill #1
  Filled 2024-02-28: qty 30, 30d supply, fill #2
  Filled 2024-03-23: qty 30, 30d supply, fill #3
  Filled 2024-04-18: qty 30, 30d supply, fill #4
  Filled 2024-05-21: qty 30, 30d supply, fill #5
  Filled 2024-06-15: qty 30, 30d supply, fill #6
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
## 2023-12-16 LAB — T-HELPER CELLS (CD4) COUNT (NOT AT ARMC)
CD4 % Helper T Cell: 12 % — ABNORMAL LOW (ref 33–65)
CD4 T Cell Abs: 292 /uL — ABNORMAL LOW (ref 400–1790)

## 2023-12-16 LAB — URINE CYTOLOGY ANCILLARY ONLY
Chlamydia: NEGATIVE
Comment: NEGATIVE
Comment: NORMAL
Neisseria Gonorrhea: NEGATIVE

## 2023-12-18 LAB — COMPLETE METABOLIC PANEL WITHOUT GFR
AG Ratio: 1.3 (calc) (ref 1.0–2.5)
ALT: 17 U/L (ref 6–29)
AST: 15 U/L (ref 10–35)
Albumin: 4.1 g/dL (ref 3.6–5.1)
Alkaline phosphatase (APISO): 73 U/L (ref 37–153)
BUN: 22 mg/dL (ref 7–25)
CO2: 26 mmol/L (ref 20–32)
Calcium: 9.4 mg/dL (ref 8.6–10.4)
Chloride: 102 mmol/L (ref 98–110)
Creat: 0.74 mg/dL (ref 0.50–1.03)
Globulin: 3.1 g/dL (ref 1.9–3.7)
Glucose, Bld: 124 mg/dL — ABNORMAL HIGH (ref 65–99)
Potassium: 4.1 mmol/L (ref 3.5–5.3)
Sodium: 137 mmol/L (ref 135–146)
Total Bilirubin: 0.3 mg/dL (ref 0.2–1.2)
Total Protein: 7.2 g/dL (ref 6.1–8.1)

## 2023-12-18 LAB — CBC WITH DIFFERENTIAL/PLATELET
Absolute Lymphocytes: 2749 {cells}/uL (ref 850–3900)
Absolute Monocytes: 514 {cells}/uL (ref 200–950)
Basophils Absolute: 47 {cells}/uL (ref 0–200)
Basophils Relative: 0.6 %
Eosinophils Absolute: 134 {cells}/uL (ref 15–500)
Eosinophils Relative: 1.7 %
HCT: 42.5 % (ref 35.0–45.0)
Hemoglobin: 14.7 g/dL (ref 11.7–15.5)
MCH: 34.8 pg — ABNORMAL HIGH (ref 27.0–33.0)
MCHC: 34.6 g/dL (ref 32.0–36.0)
MCV: 100.5 fL — ABNORMAL HIGH (ref 80.0–100.0)
MPV: 9.5 fL (ref 7.5–12.5)
Monocytes Relative: 6.5 %
Neutro Abs: 4456 {cells}/uL (ref 1500–7800)
Neutrophils Relative %: 56.4 %
Platelets: 323 10*3/uL (ref 140–400)
RBC: 4.23 10*6/uL (ref 3.80–5.10)
RDW: 14.2 % (ref 11.0–15.0)
Total Lymphocyte: 34.8 %
WBC: 7.9 10*3/uL (ref 3.8–10.8)

## 2023-12-18 LAB — QUANTIFERON-TB GOLD PLUS
Mitogen-NIL: 1.87 [IU]/mL
NIL: 0.02 [IU]/mL
QuantiFERON-TB Gold Plus: NEGATIVE
TB1-NIL: 0.01 [IU]/mL
TB2-NIL: 0.25 [IU]/mL

## 2023-12-18 LAB — RPR TITER: RPR Titer: 1:2 {titer} — ABNORMAL HIGH

## 2023-12-18 LAB — HEPATITIS C RNA QUANTITATIVE
HCV Quantitative Log: <1.18 {Log_IU}/mL
HCV RNA, PCR, QN: <15 [IU]/mL

## 2023-12-18 LAB — HIV-1 RNA QUANT-NO REFLEX-BLD
HIV 1 RNA Quant: NOT DETECTED {copies}/mL
HIV-1 RNA Quant, Log: NOT DETECTED {Log_copies}/mL

## 2023-12-18 LAB — RPR: RPR Ser Ql: REACTIVE — AB

## 2023-12-18 LAB — T PALLIDUM AB: T Pallidum Abs: NEGATIVE

## 2024-01-03 ENCOUNTER — Other Ambulatory Visit (HOSPITAL_COMMUNITY): Payer: Self-pay

## 2024-01-03 ENCOUNTER — Other Ambulatory Visit: Payer: Self-pay

## 2024-01-03 ENCOUNTER — Other Ambulatory Visit: Payer: Self-pay | Admitting: Pharmacy Technician

## 2024-01-03 NOTE — Progress Notes (Signed)
 Specialty Pharmacy Refill Coordination Note  Nicole Silva is a 53 y.o. female contacted today regarding refills of specialty medication(s) Abacavir -Dolutegravir -Lamivud (Triumeq )   Patient requested Delivery   Delivery date: 01/06/24   Verified address: [address]  [place]   Medication will be filled on 01/05/24.

## 2024-01-12 NOTE — Progress Notes (Signed)
 The ASCVD Risk score (Arnett DK, et al., 2019) failed to calculate for the following reasons:   Cannot find a previous HDL lab   Cannot find a previous total cholesterol lab  Arlon Bergamo, BSN, RN

## 2024-01-30 ENCOUNTER — Other Ambulatory Visit: Payer: Self-pay

## 2024-01-30 NOTE — Progress Notes (Signed)
 Specialty Pharmacy Ongoing Clinical Assessment Note  Nicole Silva is a 53 y.o. female who is being followed by the specialty pharmacy service for RxSp HIV   Patient's specialty medication(s) reviewed today: Abacavir -Dolutegravir -Lamivud (Triumeq )   Missed doses in the last 4 weeks: 0   Patient/Caregiver did not have any additional questions or concerns.   Therapeutic benefit summary: Patient is achieving benefit   Adverse events/side effects summary: No adverse events/side effects   Patient's therapy is appropriate to: Continue    Goals Addressed             This Visit's Progress    Achieve Undetectable HIV Viral Load < 20   On track    Patient is on track. Patient will maintain adherence. Patient's viral load remains undetected long term        Follow up: 6 months  [facility]

## 2024-01-30 NOTE — Progress Notes (Signed)
 Specialty Pharmacy Refill Coordination Note  Nicole Silva is a 53 y.o. female contacted today regarding refills of specialty medication(s) Abacavir -Dolutegravir -Lamivud (Triumeq )   Patient requested Delivery   Delivery date: 02/02/24   Verified address: [address]  [place]   Medication will be filled on 02/01/24.

## 2024-02-23 ENCOUNTER — Other Ambulatory Visit: Payer: Self-pay

## 2024-02-28 ENCOUNTER — Other Ambulatory Visit: Payer: Self-pay

## 2024-02-28 NOTE — Progress Notes (Signed)
 Specialty Pharmacy Refill Coordination Note  Nicole Silva is a 53 y.o. female contacted today regarding refills of specialty medication(s) Abacavir -Dolutegravir -Lamivud (Triumeq )   Patient requested Delivery   Delivery date: 02/29/24   Verified address: [address]  [place]   Medication will be filled on 02/28/24.

## 2024-03-23 ENCOUNTER — Other Ambulatory Visit: Payer: Self-pay

## 2024-03-23 NOTE — Progress Notes (Signed)
 Specialty Pharmacy Refill Coordination Note  Nicole Silva is a 53 y.o. female contacted today regarding refills of specialty medication(s) Abacavir -Dolutegravir -Lamivud (Triumeq )   Patient requested Delivery   Delivery date: 03/27/24   Verified address: [address]  [place]   Medication will be filled on 03/26/24.

## 2024-04-18 ENCOUNTER — Other Ambulatory Visit: Payer: Self-pay

## 2024-04-18 NOTE — Progress Notes (Signed)
 Specialty Pharmacy Refill Coordination Note  Nicole Silva is a 53 y.o. female contacted today regarding refills of specialty medication(s) Abacavir -Dolutegravir -Lamivud (Triumeq )   Patient requested Delivery   Delivery date: 04/20/24   Verified address: [address]  [place]   Medication will be filled on 04/19/24.

## 2024-05-10 ENCOUNTER — Other Ambulatory Visit (HOSPITAL_COMMUNITY): Payer: Self-pay

## 2024-05-18 ENCOUNTER — Other Ambulatory Visit: Payer: Self-pay

## 2024-05-21 ENCOUNTER — Other Ambulatory Visit: Payer: Self-pay

## 2024-05-21 ENCOUNTER — Other Ambulatory Visit (HOSPITAL_COMMUNITY): Payer: Self-pay

## 2024-05-21 NOTE — Progress Notes (Signed)
 Specialty Pharmacy Refill Coordination Note  Nicole Silva is a 53 y.o. female contacted today regarding refills of specialty medication(s) Abacavir -Dolutegravir -Lamivud (Triumeq )   Patient requested Delivery   Delivery date: 05/25/24   Verified address: [address]  [place]   Medication will be filled on 05/24/24.

## 2024-05-23 ENCOUNTER — Other Ambulatory Visit: Payer: Self-pay

## 2024-06-07 ENCOUNTER — Ambulatory Visit: Payer: MEDICAID | Admitting: Infectious Diseases

## 2024-06-15 ENCOUNTER — Other Ambulatory Visit: Payer: Self-pay

## 2024-06-15 NOTE — Progress Notes (Signed)
 Specialty Pharmacy Refill Coordination Note  Nicole Silva is a 53 y.o. female contacted today regarding refills of specialty medication(s) Abacavir -Dolutegravir -Lamivud (Triumeq )   Patient requested Delivery   Delivery date: 06/18/24   Verified address: [address]  [place]    Medication will be filled on 06/15/24.

## 2024-06-19 ENCOUNTER — Ambulatory Visit: Payer: MEDICAID | Admitting: Infectious Diseases

## 2024-07-06 ENCOUNTER — Other Ambulatory Visit: Payer: Self-pay | Admitting: Internal Medicine

## 2024-07-06 ENCOUNTER — Other Ambulatory Visit (HOSPITAL_COMMUNITY): Payer: Self-pay

## 2024-07-06 DIAGNOSIS — B20 Human immunodeficiency virus [HIV] disease: Secondary | ICD-10-CM

## 2024-07-09 ENCOUNTER — Other Ambulatory Visit (HOSPITAL_COMMUNITY): Payer: Self-pay

## 2024-07-09 ENCOUNTER — Other Ambulatory Visit: Payer: Self-pay

## 2024-07-09 MED ORDER — TRIUMEQ 600-50-300 MG PO TABS
1.0000 | ORAL_TABLET | Freq: Every day | ORAL | 1 refills | Status: DC
  Filled 2024-07-09 – 2024-07-13 (×2): qty 30, 30d supply, fill #0
  Filled 2024-08-08: qty 30, 30d supply, fill #1

## 2024-07-12 ENCOUNTER — Telehealth: Payer: Self-pay

## 2024-07-12 NOTE — Telephone Encounter (Signed)
 Have called patient multiple times unable to leave a voice mail. Phone is answered and then dropped. Home number is disconnected.

## 2024-07-13 ENCOUNTER — Other Ambulatory Visit: Payer: Self-pay

## 2024-07-13 ENCOUNTER — Other Ambulatory Visit: Payer: Self-pay | Admitting: Pharmacy Technician

## 2024-07-13 NOTE — Progress Notes (Signed)
 Specialty Pharmacy Refill Coordination Note  Nicole Silva is a 53 y.o. female contacted today regarding refills of specialty medication(s) Abacavir -Dolutegravir -Lamivud (Triumeq )   Patient requested Delivery   Delivery date: 07/17/24   Verified address: [address]  [name]   Medication will be filled on: 07/16/24

## 2024-07-16 ENCOUNTER — Other Ambulatory Visit: Payer: Self-pay

## 2024-07-24 ENCOUNTER — Other Ambulatory Visit: Payer: Self-pay

## 2024-08-08 ENCOUNTER — Other Ambulatory Visit: Payer: Self-pay

## 2024-08-08 NOTE — Progress Notes (Signed)
 Specialty Pharmacy Refill Coordination Note  Nicole Silva is a 53 y.o. female contacted today regarding refills of specialty medication(s) Abacavir -Dolutegravir -Lamivud (Triumeq )   Patient requested Delivery   Delivery date: 08/17/24   Verified address: [address]  [name]   Medication will be filled on: 08/16/24

## 2024-08-16 ENCOUNTER — Encounter: Payer: Self-pay | Admitting: Internal Medicine

## 2024-08-16 ENCOUNTER — Other Ambulatory Visit: Payer: Self-pay

## 2024-08-16 ENCOUNTER — Ambulatory Visit: Payer: MEDICAID | Admitting: Internal Medicine

## 2024-08-16 ENCOUNTER — Other Ambulatory Visit (HOSPITAL_COMMUNITY): Payer: Self-pay

## 2024-08-16 VITALS — BP 119/82 | HR 84 | Ht 65.5 in | Wt 226.0 lb

## 2024-08-16 DIAGNOSIS — Z23 Encounter for immunization: Secondary | ICD-10-CM

## 2024-08-16 DIAGNOSIS — B20 Human immunodeficiency virus [HIV] disease: Secondary | ICD-10-CM

## 2024-08-16 MED ORDER — TRIUMEQ 600-50-300 MG PO TABS
1.0000 | ORAL_TABLET | Freq: Every day | ORAL | 5 refills | Status: AC
  Filled 2024-08-16 – 2024-09-07 (×2): qty 30, 30d supply, fill #0
  Filled 2024-10-04: qty 30, 30d supply, fill #1
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
## 2024-08-17 LAB — T-HELPER CELLS (CD4) COUNT (NOT AT ARMC)
CD4 % Helper T Cell: 13 % — ABNORMAL LOW (ref 33–65)
CD4 T Cell Abs: 273 /uL — ABNORMAL LOW (ref 400–1790)

## 2024-08-18 LAB — COMPLETE METABOLIC PANEL WITHOUT GFR
AG Ratio: 1.2 (calc) (ref 1.0–2.5)
ALT: 19 U/L (ref 6–29)
AST: 13 U/L (ref 10–35)
Albumin: 3.8 g/dL (ref 3.6–5.1)
Alkaline phosphatase (APISO): 85 U/L (ref 37–153)
BUN: 19 mg/dL (ref 7–25)
CO2: 23 mmol/L (ref 20–32)
Calcium: 9.4 mg/dL (ref 8.6–10.4)
Chloride: 104 mmol/L (ref 98–110)
Creat: 0.78 mg/dL (ref 0.50–1.03)
Globulin: 3.3 g/dL (ref 1.9–3.7)
Glucose, Bld: 139 mg/dL — ABNORMAL HIGH (ref 65–99)
Potassium: 4.3 mmol/L (ref 3.5–5.3)
Sodium: 136 mmol/L (ref 135–146)
Total Bilirubin: 0.2 mg/dL (ref 0.2–1.2)
Total Protein: 7.1 g/dL (ref 6.1–8.1)

## 2024-08-18 LAB — CBC WITH DIFFERENTIAL/PLATELET
Absolute Lymphocytes: 2471 {cells}/uL (ref 850–3900)
Absolute Monocytes: 454 {cells}/uL (ref 200–950)
Basophils Absolute: 41 {cells}/uL (ref 0–200)
Basophils Relative: 0.5 %
Eosinophils Absolute: 138 {cells}/uL (ref 15–500)
Eosinophils Relative: 1.7 %
HCT: 42.6 % (ref 35.9–46.0)
Hemoglobin: 14.8 g/dL (ref 11.7–15.5)
MCH: 34.7 pg — ABNORMAL HIGH (ref 27.0–33.0)
MCHC: 34.7 g/dL (ref 31.6–35.4)
MCV: 99.8 fL (ref 81.4–101.7)
MPV: 9.9 fL (ref 7.5–12.5)
Monocytes Relative: 5.6 %
Neutro Abs: 4998 {cells}/uL (ref 1500–7800)
Neutrophils Relative %: 61.7 %
Platelets: 318 Thousand/uL (ref 140–400)
RBC: 4.27 Million/uL (ref 3.80–5.10)
RDW: 13.7 % (ref 11.0–15.0)
Total Lymphocyte: 30.5 %
WBC: 8.1 Thousand/uL (ref 3.8–10.8)

## 2024-08-18 LAB — HIV-1 RNA QUANT-NO REFLEX-BLD
HIV 1 RNA Quant: NOT DETECTED {copies}/mL
HIV-1 RNA Quant, Log: NOT DETECTED {Log_copies}/mL

## 2024-09-07 ENCOUNTER — Other Ambulatory Visit: Payer: Self-pay

## 2024-09-07 ENCOUNTER — Other Ambulatory Visit: Payer: Self-pay | Admitting: Pharmacy Technician

## 2024-09-07 NOTE — Progress Notes (Signed)
 Specialty Pharmacy Refill Coordination Note  Nicole Silva is a 53 y.o. female contacted today regarding refills of specialty medication(s) Abacavir -Dolutegravir -Lamivud (Triumeq )   Patient requested Delivery   Delivery date: 09/12/24   Verified address: [address] [place]   Medication will be filled on: 09/11/24

## 2024-10-04 ENCOUNTER — Other Ambulatory Visit: Payer: Self-pay

## 2024-10-04 NOTE — Progress Notes (Signed)
 Specialty Pharmacy Refill Coordination Note  Nicole Silva is a 54 y.o. female contacted today regarding refills of specialty medication(s) Abacavir -Dolutegravir -Lamivud (Triumeq )   Patient requested Delivery   Delivery date: 10/11/24   Verified address: [address] [place]   Medication will be filled on: 10/10/24

## 2024-10-10 ENCOUNTER — Other Ambulatory Visit: Payer: Self-pay
